# Patient Record
Sex: Male | Born: 1960 | Race: White | Hispanic: No | Marital: Single | Smoking: Former smoker
Health system: Southern US, Community
[De-identification: ages and names within clinical notes are randomized; demographics above are authoritative.]

## PROBLEM LIST (undated history)

## (undated) DIAGNOSIS — F17200 Nicotine dependence, unspecified, uncomplicated: Secondary | ICD-10-CM

## (undated) DIAGNOSIS — J449 Chronic obstructive pulmonary disease, unspecified: Secondary | ICD-10-CM

## (undated) DIAGNOSIS — G4730 Sleep apnea, unspecified: Secondary | ICD-10-CM

## (undated) DIAGNOSIS — I10 Essential (primary) hypertension: Secondary | ICD-10-CM

## (undated) DIAGNOSIS — I509 Heart failure, unspecified: Secondary | ICD-10-CM

## (undated) HISTORY — PX: WRIST SURGERY: SHX841

## (undated) HISTORY — PX: OTHER SURGICAL HISTORY: SHX169

---

## 2015-08-01 DIAGNOSIS — I509 Heart failure, unspecified: Secondary | ICD-10-CM

## 2015-08-01 HISTORY — DX: Heart failure, unspecified: I50.9

## 2015-08-13 ENCOUNTER — Inpatient Hospital Stay (HOSPITAL_COMMUNITY): Payer: Self-pay

## 2015-08-13 ENCOUNTER — Inpatient Hospital Stay (HOSPITAL_BASED_OUTPATIENT_CLINIC_OR_DEPARTMENT_OTHER)
Admission: EM | Admit: 2015-08-13 | Discharge: 2015-08-20 | DRG: 291 | Disposition: A | Discharge status: Home / Self Care | Payer: Self-pay | Attending: Internal Medicine | Admitting: Internal Medicine

## 2015-08-13 ENCOUNTER — Emergency Department (HOSPITAL_BASED_OUTPATIENT_CLINIC_OR_DEPARTMENT_OTHER): Payer: Self-pay

## 2015-08-13 ENCOUNTER — Encounter (HOSPITAL_BASED_OUTPATIENT_CLINIC_OR_DEPARTMENT_OTHER): Payer: Self-pay

## 2015-08-13 DIAGNOSIS — I272 Other secondary pulmonary hypertension: Secondary | ICD-10-CM | POA: Diagnosis present

## 2015-08-13 DIAGNOSIS — I509 Heart failure, unspecified: Secondary | ICD-10-CM | POA: Diagnosis present

## 2015-08-13 DIAGNOSIS — J9601 Acute respiratory failure with hypoxia: Secondary | ICD-10-CM | POA: Diagnosis present

## 2015-08-13 DIAGNOSIS — M7989 Other specified soft tissue disorders: Secondary | ICD-10-CM | POA: Diagnosis present

## 2015-08-13 DIAGNOSIS — J9811 Atelectasis: Secondary | ICD-10-CM | POA: Diagnosis present

## 2015-08-13 DIAGNOSIS — N5089 Other specified disorders of the male genital organs: Secondary | ICD-10-CM | POA: Diagnosis present

## 2015-08-13 DIAGNOSIS — I5030 Unspecified diastolic (congestive) heart failure: Secondary | ICD-10-CM

## 2015-08-13 DIAGNOSIS — R0600 Dyspnea, unspecified: Secondary | ICD-10-CM

## 2015-08-13 DIAGNOSIS — J449 Chronic obstructive pulmonary disease, unspecified: Secondary | ICD-10-CM | POA: Diagnosis present

## 2015-08-13 DIAGNOSIS — F17210 Nicotine dependence, cigarettes, uncomplicated: Secondary | ICD-10-CM | POA: Diagnosis present

## 2015-08-13 DIAGNOSIS — R7989 Other specified abnormal findings of blood chemistry: Secondary | ICD-10-CM

## 2015-08-13 DIAGNOSIS — F17200 Nicotine dependence, unspecified, uncomplicated: Secondary | ICD-10-CM | POA: Diagnosis present

## 2015-08-13 DIAGNOSIS — I5032 Chronic diastolic (congestive) heart failure: Secondary | ICD-10-CM

## 2015-08-13 DIAGNOSIS — Z79899 Other long term (current) drug therapy: Secondary | ICD-10-CM

## 2015-08-13 DIAGNOSIS — I50811 Acute right heart failure: Secondary | ICD-10-CM

## 2015-08-13 DIAGNOSIS — I472 Ventricular tachycardia: Secondary | ICD-10-CM | POA: Diagnosis present

## 2015-08-13 DIAGNOSIS — Z881 Allergy status to other antibiotic agents status: Secondary | ICD-10-CM

## 2015-08-13 DIAGNOSIS — Z8249 Family history of ischemic heart disease and other diseases of the circulatory system: Secondary | ICD-10-CM

## 2015-08-13 DIAGNOSIS — I878 Other specified disorders of veins: Secondary | ICD-10-CM | POA: Diagnosis present

## 2015-08-13 DIAGNOSIS — E662 Morbid (severe) obesity with alveolar hypoventilation: Secondary | ICD-10-CM | POA: Diagnosis present

## 2015-08-13 DIAGNOSIS — Z885 Allergy status to narcotic agent status: Secondary | ICD-10-CM

## 2015-08-13 DIAGNOSIS — G4730 Sleep apnea, unspecified: Secondary | ICD-10-CM | POA: Diagnosis present

## 2015-08-13 DIAGNOSIS — I110 Hypertensive heart disease with heart failure: Principal | ICD-10-CM | POA: Diagnosis present

## 2015-08-13 DIAGNOSIS — R0902 Hypoxemia: Secondary | ICD-10-CM

## 2015-08-13 DIAGNOSIS — I2781 Cor pulmonale (chronic): Secondary | ICD-10-CM | POA: Diagnosis present

## 2015-08-13 DIAGNOSIS — I2510 Atherosclerotic heart disease of native coronary artery without angina pectoris: Secondary | ICD-10-CM | POA: Diagnosis present

## 2015-08-13 DIAGNOSIS — J9612 Chronic respiratory failure with hypercapnia: Secondary | ICD-10-CM

## 2015-08-13 DIAGNOSIS — J9691 Respiratory failure, unspecified with hypoxia: Secondary | ICD-10-CM | POA: Diagnosis present

## 2015-08-13 DIAGNOSIS — I493 Ventricular premature depolarization: Secondary | ICD-10-CM | POA: Diagnosis present

## 2015-08-13 DIAGNOSIS — Z6841 Body Mass Index (BMI) 40.0 and over, adult: Secondary | ICD-10-CM

## 2015-08-13 DIAGNOSIS — J9611 Chronic respiratory failure with hypoxia: Secondary | ICD-10-CM | POA: Insufficient documentation

## 2015-08-13 DIAGNOSIS — R778 Other specified abnormalities of plasma proteins: Secondary | ICD-10-CM

## 2015-08-13 DIAGNOSIS — I4729 Other ventricular tachycardia: Secondary | ICD-10-CM

## 2015-08-13 HISTORY — DX: Sleep apnea, unspecified: G47.30

## 2015-08-13 HISTORY — DX: Nicotine dependence, unspecified, uncomplicated: F17.200

## 2015-08-13 HISTORY — DX: Morbid (severe) obesity due to excess calories: E66.01

## 2015-08-13 HISTORY — DX: Heart failure, unspecified: I50.9

## 2015-08-13 LAB — CBC WITH DIFFERENTIAL/PLATELET
BASOS ABS: 0 10*3/uL (ref 0.0–0.1)
BASOS ABS: 0 10*3/uL (ref 0.0–0.1)
Basophils Relative: 0 %
Basophils Relative: 0 %
EOS PCT: 1 %
Eosinophils Absolute: 0.1 10*3/uL (ref 0.0–0.7)
Eosinophils Absolute: 0.1 10*3/uL (ref 0.0–0.7)
Eosinophils Relative: 2 %
HCT: 55.2 % — ABNORMAL HIGH (ref 39.0–52.0)
HEMATOCRIT: 57.1 % — AB (ref 39.0–52.0)
HEMOGLOBIN: 17.2 g/dL — AB (ref 13.0–17.0)
HEMOGLOBIN: 17.7 g/dL — AB (ref 13.0–17.0)
LYMPHS ABS: 1.1 10*3/uL (ref 0.7–4.0)
LYMPHS PCT: 14 %
LYMPHS PCT: 22 %
Lymphs Abs: 1.8 10*3/uL (ref 0.7–4.0)
MCH: 30.1 pg (ref 26.0–34.0)
MCH: 29.8 pg (ref 26.0–34.0)
MCHC: 31.2 g/dL (ref 30.0–36.0)
MCHC: 31 g/dL (ref 30.0–36.0)
MCV: 96.7 fL (ref 78.0–100.0)
MCV: 96.1 fL (ref 78.0–100.0)
MONO ABS: 0.8 10*3/uL (ref 0.1–1.0)
MONOS PCT: 10 %
Monocytes Absolute: 0.7 10*3/uL (ref 0.1–1.0)
Monocytes Relative: 10 %
NEUTROS ABS: 5.3 10*3/uL (ref 1.7–7.7)
NEUTROS PCT: 75 %
NEUTROS PCT: 66 %
Neutro Abs: 5.8 10*3/uL (ref 1.7–7.7)
PLATELETS: 166 10*3/uL (ref 150–400)
Platelets: 148 10*3/uL — ABNORMAL LOW (ref 150–400)
RBC: 5.71 MIL/uL (ref 4.22–5.81)
RBC: 5.94 MIL/uL — ABNORMAL HIGH (ref 4.22–5.81)
RDW: 16.3 % — ABNORMAL HIGH (ref 11.5–15.5)
RDW: 15.7 % — AB (ref 11.5–15.5)
WBC: 7.7 10*3/uL (ref 4.0–10.5)
WBC: 8.1 10*3/uL (ref 4.0–10.5)

## 2015-08-13 LAB — URINALYSIS, ROUTINE W REFLEX MICROSCOPIC
Bilirubin Urine: NEGATIVE
Glucose, UA: NEGATIVE mg/dL
Hgb urine dipstick: NEGATIVE
Ketones, ur: NEGATIVE mg/dL
LEUKOCYTES UA: NEGATIVE
Nitrite: NEGATIVE
SPECIFIC GRAVITY, URINE: 1.021 (ref 1.005–1.030)
pH: 6.5 (ref 5.0–8.0)

## 2015-08-13 LAB — BRAIN NATRIURETIC PEPTIDE
B Natriuretic Peptide: 340.4 pg/mL — ABNORMAL HIGH (ref 0.0–100.0)
B Natriuretic Peptide: 224.6 pg/mL — ABNORMAL HIGH (ref 0.0–100.0)

## 2015-08-13 LAB — URINE MICROSCOPIC-ADD ON
RBC / HPF: NONE SEEN RBC/hpf (ref 0–5)
WBC UA: NONE SEEN WBC/hpf (ref 0–5)

## 2015-08-13 LAB — COMPREHENSIVE METABOLIC PANEL
ALBUMIN: 3.2 g/dL — AB (ref 3.5–5.0)
ALT: 16 U/L — ABNORMAL LOW (ref 17–63)
ALT: 17 U/L (ref 17–63)
ANION GAP: 7 (ref 5–15)
AST: 18 U/L (ref 15–41)
AST: 19 U/L (ref 15–41)
Albumin: 3.4 g/dL — ABNORMAL LOW (ref 3.5–5.0)
Alkaline Phosphatase: 59 U/L (ref 38–126)
Alkaline Phosphatase: 62 U/L (ref 38–126)
Anion gap: 9 (ref 5–15)
BILIRUBIN TOTAL: 0.8 mg/dL (ref 0.3–1.2)
BUN: 16 mg/dL (ref 6–20)
BUN: 11 mg/dL (ref 6–20)
CHLORIDE: 100 mmol/L — AB (ref 101–111)
CHLORIDE: 96 mmol/L — AB (ref 101–111)
CO2: 34 mmol/L — ABNORMAL HIGH (ref 22–32)
CO2: 34 mmol/L — AB (ref 22–32)
CREATININE: 0.96 mg/dL (ref 0.61–1.24)
Calcium: 8.6 mg/dL — ABNORMAL LOW (ref 8.9–10.3)
Calcium: 8.9 mg/dL (ref 8.9–10.3)
Creatinine, Ser: 0.93 mg/dL (ref 0.61–1.24)
GFR calc Af Amer: >60 mL/min (ref >60)
GFR calc non Af Amer: >60 mL/min (ref >60)
Glucose, Bld: 97 mg/dL (ref 65–99)
Glucose, Bld: 86 mg/dL (ref 65–99)
POTASSIUM: 4.1 mmol/L (ref 3.5–5.1)
Potassium: 3.7 mmol/L (ref 3.5–5.1)
SODIUM: 139 mmol/L (ref 135–145)
Sodium: 141 mmol/L (ref 135–145)
TOTAL PROTEIN: 6.7 g/dL (ref 6.5–8.1)
Total Bilirubin: 1.1 mg/dL (ref 0.3–1.2)
Total Protein: 6.7 g/dL (ref 6.5–8.1)

## 2015-08-13 LAB — PROTIME-INR
INR: 1.16 (ref 0.00–1.49)
Prothrombin Time: 15 seconds (ref 11.6–15.2)

## 2015-08-13 LAB — TSH: TSH: 4.308 u[IU]/mL (ref 0.350–4.500)

## 2015-08-13 LAB — MAGNESIUM: Magnesium: 1.1 mg/dL — ABNORMAL LOW (ref 1.7–2.4)

## 2015-08-13 LAB — CK TOTAL AND CKMB (NOT AT ARMC)
CK TOTAL: 73 U/L (ref 49–397)
CK, MB: 3.6 ng/mL (ref 0.5–5.0)
Relative Index: INVALID (ref 0.0–2.5)

## 2015-08-13 LAB — TROPONIN I
TROPONIN I: 0.07 ng/mL — AB (ref <0.031)
TROPONIN I: 0.07 ng/mL — AB (ref <0.031)

## 2015-08-13 LAB — PHOSPHORUS: Phosphorus: 3.7 mg/dL (ref 2.5–4.6)

## 2015-08-13 MED ORDER — SODIUM CHLORIDE 0.9% FLUSH
3.0000 mL | INTRAVENOUS | Status: DC | PRN
Start: 2015-08-13 — End: 2015-08-20

## 2015-08-13 MED ORDER — FUROSEMIDE 10 MG/ML IJ SOLN
40.0000 mg | INTRAMUSCULAR | Status: AC
  Administered 2015-08-13: 40 mg via INTRAVENOUS
  Filled 2015-08-13: qty 4

## 2015-08-13 MED ORDER — ASPIRIN EC 81 MG PO TBEC
81.0000 mg | DELAYED_RELEASE_TABLET | Freq: Every day | ORAL | Status: DC
  Administered 2015-08-13 – 2015-08-20 (×8): 81 mg via ORAL
  Filled 2015-08-13 (×8): qty 1

## 2015-08-13 MED ORDER — HEPARIN SODIUM (PORCINE) 5000 UNIT/ML IJ SOLN
5000.0000 [IU] | Freq: Three times a day (TID) | INTRAMUSCULAR | Status: DC
  Administered 2015-08-13 – 2015-08-16 (×7): 5000 [IU] via SUBCUTANEOUS
  Filled 2015-08-13 (×7): qty 1

## 2015-08-13 MED ORDER — FUROSEMIDE 10 MG/ML IJ SOLN
40.0000 mg | Freq: Two times a day (BID) | INTRAMUSCULAR | Status: DC
  Administered 2015-08-13 – 2015-08-15 (×4): 40 mg via INTRAVENOUS
  Filled 2015-08-13 (×4): qty 4

## 2015-08-13 MED ORDER — ASPIRIN 81 MG PO CHEW
324.0000 mg | CHEWABLE_TABLET | Freq: Once | ORAL | Status: AC
  Administered 2015-08-13: 324 mg via ORAL
  Filled 2015-08-13: qty 4

## 2015-08-13 MED ORDER — IPRATROPIUM-ALBUTEROL 0.5-2.5 (3) MG/3ML IN SOLN
3.0000 mL | Freq: Once | RESPIRATORY_TRACT | Status: AC
  Administered 2015-08-13: 3 mL via RESPIRATORY_TRACT
  Filled 2015-08-13: qty 3

## 2015-08-13 MED ORDER — SODIUM CHLORIDE 0.9% FLUSH
3.0000 mL | Freq: Two times a day (BID) | INTRAVENOUS | Status: DC
  Administered 2015-08-13 – 2015-08-19 (×9): 3 mL via INTRAVENOUS

## 2015-08-13 MED ORDER — SODIUM CHLORIDE 0.9% FLUSH
3.0000 mL | Freq: Two times a day (BID) | INTRAVENOUS | Status: DC
  Administered 2015-08-13 – 2015-08-20 (×9): 3 mL via INTRAVENOUS

## 2015-08-13 MED ORDER — IPRATROPIUM-ALBUTEROL 0.5-2.5 (3) MG/3ML IN SOLN: 3.0000 mL | RESPIRATORY_TRACT | Status: DC | PRN

## 2015-08-13 MED ORDER — IPRATROPIUM-ALBUTEROL 0.5-2.5 (3) MG/3ML IN SOLN
3.0000 mL | Freq: Four times a day (QID) | RESPIRATORY_TRACT | Status: DC
  Administered 2015-08-13 – 2015-08-15 (×6): 3 mL via RESPIRATORY_TRACT
  Filled 2015-08-13 (×7): qty 3

## 2015-08-13 MED ORDER — SODIUM CHLORIDE 0.9 % IV SOLN: 250.0000 mL | INTRAVENOUS | Status: DC | PRN

## 2015-08-13 NOTE — ED Provider Notes (Signed)
CSN: [id number]     Arrival date & time 08/13/15  1136 History   First MD Initiated Contact with Patient 08/13/15 1152     Chief Complaint  Patient presents with  . Leg Swelling    Kirk Lucas is a 55 y.o. male who presents to the emergency department complaining of bilateral leg swelling and increasing shortness of breath ongoing for the past 2-1/2 weeks. Patient reports he's had increased productive cough, dyspnea on exertion and bilateral leg swelling over the past 2 and half weeks. He also reports he's been having scrotal swelling without pain. He denies any pain to his penis or his testicles. He denies any urinary symptoms. Patient reports his weight here today is about 25 pounds heavier than he has ever seen it. Patient smoked for 37 years and quit several weeks ago. He is not followed by a primary care doctor and has never seen a cardiologist. He denies history of CHF, COPD, hyperlipidemia hypertension. He reports a family history of his mom and dad with heart attacks and heart failure. Patient denies fevers, hemoptysis, chest pain, abdominal pain, nausea, vomiting, diarrhea, rashes, penile pain, testicular pain, urinary symptoms.   The history is provided by the patient. No language interpreter was used.    History reviewed. No pertinent past medical history. Past Surgical History  Procedure Laterality Date  . Arm surgery    . Wrist surgery     History reviewed. No pertinent family history. Social History  Substance Use Topics  . Smoking status: Current Every Day Smoker  . Smokeless tobacco: None  . Alcohol Use: No    Review of Systems  Constitutional: Negative for fever and chills.  HENT: Negative for congestion and sore throat.   Eyes: Negative for visual disturbance.  Respiratory: Positive for cough, shortness of breath and wheezing.   Cardiovascular: Positive for leg swelling. Negative for chest pain and palpitations.  Gastrointestinal: Negative for nausea, vomiting,  abdominal pain and diarrhea.  Genitourinary: Positive for scrotal swelling. Negative for dysuria, urgency, frequency, hematuria, decreased urine volume, discharge, penile swelling, difficulty urinating, genital sores, penile pain and testicular pain.  Musculoskeletal: Negative for back pain and neck pain.  Skin: Negative for rash.  Neurological: Negative for headaches.      Allergies  Duricef  Home Medications   Prior to Admission medications   Medication Sig Start Date End Date Taking? Authorizing Provider  Amoxicillin (AMOXIL PO) Take by mouth.   Yes Historical Provider, MD  IBUPROFEN IB PO Take by mouth.   Yes Historical Provider, MD   BP 133/89 mmHg  Pulse 78  Temp(Src) 99.1 F (37.3 C) (Oral)  Resp 22  Ht 6' (1.829 m)  Wt 157.398 kg  BMI 47.05 kg/m2  SpO2 93% Physical Exam  Constitutional: He appears well-developed and well-nourished. No distress.  Nontoxic appearing. Obese male.  HENT:  Head: Normocephalic and atraumatic.  Mouth/Throat: Oropharynx is clear and moist.  Eyes: Conjunctivae are normal. Pupils are equal, round, and reactive to light. Right eye exhibits no discharge. Left eye exhibits no discharge.  Neck: Neck supple. No JVD present.  Cardiovascular: Normal rate, regular rhythm, normal heart sounds and intact distal pulses.  Exam reveals no gallop and no friction rub.   No murmur heard. Bilateral radial and dorsalis pedis pulses are palpable. Good capillary refill to his bilateral distal toes.  Pulmonary/Chest: Effort normal. No stridor. No respiratory distress. He has wheezes. He has no rales.  Diminished lung sounds his bilateral bases with scattered wheezing  diffusely. No increased work of breathing. Patient on oxygen via nasal cannula during my exam. Oxygen saturation 92% on 4 L via nasal cannula.  Abdominal: Soft. There is no tenderness. There is no guarding.  Abdomen is soft nontender palpation.  Genitourinary:  Edema noted diffusely to his scrotum.  No testicular tenderness to palpation. No tenderness of his scrotum. No scrotal erythema. No penile discharge.  Musculoskeletal: He exhibits edema.  Bilateral pitting lower extremity edema. No calf tenderness to palpation.    Lymphadenopathy:    He has no cervical adenopathy.  Neurological: He is alert. Coordination normal.  Skin: Skin is warm and dry. No rash noted. He is not diaphoretic. No erythema. No pallor.  Psychiatric: He has a normal mood and affect. His behavior is normal.  Nursing note and vitals reviewed.   ED Course  Procedures (including critical care time) Labs Review Labs Reviewed  COMPREHENSIVE METABOLIC PANEL - Abnormal; Notable for the following:    Chloride 100 (*)    CO2 34 (*)    Calcium 8.6 (*)    Albumin 3.4 (*)    ALT 16 (*)    All other components within normal limits  CBC WITH DIFFERENTIAL/PLATELET - Abnormal; Notable for the following:    Hemoglobin 17.2 (*)    HCT 55.2 (*)    RDW 16.3 (*)    All other components within normal limits  URINALYSIS, ROUTINE W REFLEX MICROSCOPIC (NOT AT [facility]) - Abnormal; Notable for the following:    Protein, ur >300 (*)    All other components within normal limits  TROPONIN I - Abnormal; Notable for the following:    Troponin I 0.07 (*)    All other components within normal limits  BRAIN NATRIURETIC PEPTIDE - Abnormal; Notable for the following:    B Natriuretic Peptide 340.4 (*)    All other components within normal limits  URINE MICROSCOPIC-ADD ON - Abnormal; Notable for the following:    Squamous Epithelial / LPF 0-5 (*)    Bacteria, UA MANY (*)    All other components within normal limits    Imaging Review Dg Chest 2 View  08/13/2015  CLINICAL DATA:  Cough and shortness of breath for 2 weeks EXAM: CHEST  2 VIEW COMPARISON:  None. FINDINGS: There is no edema or consolidation. Heart is upper normal in size with pulmonary vascularity within normal limits. No adenopathy. No pneumothorax. No bone lesions.  IMPRESSION: No edema or consolidation. Electronically Signed   By: Bretta Bang III M.D.   On: 08/13/2015 12:51   I have personally reviewed and evaluated these images and lab results as part of my medical decision-making.   EKG Interpretation   Date/Time:  Tuesday August 13 2015 11:55:46 EDT Ventricular Rate:  83 PR Interval:  156 QRS Duration: 83 QT Interval:  369 QTC Calculation: 434 R Axis:   102 Text Interpretation:  Sinus rhythm Right axis deviation Nonspecific T  abnormalities, anterior leads Baseline wander in lead(s) II III aVF  Confirmed by Fayrene Fearing  MD, MARK ([id number]) on 08/13/2015 12:00:14 PM Also  confirmed by Fayrene Fearing  MD, MARK ([id number]), editor [place]  CCT, BEVERLY  ([id number])  on 08/13/2015 12:44:15 PM     Filed Vitals:   08/13/15 1330 08/13/15 1345 08/13/15 1400 08/13/15 1415  BP:  130/92 132/85 133/89  Pulse: 79 77 79 78  Temp:      TempSrc:      Resp: Height:      Weight:  SpO2: 96% 95% 94% 93%    MDM   Meds given in ED:  Medications  ipratropium-albuterol (DUONEB) 0.5-2.5 (3) MG/3ML nebulizer solution 3 mL (3 mLs Nebulization Given 08/13/15 1224)  aspirin chewable tablet 324 mg (324 mg Oral Given 08/13/15 1328)  furosemide (LASIX) injection 40 mg (40 mg Intravenous Given 08/13/15 1412)    New Prescriptions   No medications on file    Final diagnoses:  Acute congestive heart failure, unspecified congestive heart failure type (HCC)  Hypoxia  Elevated troponin   This is a 55 y.o. male Who presents to the emergency department complaining of bilateral leg swelling and increasing shortness of breath ongoing for the past 2-1/2 weeks. Patient reports he's had increased productive cough, dyspnea on exertion and bilateral leg swelling over the past 2 and half weeks. He also reports he's been having scrotal swelling without pain. He denies any pain to his penis or his testicles. He denies any urinary symptoms. Patient reports his weight here today is  about 25 pounds heavier than he has ever seen it. Patient smoked for 37 years and quit several weeks ago. He is not followed by a primary care doctor and has never seen a cardiologist.  On exam patient is afebrile and nontoxic-appearing. He has bilateral pitting lower extremity edema. He has scattered wheezes and diminished lung sounds bilaterally. His oxygen saturation is 92% on 4 L via nasal cannula. He does not use oxygen at home. EKG shows no STEMI. Scrotal exam is remarkable for edema. No tenderness to palpation. I suspect this is related to new onset CHF. CMP shows preserved kidney function with a creatinine of 0.93. CBC shows no leukocytosis. Patient's troponin is mildly elevated at 0.07. BNP is elevated at 340. I suspect this patient's elevated troponin is related to his new onset CHF. Patient denies any chest pain. No STEMI on EKG. Chest x-ray is unremarkable. Patient provided with aspirin and Lasix. Plan for admission due to patient's hypoxia and new onset CHF. The patient is in agreement with admission.   I consulted with Dr. Margot Ables who accepted the patient for admission and requested telemetry temporary admission orders.  This patient was discussed with Dr. Fayrene Fearing who agrees with assessment and plan.    Everlene Farrier, PA-C 08/13/15 1427  Rolland Porter, MD 08/24/15 1339

## 2015-08-13 NOTE — ED Notes (Signed)
Attempted report to tiffany rn on floor, currently with call will return call in 10-15 min

## 2015-08-13 NOTE — ED Notes (Signed)
C/o bilat LE swelling x 1-1.5 years, scrotal swelling x 3-4 days-NAD-steady gait

## 2015-08-13 NOTE — Progress Notes (Signed)
[facility] and evaluated for shortness of breath found to be hypoxic. Patient does not see a physician on a regular basis and has no PCP who does not take any home medications. Patient found to be in CHF. Required 4 L nasal cannula. Weight increased of 25 pounds over the last 2-1/2 weeks. Patient started on IV Lasix 40 mg. Additionally patient with elevated troponin which will need to be monitored. Patient except to telemetry observation bed. Patient will need typical CHF workup and cycling of troponins.    Shelly Flattenavid Guinevere Stephenson, MD [facility] 08/13/2015, 2:20 PM

## 2015-08-13 NOTE — ED Notes (Signed)
Pt with no reps distress-O2 sat low 80s-when asked, pt states he has been SOB x 2 weeks-when notified hid O2 is low he states "i've heard that before"-taken to tx area via w/c

## 2015-08-13 NOTE — H&P (Signed)
History and Physical    Kirk Lucas ZOX:[id number] DOB: 16-Sep-1960 DOA: 08/13/2015  PCP: No PCP Per Patient (Confirm with patient/family/NH records and if not entered, this has to be entered at [facility] point of entry) Patient coming from: Primary care physician office as a direct admission  Chief Complaint: Shortness of breath  HPI: Kirk Lucas is a 55 y.o. male who comes to the hospital sent as a direct admission from his primary care physician office. For last 2 weeks approximately patient has been developing shortness of breath, it has been progressive to the point where he is dyspneic with minimal efforts, there is no improvement factors, has been associated with decreased level of energy, cough, wheezing, difficulty sleeping, lower extremity edema and for last 24 hours severe scrotal edema. His symptoms had mild improvement with the use of nebulizer treatments. Denies any chest pain or angina symptoms, until recently patient was able to do his routine activities without dyspnea but going beyond his routine activities did provoke him dyspnea .  Due to his worsening symptoms he presented to his primary care physician where he was noted to be hypoxic and he was referred to the hospital for further evaluation.  ED Course: None  Review of Systems: As per HPI otherwise 10 point review of systems negative.  Gen. Positive for decreased level and energy and decreased surgeon functional capacity Cardiovascular. Denies frank PND or  orthopnea but he does mention as noted in history present illness, extremity edema and scrotal edema. Pulmonary. Positive for shortness of breath, cough and wheezing as mentioned in history present illness. Gastrointestinal. Denies any nausea, vomiting, diarrhea. Musculoskeletal no significant joint pain Dermatology no rashes Endocrinology. No tremors heat or cold intolerance Neurology no seizures or paresthesias Urology no dysuria or increased urinary frequency Psych  no depression or anxiety  Past family history. Positive for heart disease and pulmonary disease in his father.  Social history. Patient continued to smoke about 1 pack per day for many years, alcohol or illegal drug abuse.  Past Surgical History  Procedure Laterality Date  . Arm surgery    . Wrist surgery       reports that he has been smoking.  He does not have any smokeless tobacco history on file. He reports that he does not drink alcohol or use illicit drugs.  Allergies  Allergen Reactions  . Codeine Nausea Only  . Duricef [Cefadroxil] Itching    History reviewed. No pertinent family history. History see above   Prior to Admission medications   Medication Sig Start Date End Date Taking? Authorizing Provider  amoxicillin (AMOXIL) 500 MG capsule Take 500 mg by mouth 3 (three) times daily. For 10 days 08/09/15  Yes Historical Provider, MD  hydrocortisone cream 1 % Apply 1 application topically as needed (for rashy areas).   Yes Historical Provider, MD  ibuprofen (ADVIL,MOTRIN) 200 MG tablet Take 800 mg by mouth every 6 (six) hours as needed for headache or moderate pain.   Yes Historical Provider, MD  oxymetazoline (AFRIN) 0.05 % nasal spray Place 1-3 sprays into both nostrils 2 (two) times daily as needed for congestion.   Yes Historical Provider, MD    Physical Exam: Filed Vitals:   08/13/15 1500 08/13/15 1515 08/13/15 1530 08/13/15 1714  BP: 130/90 132/95 124/85 146/100  Pulse: 71 81 74 74  Temp:    97.9 F (36.6 C)  TempSrc:    Oral  Resp: Height:    6' (1.829 m)  Weight:    154.404 kg (340 lb 6.4 oz)  SpO2: 93% 91% 91% 91%      Constitutional: NAD, calm, comfortable Filed Vitals:   08/13/15 1500 08/13/15 1515 08/13/15 1530 08/13/15 1714  BP: 130/90 132/95 124/85 146/100  Pulse: 71 81 74 74  Temp:    97.9 F (36.6 C)  TempSrc:    Oral  Resp: 13 16 19 18   Height:    6' (1.829 m)  Weight:    154.404 kg (340 lb 6.4 oz)  SpO2: 93% 91% 91% 91%    Eyes: PERRL, lids and conjunctivae Mild pallor ENMT: Mucous membranes are moist. Posterior pharynx clear of any exudate or lesions.Normal dentition.  Neck: normal, supple, no masses, no thyromegaly, no JVD Respiratory: Decreased breath sounds bilaterally with poor air movement, positive expiratory wheezing, no crackles. No accessory muscle use.  Cardiovascular: Regular rate and rhythm, distant, no murmurs / rubs / gallops. Positive lower extremity edema to 3+ pitting.. 2+ pedal pulses. No carotid bruits.  Abdomen: no tenderness, no masses palpated. No hepatosplenomegaly. Bowel sounds positive.  Musculoskeletal: no clubbing / cyanosis. No joint deformity upper and lower extremities. Good ROM, no contractures. Normal muscle tone.  Skin: no rashes, lesions, ulcers. No induration, mild erythema lower extremities symmetrically. Neurologic: CN 2-12 grossly intact. Sensation intact, DTR normal. Strength 5/5 in all 4.  Psychiatric: Normal judgment and insight. Alert and oriented x 3. Normal mood.  Genitalia. Severe scrotal edema bilaterally, nontender, no increased local temperature, mild erythema.   Labs on Admission: I have personally reviewed following labs and imaging studies  CBC:  Recent Labs Lab 08/13/15 1229  WBC 7.7  NEUTROABS 5.8  HGB 17.2*  HCT 55.2*  MCV 96.7  PLT 166   Basic Metabolic Panel:  Recent Labs Lab 08/13/15 1229  NA 141  K 4.1  CL 100*  CO2 34*  GLUCOSE 97  BUN 16  CREATININE 0.93  CALCIUM 8.6*   GFR: Estimated Creatinine Clearance: 140.7 mL/min (by C-G formula based on Cr of 0.93). Liver Function Tests:  Recent Labs Lab 08/13/15 1229  AST 18  ALT 16*  ALKPHOS 59  BILITOT 0.8  PROT 6.7  ALBUMIN 3.4*   No results for input(s): LIPASE, AMYLASE in the last 168 hours. No results for input(s): AMMONIA in the last 168 hours. Coagulation Profile: No results for input(s): INR, PROTIME in the last 168 hours. Cardiac Enzymes:  Recent Labs Lab  08/13/15 1229  TROPONINI 0.07*   BNP (last 3 results) No results for input(s): PROBNP in the last 8760 hours. HbA1C: No results for input(s): HGBA1C in the last 72 hours. CBG: No results for input(s): GLUCAP in the last 168 hours. Lipid Profile: No results for input(s): CHOL, HDL, LDLCALC, TRIG, CHOLHDL, LDLDIRECT in the last 72 hours. Thyroid Function Tests: No results for input(s): TSH, T4TOTAL, FREET4, T3FREE, THYROIDAB in the last 72 hours. Anemia Panel: No results for input(s): VITAMINB12, FOLATE, FERRITIN, TIBC, IRON, RETICCTPCT in the last 72 hours. Urine analysis:    Component Value Date/Time   COLORURINE YELLOW 08/13/2015 1300   APPEARANCEUR CLEAR 08/13/2015 1300   LABSPEC 1.021 08/13/2015 1300   PHURINE 6.5 08/13/2015 1300   GLUCOSEU NEGATIVE 08/13/2015 1300   HGBUR NEGATIVE 08/13/2015 1300   BILIRUBINUR NEGATIVE 08/13/2015 1300   KETONESUR NEGATIVE 08/13/2015 1300   PROTEINUR >300* 08/13/2015 1300   NITRITE NEGATIVE 08/13/2015 1300   LEUKOCYTESUR NEGATIVE 08/13/2015 1300   Sepsis Labs: !!!!!!!!!!!!!!!!!!!!!!!!!!!!!!!!!!!!!!!!!!!! @LABRCNTIP (procalcitonin:4,lacticidven:4) )No results found for this or any  previous visit (from the past 240 hour(s)).   Radiological Exams on Admission: Dg Chest 2 View  08/13/2015  CLINICAL DATA:  Cough and shortness of breath for 2 weeks EXAM: CHEST  2 VIEW COMPARISON:  None. FINDINGS: There is no edema or consolidation. Heart is upper normal in size with pulmonary vascularity within normal limits. No adenopathy. No pneumothorax. No bone lesions. IMPRESSION: No edema or consolidation. Electronically Signed   By: Bretta BangWilliam  Woodruff III M.D.   On: 08/13/2015 12:51    EKG: Personally reviewed, sinus rhythm, right axis deviation, positive T-wave inversions in the precordial leads.  Chest x-ray personally reviewed showing mild right-sided rotation, good inflation, good penetration, increased vascular markings bilaterally with prominent  hilar regions, no other infiltrates, no effusions, no signs of pneumothorax.  Assessment/Plan Active Problems:   CHF (congestive heart failure) (HCC)   Heart failure (HCC)   This is a 55 year old gentleman who has a chronic tobacco abuse, otherwise has not been diagnosed with any formal cardiopulmonary condition. For last 2 weeks and probably more he has been developing dyspnea that has progressively worsening to the point where he is symptomatic with minimal efforts, this has been associated with signs of volume overload including wheezing, lower extremities edema and scrotal edema. Denies any angina. On the physical examination his hypervolemic even though he has received already furosemide, he does have wheezing, scrotal edema, lower extremities edema. His creatinine is 0.93, potassium 4.1 and sodium 141, his white cell count 7.7 with hemoglobin 17.2. His EKG showing sinus rhythm with a right axis deviation and precordial T-wave inversions, his troponin is mildly elevated at 0.07, his chest film showing vascular congestion bilaterally.  Working diagnosis: Volume overload suspected to be related to the new diagnosis of heart failure.  1. Cardiovascular. Patient will be diuresis with furosemide 20 g intravenously twice daily, strict in and out's and daily weights, fluid restriction 1200 per 24 hours. Patient will have serial troponins, serial EKGs, will rule out for acute coronary syndrome. We'll do an echocardiogram to assess any structural heart disease.  2. Pulmonary. Patient has signs and symptoms of pulmonary edema probably cardiac in origin. Patient will be diuresis with furosemide. Continue oximetry monitor, supplemental oxygen per nasal cannula to target O2 sat duration more than 92%. Diuresis as above. Will add bronchodilator therapy with DuoNeb.  3. Nephrology. Patient's kidney function is stable, patient will have his electrolytes and creatinine follow closely. Patient will received  furosemide intravenously.  4. Neurology. Patient is awake and alert. Smoking cessation.  Patient is high risk of developing worsening signs of volume overload.  DVT prophylaxis: Lovenox 40 heparin Code Status: Full code Family Communication: na Disposition Plan: She'll discharged home when stable Admission status: Inpatient   Mauricio Annett Gulaaniel Arrien MD [facility] Pager [phone]- [phone].  If 7PM-7AM, please contact night-coverage [email or web address] Password [email or web address]  08/13/2015, 5:39 PM

## 2015-08-14 ENCOUNTER — Inpatient Hospital Stay (HOSPITAL_COMMUNITY): Payer: Self-pay

## 2015-08-14 DIAGNOSIS — R0600 Dyspnea, unspecified: Secondary | ICD-10-CM

## 2015-08-14 DIAGNOSIS — J9691 Respiratory failure, unspecified with hypoxia: Secondary | ICD-10-CM | POA: Diagnosis present

## 2015-08-14 LAB — CBC
HEMATOCRIT: 54.9 % — AB (ref 39.0–52.0)
HEMOGLOBIN: 16.5 g/dL (ref 13.0–17.0)
MCH: 29.2 pg (ref 26.0–34.0)
MCHC: 30.1 g/dL (ref 30.0–36.0)
MCV: 97 fL (ref 78.0–100.0)
Platelets: 149 10*3/uL — ABNORMAL LOW (ref 150–400)
RBC: 5.66 MIL/uL (ref 4.22–5.81)
RDW: 15.9 % — ABNORMAL HIGH (ref 11.5–15.5)
WBC: 7.8 10*3/uL (ref 4.0–10.5)

## 2015-08-14 LAB — CK TOTAL AND CKMB (NOT AT ARMC)
CK TOTAL: 101 U/L (ref 49–397)
CK TOTAL: 92 U/L (ref 49–397)
CK, MB: 1.7 ng/mL (ref 0.5–5.0)
CK, MB: 2 ng/mL (ref 0.5–5.0)
CK, MB: 2.3 ng/mL (ref 0.5–5.0)
CK, MB: 3.2 ng/mL (ref 0.5–5.0)
RELATIVE INDEX: INVALID (ref 0.0–2.5)
Relative Index: INVALID (ref 0.0–2.5)
Relative Index: 2 (ref 0.0–2.5)
Relative Index: INVALID (ref 0.0–2.5)
Total CK: 69 U/L (ref 49–397)
Total CK: 83 U/L (ref 49–397)

## 2015-08-14 LAB — ECHOCARDIOGRAM COMPLETE
CHL CUP TV REG PEAK VELOCITY: 304 cm/s
E/e' ratio: 7.3
EWDT: 180 ms
FS: 27 % — AB (ref 28–44)
Height: 72 in
IVS/LV PW RATIO, ED: 1.05
LA ID, A-P, ES: 44 mm
LADIAMINDEX: 1.66 cm/m2
LAVOLA4C: 31.7 mL
LEFT ATRIUM END SYS DIAM: 44 mm
LV PW d: 13.7 mm — AB (ref 0.6–1.1)
LV TDI E'MEDIAL: 10.8
LVEEAVG: 7.3
LVEEMED: 7.3
LVELAT: 10.3 cm/s
LVOT area: 3.46 cm2
LVOT diameter: 21 mm
MV Dec: 180
MV Peak grad: 2 mmHg
MV pk E vel: 75.2 m/s
MVPKAVEL: 71.3 m/s
TDI e' lateral: 10.3
TRMAXVEL: 304 cm/s
Weight: 5347.2 oz

## 2015-08-14 LAB — BLOOD GAS, ARTERIAL
Acid-Base Excess: 15.7 mmol/L — ABNORMAL HIGH (ref 0.0–2.0)
BICARBONATE: 41.1 meq/L — AB (ref 20.0–24.0)
Drawn by: 313061
FIO2: 0.21
O2 Saturation: 74.3 %
PATIENT TEMPERATURE: 98.6
PH ART: 7.424 (ref 7.350–7.450)
PO2 ART: 42.4 mmHg — AB (ref 80.0–100.0)
TCO2: 43 mmol/L (ref 0–100)
pCO2 arterial: 63.8 mmHg (ref 35.0–45.0)

## 2015-08-14 LAB — TROPONIN I
TROPONIN I: 0.07 ng/mL — AB (ref <0.031)
Troponin I: 0.07 ng/mL — ABNORMAL HIGH (ref <0.031)

## 2015-08-14 LAB — BASIC METABOLIC PANEL
ANION GAP: 10 (ref 5–15)
BUN: 10 mg/dL (ref 6–20)
CO2: 36 mmol/L — ABNORMAL HIGH (ref 22–32)
Calcium: 8.6 mg/dL — ABNORMAL LOW (ref 8.9–10.3)
Chloride: 93 mmol/L — ABNORMAL LOW (ref 101–111)
Creatinine, Ser: 0.97 mg/dL (ref 0.61–1.24)
GFR calc Af Amer: >60 mL/min (ref >60)
GLUCOSE: 133 mg/dL — AB (ref 65–99)
POTASSIUM: 3.8 mmol/L (ref 3.5–5.1)
SODIUM: 139 mmol/L (ref 135–145)

## 2015-08-14 MED ORDER — IOPAMIDOL (ISOVUE-370) INJECTION 76%
INTRAVENOUS | Status: AC
  Administered 2015-08-14: 100 mL via INTRAVENOUS
  Filled 2015-08-14: qty 100

## 2015-08-14 MED ORDER — PERFLUTREN LIPID MICROSPHERE
1.0000 mL | INTRAVENOUS | Status: AC | PRN
  Administered 2015-08-14: 3 mL via INTRAVENOUS
  Filled 2015-08-14: qty 10

## 2015-08-14 NOTE — Assessment & Plan Note (Signed)
Suspected acute on chronic diastolic, present on admission

## 2015-08-14 NOTE — Clinical Documentation Improvement (Addendum)
Hospitalist  Can the diagnosis of CHF be further specified?    Acuity - Acute, Chronic, Acute on Chronic   Type - Systolic, Diastolic, Systolic and Diastolic  Other  Clinically Undetermined   Document any associated diagnoses/conditions   Supporting Information: Newly diagnosed CHF per 6/13 progress notes. 6/13: BNP: 224.6.   Please exercise your independent, professional judgment when responding. A specific answer is not anticipated or expected.   Thank Sabino DonovanYou,  Wanda [facility]ws[facility] [employer] [place] [phone]   I have reviewed the 2 D ECHO results as well as cardiology notes. Pt has acute right side heart failure with cor pulmonale and likely obesity hypoventilation syndrome. Please let me know if this is okay or additional documentation required.  Thank you Manson PasseyAlma Arben Packman

## 2015-08-14 NOTE — Progress Notes (Signed)
Patient refused bed alarm. Will continue to monitor closely. 

## 2015-08-14 NOTE — Progress Notes (Signed)
I came by to see patient and discuss new diagnosis of HF.  He is having his echocardiogram at this time.  He mentions that his father had  " a big weak heart and died from HF".  I encouraged him that we will discuss things he can do at home to assist in his treatment plan.  I will return tomorrow to do further education.

## 2015-08-14 NOTE — Progress Notes (Signed)
  Echocardiogram 2D Echocardiogram has been performed.  Cathie BeamsGREGORY, Devanshi Califf 08/14/2015, 3:41 PM

## 2015-08-14 NOTE — Progress Notes (Signed)
PROGRESS NOTE    Kirk Lucas  ZOX:[id number] DOB: September 14, 1960 DOA: 08/13/2015 PCP: No PCP Per Patient (Confirm with patient/family/NH records and if not entered, this HAS to be entered at [facility] point of entry. "No PCP" if truly none.)   Brief Narrative: (Start on day 1 of progress note - keep it brief and live) Hypoxic respiratory failure and signs of volume overload, 55 yo male with copd and tobacco abuse, direct admission due to hypoxia and edema. Workup in progress/    Assessment & Plan:   Active Problems:   CHF (congestive heart failure) (HCC)   Heart failure (HCC)   1. Cardiovascular. Symptoms and edema has improved with diuresis, but patient still symptomatic. Noted right axis ekg, will follow on echocardiogram, blood pressure 115 to 114 systolic. Follow on echocardiogram.  2, Pulmonary. ABG with significant hypoxemia on room air, will continue to monitor oxymetry and give supplemental 02 per [place] to target 02 sat above 92%, will continue diuresis and will check chest CT with contrast to rule out PE. Continue bronchodilator therapy.  3,. Nephrology. Renal function with cr at  0.97 with K at 3.8 and Na at 139, tolerating well diuresis, urine out put 1500cc since admission. Continue fluid restriction.  4, Neurology. Patient awake and alert.    DVT prophylaxis: (Lovenox/Heparin/SCD's/anticoagulated/None (if comfort care) Code Status: (Full/Partial - specify details) Family Communication: (Specify name, relationship & date discussed. NO "discussed with patient") Disposition Plan: (specify when and where you expect patient to be discharged). Include barriers to DC in this tab.   Consultants:     Procedures: (Don't include imaging studies which can be auto populated. Include things that cannot be auto populated i.e. Echo, Carotid and venous dopplers, Foley, Bipap, HD, tubes/drains, wound vac, central lines etc)    Antimicrobials: (specify start and planned stop date. Auto  populated tables are space occupying and do not give end dates)     Subjective: Patient with improved dyspnea and edema, but still present and persistent, no worsening factors, improved with diuresis. No chest pain, nausea or vomiting.   Objective: Filed Vitals:   08/14/15 0216 08/14/15 0558 08/14/15 1113 08/14/15 1125  BP:  122/74 115/74   Pulse:  97 83 77  Temp:  98 F (36.7 C)  97.8 F (36.6 C)  TempSrc:  Oral  Oral  Resp:  18  18  Height:      Weight:  151.592 kg (334 lb 3.2 oz)    SpO2: 89% 90% 90% 90%    Intake/Output Summary (Last 24 hours) at 08/14/15 1208 Last data filed at 08/14/15 0828  Gross per 24 hour  Intake    600 ml  Output   1500 ml  Net   -900 ml   Filed Weights   08/13/15 1146 08/13/15 1714 08/14/15 0558  Weight: 157.398 kg (347 lb) 154.404 kg (340 lb 6.4 oz) 151.592 kg (334 lb 3.2 oz)    Examination:  General exam: not in pain or dyspnea E ENT: oral mucosa moist, no conjunctival pallor. Respiratory system: Respiratory effort normal.Posotive expiratory wheezing and prolonged expiratory phase, no rales or rhonchi.  Cardiovascular system: S1 & S2 heard, RRR. No JVD, murmurs, rubs, gallops or clicks.  Positive lower ext edema +++ pitting bilaterally, decreased scrotal edema. Gastrointestinal system: Abdomen is nondistended, soft and nontender. No organomegaly or masses felt. Normal bowel sounds heard. Central nervous system: Alert and oriented. No focal neurological deficits. Extremities: Symmetric 5 x 5 power. Skin: No rashes, lesions or ulcers  Psychiatry: Judgement and insight appear normal. Mood & affect appropriate.     Data Reviewed: I have personally reviewed following labs and imaging studies  CBC:  Recent Labs Lab 08/13/15 1229 08/13/15 1750 08/14/15 0822  WBC 7.7 8.1 7.8  NEUTROABS 5.8 5.3  --   HGB 17.2* 17.7* 16.5  HCT 55.2* 57.1* 54.9*  MCV 96.7 96.1 97.0  PLT 166 148* 149*   Basic Metabolic Panel:  Recent Labs Lab  08/13/15 1229 08/13/15 1750 08/14/15 0822  NA 141 139 139  K 4.1 3.7 3.8  CL 100* 96* 93*  CO2 34* 34* 36*  GLUCOSE 97 86 133*  BUN 16 11 10   CREATININE 0.93 0.96 0.97  CALCIUM 8.6* 8.9 8.6*  MG  --  1.1*  --   PHOS  --  3.7  --    GFR: Estimated Creatinine Clearance: 133.5 mL/min (by C-G formula based on Cr of 0.97). Liver Function Tests:  Recent Labs Lab 08/13/15 1229 08/13/15 1750  AST 18 19  ALT 16* 17  ALKPHOS 59 62  BILITOT 0.8 1.1  PROT 6.7 6.7  ALBUMIN 3.4* 3.2*   No results for input(s): LIPASE, AMYLASE in the last 168 hours. No results for input(s): AMMONIA in the last 168 hours. Coagulation Profile:  Recent Labs Lab 08/13/15 1750  INR 1.16   Cardiac Enzymes:  Recent Labs Lab 08/13/15 1229 08/13/15 1750 08/13/15 1918 08/13/15 2337 08/13/15 2338 08/14/15 0822  CKTOTAL  --   --  73  --  69 83  CKMB  --   --  3.6  --  3.2 2.3  TROPONINI 0.07* 0.07*  --  0.07*  --  0.07*   BNP (last 3 results) No results for input(s): PROBNP in the last 8760 hours. HbA1C: No results for input(s): HGBA1C in the last 72 hours. CBG: No results for input(s): GLUCAP in the last 168 hours. Lipid Profile: No results for input(s): CHOL, HDL, LDLCALC, TRIG, CHOLHDL, LDLDIRECT in the last 72 hours. Thyroid Function Tests:  Recent Labs  08/13/15 1751  TSH 4.308   Anemia Panel: No results for input(s): VITAMINB12, FOLATE, FERRITIN, TIBC, IRON, RETICCTPCT in the last 72 hours. Sepsis Labs: No results for input(s): PROCALCITON, LATICACIDVEN in the last 168 hours.  No results found for this or any previous visit (from the past 240 hour(s)).       Radiology Studies: X-ray Chest Pa And Lateral  08/13/2015  CLINICAL DATA:  Abnormal EKG today.  Lower extremity swelling. EXAM: CHEST  2 VIEW COMPARISON:  PA and lateral chest 08/13/2015. FINDINGS: Lung volumes are somewhat low. No focal airspace disease identified. Peribronchial thickening is noted. No pneumothorax or  pleural effusion. Heart size is normal. IMPRESSION: Bronchitic change without focal process. Electronically Signed   By: Drusilla Kannerhomas  Dalessio M.D.   On: 08/13/2015 19:21   Dg Chest 2 View  08/13/2015  CLINICAL DATA:  Cough and shortness of breath for 2 weeks EXAM: CHEST  2 VIEW COMPARISON:  None. FINDINGS: There is no edema or consolidation. Heart is upper normal in size with pulmonary vascularity within normal limits. No adenopathy. No pneumothorax. No bone lesions. IMPRESSION: No edema or consolidation. Electronically Signed   By: Bretta BangWilliam  Woodruff III M.D.   On: 08/13/2015 12:51        Scheduled Meds: . aspirin EC  81 mg Oral Daily  . furosemide  40 mg Intravenous Q12H  . heparin  5,000 Units Subcutaneous Q8H  . ipratropium-albuterol  3 mL Nebulization Q6H  .  sodium chloride flush  3 mL Intravenous Q12H  . sodium chloride flush  3 mL Intravenous Q12H   Continuous Infusions:    LOS: 1 day      Mauricio Annett Gula, MD [facility] Pager [phone]  If 7PM-7AM, please contact night-coverage [email or web address] Password [facility] 08/14/2015, 12:08 PM

## 2015-08-15 ENCOUNTER — Encounter (HOSPITAL_COMMUNITY): Payer: Self-pay | Admitting: Cardiology

## 2015-08-15 DIAGNOSIS — I272 Other secondary pulmonary hypertension: Secondary | ICD-10-CM

## 2015-08-15 DIAGNOSIS — G4730 Sleep apnea, unspecified: Secondary | ICD-10-CM | POA: Diagnosis present

## 2015-08-15 DIAGNOSIS — I4729 Other ventricular tachycardia: Secondary | ICD-10-CM

## 2015-08-15 DIAGNOSIS — J9601 Acute respiratory failure with hypoxia: Secondary | ICD-10-CM

## 2015-08-15 DIAGNOSIS — Z8249 Family history of ischemic heart disease and other diseases of the circulatory system: Secondary | ICD-10-CM

## 2015-08-15 DIAGNOSIS — I509 Heart failure, unspecified: Secondary | ICD-10-CM

## 2015-08-15 DIAGNOSIS — G4733 Obstructive sleep apnea (adult) (pediatric): Secondary | ICD-10-CM

## 2015-08-15 DIAGNOSIS — F17210 Nicotine dependence, cigarettes, uncomplicated: Secondary | ICD-10-CM | POA: Diagnosis present

## 2015-08-15 DIAGNOSIS — I2781 Cor pulmonale (chronic): Secondary | ICD-10-CM | POA: Diagnosis present

## 2015-08-15 DIAGNOSIS — E662 Morbid (severe) obesity with alveolar hypoventilation: Secondary | ICD-10-CM

## 2015-08-15 DIAGNOSIS — I472 Ventricular tachycardia: Secondary | ICD-10-CM

## 2015-08-15 LAB — BASIC METABOLIC PANEL
ANION GAP: 6 (ref 5–15)
BUN: 7 mg/dL (ref 6–20)
CHLORIDE: 90 mmol/L — AB (ref 101–111)
CO2: 44 mmol/L — AB (ref 22–32)
CREATININE: 1.12 mg/dL (ref 0.61–1.24)
Calcium: 8.4 mg/dL — ABNORMAL LOW (ref 8.9–10.3)
GFR calc non Af Amer: >60 mL/min (ref >60)
GLUCOSE: 104 mg/dL — AB (ref 65–99)
Potassium: 4.4 mmol/L (ref 3.5–5.1)
Sodium: 140 mmol/L (ref 135–145)

## 2015-08-15 LAB — CK TOTAL AND CKMB (NOT AT ARMC)
CK TOTAL: 107 U/L (ref 49–397)
CK TOTAL: 67 U/L (ref 49–397)
CK, MB: 1.4 ng/mL (ref 0.5–5.0)
CK, MB: 1.4 ng/mL (ref 0.5–5.0)
CK, MB: 1.6 ng/mL (ref 0.5–5.0)
CK, MB: 1.7 ng/mL (ref 0.5–5.0)
RELATIVE INDEX: INVALID (ref 0.0–2.5)
RELATIVE INDEX: INVALID (ref 0.0–2.5)
Relative Index: 1.5 (ref 0.0–2.5)
Relative Index: INVALID (ref 0.0–2.5)
Total CK: 87 U/L (ref 49–397)
Total CK: 65 U/L (ref 49–397)

## 2015-08-15 LAB — MAGNESIUM: Magnesium: 1.2 mg/dL — ABNORMAL LOW (ref 1.7–2.4)

## 2015-08-15 MED ORDER — MAGNESIUM SULFATE 2 GM/50ML IV SOLN
2.0000 g | Freq: Once | INTRAVENOUS | Status: AC
  Administered 2015-08-15: 2 g via INTRAVENOUS
  Filled 2015-08-15: qty 50

## 2015-08-15 MED ORDER — FUROSEMIDE 10 MG/ML IJ SOLN
40.0000 mg | Freq: Every day | INTRAMUSCULAR | Status: DC
  Administered 2015-08-16 – 2015-08-19 (×4): 40 mg via INTRAVENOUS
  Filled 2015-08-15 (×4): qty 4

## 2015-08-15 MED ORDER — MAGNESIUM OXIDE 400 (241.3 MG) MG PO TABS
400.0000 mg | ORAL_TABLET | Freq: Two times a day (BID) | ORAL | Status: DC
  Administered 2015-08-15: 400 mg via ORAL
  Filled 2015-08-15: qty 1

## 2015-08-15 MED ORDER — IPRATROPIUM-ALBUTEROL 0.5-2.5 (3) MG/3ML IN SOLN
3.0000 mL | Freq: Three times a day (TID) | RESPIRATORY_TRACT | Status: DC
  Administered 2015-08-15 – 2015-08-18 (×8): 3 mL via RESPIRATORY_TRACT
  Filled 2015-08-15 (×8): qty 3

## 2015-08-15 MED ORDER — METOPROLOL TARTRATE 12.5 MG HALF TABLET
12.5000 mg | ORAL_TABLET | Freq: Two times a day (BID) | ORAL | Status: DC
  Administered 2015-08-15 – 2015-08-19 (×8): 12.5 mg via ORAL
  Filled 2015-08-15 (×8): qty 1

## 2015-08-15 NOTE — Progress Notes (Signed)
PROGRESS NOTE    Kirk Lucas  ZOX:[id number] DOB: 1960/12/27 DOA: 08/13/2015 PCP: No PCP Per Patient (Confirm with patient/family/NH records and if not entered, this HAS to be entered at [facility] point of entry. "No PCP" if truly none.)   Brief Narrative: (Start on day 1 of progress note - keep it brief and live) Hypoxic respiratory failure and signs of volume overload, 55 yo male with copd and tobacco abuse, direct admission due to hypoxia and edema. Found to have right heart failure and episodes of non sustained ventricular tachycardia. Negative for PE.   Assessment & Plan:   Principal Problem:   Respiratory failure with hypoxia (HCC) Active Problems:   CHF- etiology not yet determined   Morbid obesity -BMI 46   Smoker   Sleep apnea by history   Pulmonary hypertension-PA 52 mmHg by echo   NSVT (nonsustained ventricular tachycardia) (HCC)   Family history of coronary artery disease  1. Cardiovascular. Patient is about 2 L negative fluid balance. Workup positive for predominant right heart failure, possible pulmonary hypertension. Negative for pulmonary embolism per angiography. Personally reviewed telemetry noted nonsustained ventricular tachycardia. Patient may need right heart cath to confirm pulmonary htn, will consult cardiology for further recommendations. Will correct electrolytes, to keep k at 4 and mag at 2. Due to right heart failure will hold on further diuresis due to pre-load dependence and risk for hemodynamic decompensation.   2, Pulmonary.  Severe hypoxemia on room air, improved with 02 supplementation, possible underlying pulmonary htn, will continue bronchodilator therapy with duoneb, oxymetry monitor and supplemental 02 per [place]. Patient will need sleep study as outpatient and may need home oxygen.   3,. Nephrology. Renal function with cr at 1,12 with K at 4.4 and Na at 140. Negative fluid balance about 2000 ml, will hold on further diuresis due to risk of hypotension due  to right heart failure and suspected pulmonary htn.   4, Neurology. Patient awake and alert.   5. Patient is at moderate risk for worsening hypoxemic respiratory failure.   DVT prophylaxis: (Lovenox/Heparin/SCD's/anticoagulated/None (if comfort care) Code Status: (Full/Partial - specify details) Family Communication: (Specify name, relationship & date discussed. NO "discussed with patient") Disposition Plan: (specify when and where you expect patient to be discharged). Include barriers to DC in this tab.   Consultants:   cardiology  Procedures: (Don't include imaging studies which can be auto populated. Include things that cannot be auto populated i.e. Echo, Carotid and venous dopplers, Foley, Bipap, HD, tubes/drains, wound vac, central lines etc)    Antimicrobials: (specify start and planned stop date. Auto populated tables are space occupying and do not give end dates)      Subjective: Patient with improved edema and dyspnea, no chest pain, no nausea or vomiting, no diarrhea, tolerating po well, out of bed to chair.    Objective: Filed Vitals:   08/15/15 0739 08/15/15 0801 08/15/15 1150 08/15/15 1402  BP:  120/80 105/70   Pulse:  88 79   Temp:  98.2 F (36.8 C) 98 F (36.7 C)   TempSrc:  Oral Oral   Resp:  20 18   Height:      Weight:      SpO2: 80% 92% 90% 90%    Intake/Output Summary (Last 24 hours) at 08/15/15 1624 Last data filed at 08/15/15 1405  Gross per 24 hour  Intake    720 ml  Output   2650 ml  Net  -1930 ml   [employer]  08/13/15 1714 08/14/15 0558 08/15/15 0447  Weight: 154.404 kg (340 lb 6.4 oz) 151.592 kg (334 lb 3.2 oz) 148.825 kg (328 lb 1.6 oz)    Examination:  General exam: Appears calm and comfortable  Respiratory system: Clear to auscultation. Respiratory effort normal. Cardiovascular system: S1 & S2 heard, RRR. No JVD, murmurs, rubs, gallops or clicks. No pedal edema. Gastrointestinal system: Abdomen is nondistended, soft and  nontender. No organomegaly or masses felt. Normal bowel sounds heard. Central nervous system: Alert and oriented. No focal neurological deficits. Extremities: Symmetric 5 x 5 power. Skin: No rashes, lesions or ulcers Psychiatry: Judgement and insight appear normal. Mood & affect appropriate.     Data Reviewed: I have personally reviewed following labs and imaging studies  CBC:  Recent Labs Lab 08/13/15 1229 08/13/15 1750 08/14/15 0822  WBC 7.7 8.1 7.8  NEUTROABS 5.8 5.3  --   HGB 17.2* 17.7* 16.5  HCT 55.2* 57.1* 54.9*  MCV 96.7 96.1 97.0  PLT 166 148* 149*   Basic Metabolic Panel:  Recent Labs Lab 08/13/15 1229 08/13/15 1750 08/14/15 0822 08/15/15 0646 08/15/15 1224  NA 141 139 139 140  --   K 4.1 3.7 3.8 4.4  --   CL 100* 96* 93* 90*  --   CO2 34* 34* 36* 44*  --   GLUCOSE 97 86 133* 104*  --   BUN --   CREATININE 0.93 0.96 0.97 1.12  --   CALCIUM 8.6* 8.9 8.6* 8.4*  --   MG  --  1.1*  --   --  1.2*  PHOS  --  3.7  --   --   --    GFR: Estimated Creatinine Clearance: 114.5 mL/min (by C-G formula based on Cr of 1.12). Liver Function Tests:  Recent Labs Lab 08/13/15 1229 08/13/15 1750  AST 18 19  ALT 16* 17  ALKPHOS 59 62  BILITOT 0.8 1.1  PROT 6.7 6.7  ALBUMIN 3.4* 3.2*   No results for input(s): LIPASE, AMYLASE in the last 168 hours. No results for input(s): AMMONIA in the last 168 hours. Coagulation Profile:  Recent Labs Lab 08/13/15 1750  INR 1.16   Cardiac Enzymes:  Recent Labs Lab 08/13/15 1229 08/13/15 1750  08/13/15 2337  08/14/15 0822 08/14/15 1203 08/14/15 1758 08/15/15 0011 08/15/15 0646 08/15/15 1224  CKTOTAL  --   --   < >  --   < > 83 101 92 107 87 65  CKMB  --   --   < >  --   < > 2.3 2.0 1.7 1.6 1.7 1.4  TROPONINI 0.07* 0.07*  --  0.07*  --  0.07*  --   --   --   --   --   < > = values in this interval not displayed. BNP (last 3 results) No results for input(s): PROBNP in the last 8760 hours. HbA1C: No  results for input(s): HGBA1C in the last 72 hours. CBG: No results for input(s): GLUCAP in the last 168 hours. Lipid Profile: No results for input(s): CHOL, HDL, LDLCALC, TRIG, CHOLHDL, LDLDIRECT in the last 72 hours. Thyroid Function Tests:  Recent Labs  08/13/15 1751  TSH 4.308   Anemia Panel: No results for input(s): VITAMINB12, FOLATE, FERRITIN, TIBC, IRON, RETICCTPCT in the last 72 hours. Sepsis Labs: No results for input(s): PROCALCITON, LATICACIDVEN in the last 168 hours.  No results found for this or any previous visit (from the past 240 hour(s)).  Radiology Studies: X-ray Chest Pa And Lateral  08/13/2015  CLINICAL DATA:  Abnormal EKG today.  Lower extremity swelling. EXAM: CHEST  2 VIEW COMPARISON:  PA and lateral chest 08/13/2015. FINDINGS: Lung volumes are somewhat low. No focal airspace disease identified. Peribronchial thickening is noted. No pneumothorax or pleural effusion. Heart size is normal. IMPRESSION: Bronchitic change without focal process. Electronically Signed   By: Drusilla Kannerhomas  Dalessio M.D.   On: 08/13/2015 19:21   Ct Angio Chest Pe W/cm &/or Wo Cm  08/14/2015  CLINICAL DATA:  Intermittent shortness of breath. Lower extremity edema EXAM: CT ANGIOGRAPHY CHEST WITH CONTRAST TECHNIQUE: Multidetector CT imaging of the chest was performed using the standard protocol during bolus administration of intravenous contrast. Multiplanar CT image reconstructions and MIPs were obtained to evaluate the vascular anatomy. CONTRAST:  100 mL Isovue 370 nonionic COMPARISON:  Chest radiograph August 13, 2015 FINDINGS: Mediastinum/Lymph Nodes: There is no demonstrable pulmonary embolus. There is no thoracic aortic aneurysm or dissection. There are scattered foci of calcification in the aorta. The visualized great vessels appear unremarkable except for slight calcification at the origins of the right common and left subclavian arteries. Pericardium is not appreciably thickened. Heart is  mildly enlarged. There are scattered foci of coronary artery calcification. There is prominence of the left hemi- azygous vein of uncertain etiology. There is a small calcification in the left lobe of the thyroid. Thyroid otherwise appears unremarkable. There are multiple small mediastinal lymph nodes. There is no adenopathy by size criteria. Lungs/Pleura: There is patchy atelectasis in both posterior lung bases. On axial slice 43 series 6, there is a 5 x 4 mm nodular opacity in the posterior segment of the right upper lobe. Upper abdomen: In the visualized upper abdomen, there is incomplete visualization of apparent cholelithiasis. Visualized upper abdominal structures otherwise appear unremarkable. Musculoskeletal: There is degenerative change in the thoracic spine. There are no blastic or lytic bone lesions. Review of the MIP images confirms the above findings. IMPRESSION: No demonstrable pulmonary embolus. There is patchy bibasilar atelectasis. There is a 5 mm nodular opacity in the posterior segment right upper lobe. No follow-up needed if patient is low-risk. Non-contrast chest CT can be considered in 12 months if patient is high-risk. This recommendation follows the consensus statement: Guidelines for Management of Incidental Pulmonary Nodules Detected on CT Images:From the Fleischner Society 2017; published online before print (10.1148/[id number]). No demonstrable adenopathy. Incomplete visualization of apparent cholelithiasis. Scattered foci of coronary artery calcification. Prominence of the left hemiazygous vein, a finding of uncertain etiology or clinical significance. Electronically Signed   By: Bretta BangWilliam  Woodruff III M.D.   On: 08/14/2015 14:35        Scheduled Meds: . aspirin EC  81 mg Oral Daily  . [START ON 08/16/2015] furosemide  40 mg Intravenous Daily  . heparin  5,000 Units Subcutaneous Q8H  . ipratropium-albuterol  3 mL Nebulization TID  . magnesium oxide  400 mg Oral BID  .  metoprolol tartrate  12.5 mg Oral BID  . sodium chloride flush  3 mL Intravenous Q12H  . sodium chloride flush  3 mL Intravenous Q12H   Continuous Infusions:    LOS: 2 days        Mauricio Annett Gulaaniel Arrien, MD [facility] Pager [phone]  If 7PM-7AM, please contact night-coverage [email or web address] Password [email or web address] 08/15/2015, 4:24 PM

## 2015-08-15 NOTE — Progress Notes (Signed)
Pt had 15 beat run of VT. Pt asymptomatic. VSS. Paged MD. Will continue to monitor.

## 2015-08-15 NOTE — Consult Note (Addendum)
Reason for Consult:   CHF, NSVT  Requesting Physician: [facility] Primary Cardiologist New  HPI:   55 y/o obese Caucasian male, no PCP, single, 1 ppd smoker,currently unemployed (works as a Counsellor) admitted as a transfer from [facility] 08/13/15 after he presented there with complaints of increased wgt and edema for the previous two weeks. The pt denies any prior history of HTN, DM, CHF, or CAD. He denies any history of chest pain.  He was on no medications prior to admission. He admits he had been using salt freely "my favorite spice" up until 4 weeks ago when he cut back on using it. On admission he had a BNP of 340. CXR did not show CHF. Chest CT did not show CHF or PE. Echo on 08/14/15 showed an EF of 60-65% (no mention of diastolic dysfunction) with a PA pressure of 52 mmHg. Since admission he has diuresed 22 lbs. He still has 2+ LE edema.  Since admission he has had several runs on NSWCT (looks like VT) up to 22 bts. He is unaware of these episodes and has no history of syncope. He does think he has sleep apnea, "I wake up after dreaming I can't breathe", and he knows he snores.   PMHx:  Past Medical History  Diagnosis Date  . CHF (congestive heart failure) (HCC) June 2017    normal LVF by echo  . Morbid obesity (HCC)     BMI 46  . Sleep apnea     by history  . Smoker     Past Surgical History  Procedure Laterality Date  . Arm surgery    . Wrist surgery      SOCHx:  reports that he has been smoking Cigarettes.  He has been smoking about 1.00 pack per day. He does not have any smokeless tobacco history on file. He reports that he does not drink alcohol or use illicit drugs.  FAMHx: Family History  Problem Relation Age of Onset  . Coronary artery disease Father 33    MI  . Coronary artery disease Mother 2    MI  . Congestive Heart Failure Father 46    died CHF commplications  . Stroke Mother 49    died after CVA  . Diabetes type II Sister      twin sister with DM    ALLERGIES: Allergies  Allergen Reactions  . Codeine Nausea Only  . Duricef [Cefadroxil] Itching    ROS: Review of Systems: General: negative for chills, fever, night sweats or weight changes.  Cardiovascular: negative for chest pain, dyspnea on exertion, orthopnea, palpitations, paroxysmal nocturnal dyspnea or shortness of breath HEENT: negative for any visual disturbances, blindness, glaucoma Dermatological: negative for rash Respiratory: negative for cough, hemoptysis, or wheezing Urologic: negative for hematuria or dysuria Abdominal: negative for nausea, vomiting, diarrhea, bright red blood per rectum, melena, or hematemesis Neurologic: negative for visual changes, syncope, or dizziness Musculoskeletal: negative for back pain, joint pain, or swelling Psych: cooperative and appropriate All other systems reviewed and are otherwise negative except as noted above.   HOME MEDICATIONS: Prior to Admission medications   Medication Sig Start Date End Date Taking? Authorizing Provider  amoxicillin (AMOXIL) 500 MG capsule Take 500 mg by mouth 3 (three) times daily. For 10 days 08/09/15  Yes Historical Provider, MD  hydrocortisone cream 1 % Apply 1 application topically as needed (for rashy areas).   Yes Historical Provider, MD  ibuprofen (  ADVIL,MOTRIN) 200 MG tablet Take 800 mg by mouth every 6 (six) hours as needed for headache or moderate pain.   Yes Historical Provider, MD  oxymetazoline (AFRIN) 0.05 % nasal spray Place 1-3 sprays into both nostrils 2 (two) times daily as needed for congestion.   Yes Historical Provider, MD    HOSPITAL MEDICATIONS: I have reviewed the patient's current medications.  VITALS: Blood pressure 120/80, pulse 88, temperature 98.2 F (36.8 C), temperature source Oral, resp. rate 20, height 6' (1.829 m), weight 328 lb 1.6 oz (148.825 kg), SpO2 92 %.  PHYSICAL EXAM: General appearance: alert, cooperative, no distress and morbidly  obese Neck: no carotid bruit and no JVD Lungs: few rhonchi, diffuse wheezes bilaterally  Heart: regular rate and rhythm Abdomen: obese Extremities: 2+ bilateral LE edema, left leg has ? Early cellulitis  Pulses: 2+ and symmetric Skin: pale cool dry Neurologic: Grossly normal  LABS: Results for orders placed or performed during the hospital encounter of 08/13/15 (from the past 24 hour(s))  CK total and CKMB (cardiac)not at [facility]     Status: None   Collection Time: 08/14/15 12:03 PM  Result Value Ref Range   Total CK 101 49 - 397 U/L   CK, MB 2.0 0.5 - 5.0 ng/mL   Relative Index 2.0 0.0 - 2.5  Blood gas, arterial     Status: Abnormal   Collection Time: 08/14/15  1:00 PM  Result Value Ref Range   FIO2 0.21    pH, Arterial 7.424 7.350 - 7.450   pCO2 arterial 63.8 (HH) 35.0 - 45.0 mmHg   pO2, Arterial 42.4 (L) 80.0 - 100.0 mmHg   Bicarbonate 41.1 (H) 20.0 - 24.0 mEq/L   TCO2 43.0 0 - 100 mmol/L   Acid-Base Excess 15.7 (H) 0.0 - 2.0 mmol/L   O2 Saturation 74.3 %   Patient temperature 98.6    Collection site LEFT RADIAL    Drawn by [id number]    Sample type ARTERIAL DRAW    Allens test (pass/fail) PASS PASS  CK total and CKMB (cardiac)not at [facility]     Status: None   Collection Time: 08/14/15  5:58 PM  Result Value Ref Range   Total CK 92 49 - 397 U/L   CK, MB 1.7 0.5 - 5.0 ng/mL   Relative Index RELATIVE INDEX IS INVALID 0.0 - 2.5  CK total and CKMB (cardiac)not at [facility]     Status: None   Collection Time: 08/15/15 12:11 AM  Result Value Ref Range   Total CK 107 49 - 397 U/L   CK, MB 1.6 0.5 - 5.0 ng/mL   Relative Index 1.5 0.0 - 2.5  Basic metabolic panel     Status: Abnormal   Collection Time: 08/15/15  6:46 AM  Result Value Ref Range   Sodium 140 135 - 145 mmol/L   Potassium 4.4 3.5 - 5.1 mmol/L   Chloride 90 (L) 101 - 111 mmol/L   CO2 44 (H) 22 - 32 mmol/L   Glucose, Bld 104 (H) 65 - 99 mg/dL   BUN 7 6 - 20 mg/dL   Creatinine, Ser 0.45 0.61 - 1.24 mg/dL   Calcium 8.4 (L)  8.9 - 10.3 mg/dL   GFR calc non Af Amer >60 >60 mL/min   GFR calc Af Amer >60 >60 mL/min   Anion gap 6 5 - 15  CK total and CKMB (cardiac)not at [facility]     Status: None   Collection Time: 08/15/15  6:46 AM  Result  Value Ref Range   Total CK 87 49 - 397 U/L   CK, MB 1.7 0.5 - 5.0 ng/mL   Relative Index RELATIVE INDEX IS INVALID 0.0 - 2.5    EKG: NSR  IMAGING: X-ray Chest Pa And Lateral  08/13/2015  CLINICAL DATA:  Abnormal EKG today.  Lower extremity swelling. EXAM: CHEST  2 VIEW COMPARISON:  PA and lateral chest 08/13/2015. FINDINGS: Lung volumes are somewhat low. No focal airspace disease identified. Peribronchial thickening is noted. No pneumothorax or pleural effusion. Heart size is normal. IMPRESSION: Bronchitic change without focal process. Electronically Signed   By: Drusilla Kannerhomas  Dalessio M.D.   On: 08/13/2015 19:21   Dg Chest 2 View  08/13/2015  CLINICAL DATA:  Cough and shortness of breath for 2 weeks EXAM: CHEST  2 VIEW COMPARISON:  None. FINDINGS: There is no edema or consolidation. Heart is upper normal in size with pulmonary vascularity within normal limits. No adenopathy. No pneumothorax. No bone lesions. IMPRESSION: No edema or consolidation. Electronically Signed   By: Bretta BangWilliam  Woodruff III M.D.   On: 08/13/2015 12:51   Ct Angio Chest Pe W/cm &/or Wo Cm  08/14/2015  CLINICAL DATA:  Intermittent shortness of breath. Lower extremity edema EXAM: CT ANGIOGRAPHY CHEST WITH CONTRAST TECHNIQUE: Multidetector CT imaging of the chest was performed using the standard protocol during bolus administration of intravenous contrast. Multiplanar CT image reconstructions and MIPs were obtained to evaluate the vascular anatomy. CONTRAST:  100 mL Isovue 370 nonionic COMPARISON:  Chest radiograph August 13, 2015 FINDINGS: Mediastinum/Lymph Nodes: There is no demonstrable pulmonary embolus. There is no thoracic aortic aneurysm or dissection. There are scattered foci of calcification in the aorta. The  visualized great vessels appear unremarkable except for slight calcification at the origins of the right common and left subclavian arteries. Pericardium is not appreciably thickened. Heart is mildly enlarged. There are scattered foci of coronary artery calcification. There is prominence of the left hemi- azygous vein of uncertain etiology. There is a small calcification in the left lobe of the thyroid. Thyroid otherwise appears unremarkable. There are multiple small mediastinal lymph nodes. There is no adenopathy by size criteria. Lungs/Pleura: There is patchy atelectasis in both posterior lung bases. On axial slice 43 series 6, there is a 5 x 4 mm nodular opacity in the posterior segment of the right upper lobe. Upper abdomen: In the visualized upper abdomen, there is incomplete visualization of apparent cholelithiasis. Visualized upper abdominal structures otherwise appear unremarkable. Musculoskeletal: There is degenerative change in the thoracic spine. There are no blastic or lytic bone lesions. Review of the MIP images confirms the above findings. IMPRESSION: No demonstrable pulmonary embolus. There is patchy bibasilar atelectasis. There is a 5 mm nodular opacity in the posterior segment right upper lobe. No follow-up needed if patient is low-risk. Non-contrast chest CT can be considered in 12 months if patient is high-risk. This recommendation follows the consensus statement: Guidelines for Management of Incidental Pulmonary Nodules Detected on CT Images:From the Fleischner Society 2017; published online before print (10.1148/radiol.[id number]). No demonstrable adenopathy. Incomplete visualization of apparent cholelithiasis. Scattered foci of coronary artery calcification. Prominence of the left hemiazygous vein, a finding of uncertain etiology or clinical significance. Electronically Signed   By: Bretta BangWilliam  Woodruff III M.D.   On: 08/14/2015 14:35    IMPRESSION: Principal Problem:   Respiratory failure  with hypoxia (HCC) Active Problems:   CHF- etiology not yet determined   Sleep apnea by history   Pulmonary hypertension-PA 52 mmHg by echo  NSVT (nonsustained ventricular tachycardia) (HCC)   Morbid obesity -BMI 46   Smoker   Family history of coronary artery disease   RECOMMENDATION: He presumably has diastolic Lt heart failure as well as Rt heart failure secondary to obesity and sleep apnea. NSVT runs are concerning. He has a strong FMHx of CAD and he probably should have an ischemic work up despite the absence of chest pain symptoms. He could have intermittent ischemic LVD as well. Will review with MD. He has lost 22lbs but still appears to have a ways to go on exam. He'll need an OP sleep study after discharge. His only current medication is ASA 81 mg, lasix has been discontinued. I added low dose beta blocker and will order Mg++ replacement- Mg++ level 1.1 on 6/13.   Time Spent Directly with Patient: 45 minutes  Corine Shelter, [place]  [phone] beeper 08/15/2015, 11:41 AM    Attending Note:   The patient was seen and examined.  Agree with assessment and plan as noted above.  Changes made to the above note as needed.  Patient seen and independently examined with Corine Shelter, PA .   We discussed all aspects of the encounter. I agree with the assessment and plan as stated above.  1. CHF:    ?  RV failure vs. Diastolic dysfunction. Diastolic parameters look normal on echo.   RV is dilated.  Associated with moderate pulmonary HTN - PA pressure estimated at  52    Mr. Huckaba is a morbidly obese male,    Eats a very salty diet. 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   Trouble moving furniture around . DOE with exertion, profound legs swelling , scrotal swelling  Does not see a doctor regularly . Went to [facility] -   Has diuresed  Quite a bit   Seems to be feeling better.  Wt was 347 in [facility]  - weight is 20  lbs lighter .  Continue current diruesis  - will restart Lasix  Has had some leg cramping - may have been diuresing him too vigorously .  Potassium level is good.   Mag is low.  2. Obesity hypoventilation;    Needs to lose weight. Long discussion about diet    3. Obstructive sleep apnea. Will need a sleep study . Will need to follow up with pulmonary   4.  Possible CAD  - denies any angina .   CT angio done showed some coronary artery calcifications.   Smokes  Will  5. ? COPD :   Is wheezing currently .      Further plans per Int. Med      I have spent a total of 60 minutes with patient reviewing hospital  notes , telemetry, EKGs, labs and examining patient as well as establishing an assessment and plan that was discussed with the patient. > 50% of time was spent in direct patient care.    Vesta Mixer, Montez Hageman., MD, [facility] 08/15/2015, 3:12 PM [address]  [address] Office [phone] Pager [phone]

## 2015-08-15 NOTE — Progress Notes (Addendum)
Heart Failure Navigator Consult Note  Presentation: Kirk ReedyRobert Win is a 55 y.o. male who comes to the hospital sent as a direct admission from his primary care physician office. For last 2 weeks approximately patient has been developing shortness of breath, it has been progressive to the point where he is dyspneic with minimal efforts, there is no improvement factors, has been associated with decreased level of energy, cough, wheezing, difficulty sleeping, lower extremity edema and for last 24 hours severe scrotal edema. His symptoms had mild improvement with the use of nebulizer treatments. Denies any chest pain or angina symptoms, until recently patient was able to do his routine activities without dyspnea but going beyond his routine activities did provoke him dyspnea .   History reviewed. No pertinent past medical history.  Social History   Social History  . Marital Status: Single    Spouse Name: N/A  . Number of Children: N/A  . Years of Education: N/A   Social History Main Topics  . Smoking status: Current Every Day Smoker  . Smokeless tobacco: None  . Alcohol Use: No  . Drug Use: No  . Sexual Activity: Not Asked   Other Topics Concern  . None   Social History Narrative  . None    ECHO:Study Conclusions--08/14/15  - Left ventricle: The cavity size was normal. There was mild  concentric hypertrophy. Systolic function was normal. The  estimated ejection fraction was in the range of 55% to 60%. Wall  motion was normal; there were no regional wall motion  abnormalities. - Ventricular septum: The contour showed diastolic flattening. - Mitral valve: Calcified annulus. - Left atrium: The atrium was mildly dilated. - Right ventricle: The cavity size was moderately dilated. Wall  thickness was normal. Systolic function was moderately to  severely reduced. - Right atrium: The atrium was severely dilated. - Pulmonary arteries: Systolic pressure was moderately increased.   PA peak pressure: 52 mm Hg (S).  Transthoracic echocardiography. M-mode, complete 2D, spectral Doppler, and color Doppler. Birthdate: Patient birthdate: August 13, 1960. Age: Patient is 55 yr old. Sex: Gender: male. BMI: 45.3 kg/m^2. Blood pressure: 128/68 Patient status: Inpatient. Study date: Study date: 08/14/2015. Study time: 02:38 PM. Location: Bedside.   BNP    Component Value Date/Time   BNP 224.6* 08/13/2015 1752    ProBNP No results found for: PROBNP   Education Assessment and Provision:  Detailed education and instructions provided on heart failure disease management including the following:  Signs and symptoms of Heart Failure When to call the physician Importance of daily weights Low sodium diet Fluid restriction Medication management Anticipated future follow-up appointments  Patient education given on each of the above topics.  Patient acknowledges understanding and acceptance of all instructions.  I spoke with patient regarding his new diagnosis of HF.  He tells me that he lives with his sister and is currently unemployed.  He does not have insurance and is concerned about getting medications at discharge.  He does not have scale and I will give one for home use.  I explained the importance of daily weights and how they relate to the signs and symptoms of HF.  He admits that he likes salt added to his foods and says that he "has gotten better" about not adding salt to everything.  I discouraged any added salt.  I reviewed a low sodium diet and high sodium foods to avoid.  He has no insurance and denies resources to get medications at discharge.  He would benefit from  referral to the [facility] for medication assistance as well as ongoing SW assistance for financial concerns.  Education Materials:  "Living Better With Heart Failure" Booklet, Daily Weight Tracker Tool   High Risk Criteria for Readmission and/or Poor Patient Outcomes:  (Recommend  Follow-up with [facility])--yes    EF <30%- No 55-60% and PA pressure --? For cath  2 or more admissions in 6 months- No  Difficult social situation- yes --no insurance noted  Demonstrates medication noncompliance- No denies--was not taking any medications prior to admission    Barriers of Care:  New HF , Knowledge and compliance  Discharge Planning:  Plans to return to [place] with sister.  He would benefit from [facility] for ongoing HF education, symptom recognition and compliance reinforcement.

## 2015-08-16 DIAGNOSIS — J9612 Chronic respiratory failure with hypercapnia: Secondary | ICD-10-CM

## 2015-08-16 DIAGNOSIS — J9611 Chronic respiratory failure with hypoxia: Secondary | ICD-10-CM | POA: Insufficient documentation

## 2015-08-16 DIAGNOSIS — J9621 Acute and chronic respiratory failure with hypoxia: Secondary | ICD-10-CM

## 2015-08-16 DIAGNOSIS — R0902 Hypoxemia: Secondary | ICD-10-CM

## 2015-08-16 LAB — CK TOTAL AND CKMB (NOT AT ARMC)
CK TOTAL: 37 U/L — AB (ref 49–397)
CK TOTAL: 46 U/L — AB (ref 49–397)
CK, MB: 1.2 ng/mL (ref 0.5–5.0)
CK, MB: 1.2 ng/mL (ref 0.5–5.0)
CK, MB: 1.2 ng/mL (ref 0.5–5.0)
CK, MB: 1.3 ng/mL (ref 0.5–5.0)
Relative Index: INVALID (ref 0.0–2.5)
Relative Index: INVALID (ref 0.0–2.5)
Relative Index: INVALID (ref 0.0–2.5)
Relative Index: INVALID (ref 0.0–2.5)
Total CK: 63 U/L (ref 49–397)
Total CK: 55 U/L (ref 49–397)

## 2015-08-16 LAB — BASIC METABOLIC PANEL
Anion gap: 9 (ref 5–15)
BUN: 9 mg/dL (ref 6–20)
CALCIUM: 8.7 mg/dL — AB (ref 8.9–10.3)
CO2: 35 mmol/L — AB (ref 22–32)
CREATININE: 0.96 mg/dL (ref 0.61–1.24)
Chloride: 94 mmol/L — ABNORMAL LOW (ref 101–111)
GFR calc non Af Amer: >60 mL/min (ref >60)
Glucose, Bld: 103 mg/dL — ABNORMAL HIGH (ref 65–99)
Potassium: 4.1 mmol/L (ref 3.5–5.1)
SODIUM: 138 mmol/L (ref 135–145)

## 2015-08-16 LAB — MAGNESIUM: Magnesium: 1.4 mg/dL — ABNORMAL LOW (ref 1.7–2.4)

## 2015-08-16 MED ORDER — ENOXAPARIN SODIUM 40 MG/0.4ML ~~LOC~~ SOLN
40.0000 mg | SUBCUTANEOUS | Status: DC
  Administered 2015-08-16 – 2015-08-20 (×5): 40 mg via SUBCUTANEOUS
  Filled 2015-08-16 (×5): qty 0.4

## 2015-08-16 MED ORDER — MAGNESIUM SULFATE 2 GM/50ML IV SOLN
2.0000 g | Freq: Once | INTRAVENOUS | Status: AC
  Administered 2015-08-16: 2 g via INTRAVENOUS
  Filled 2015-08-16: qty 50

## 2015-08-16 NOTE — Care Management Note (Signed)
Case Management Note  Patient Details  Name: Jacolyn ReedyRobert Medal MRN: [id number] Date of Birth: [birth year]  Subjective/Objective:    Pt admitted with resp failure with hypoxia                Action/Plan:  PTA independent from home with sister.  Pt is in agreement for CM to set up initial appt with PCP - Pt [facility].  [facility] ; [facility] appt available [facility]/20/15 2pm.  CM provided pt with appt information and provided information on AVS.    Expected Discharge Date:                  Expected Discharge Plan:  Home/Self Care  In-House Referral:     Discharge planning Services  CM Consult  Post Acute Care Choice:    Choice offered to:     DME Arranged:    DME Agency:     HH Arranged:    HH Agency:     Status of Service:  In process, will continue to follow  Medicare Important Message Given:    Date Medicare IM Given:    Medicare IM give by:    Date Additional Medicare IM Given:    Additional Medicare Important Message give by:     If discussed at Long Length of Stay Meetings, dates discussed:    Additional Comments:  Cherylann ParrClaxton, Bruchy Mikel S, RN 08/16/2015, 10:04 AM

## 2015-08-16 NOTE — Progress Notes (Signed)
PROGRESS NOTE  Subjective:   Kirk Lucas is admitted with  Progressive DOE and leg swelling  Found to have right heart failure. LV systolic function is normal and the diastolic function also appears to be normal.   likey has obesity hypoventilation . Has OSA also  CT angio rule out pulmnoary embolus  I/O is -4.4 liters so far this admission    Objective:    Vital Signs:   Temp:  [98 F (36.7 C)-98.4 F (36.9 C)] 98.4 F (36.9 C) (06/16 0547) Pulse Rate:  [72-79] 73 (06/16 0547) Resp:  [18] 18 (06/16 0547) BP: (105-118)/(70-86) 118/86 mmHg (06/16 0547) SpO2:  [90 %-92 %] 92 % (06/16 0745) FiO2 (%):  [36 %] 36 % (06/16 0745) Weight:  [325 lb 9.6 oz (147.691 kg)] 325 lb 9.6 oz (147.691 kg) (06/16 0547)  Last BM Date: 08/13/15   24-hour weight change: Weight change: -2 lb 8 oz (-1.134 kg)  Weight trends: Filed Weights   08/14/15 0558 08/15/15 0447 08/16/15 0547  Weight: 334 lb 3.2 oz (151.592 kg) 328 lb 1.6 oz (148.825 kg) 325 lb 9.6 oz (147.691 kg)    Intake/Output:  06/15 0701 - 06/16 0700 In: 940 [P.O.:940] Out: 3075 [Urine:3075] Total I/O In: 240 [P.O.:240] Out: 225 [Urine:225]   Physical Exam: BP 118/86 mmHg  Pulse 73  Temp(Src) 98.4 F (36.9 C) (Oral)  Resp 18  Ht 6' (1.829 m)  Wt 325 lb 9.6 oz (147.691 kg)  BMI 44.15 kg/m2  SpO2 92%  Wt Readings from Last 3 Encounters:  08/16/15 325 lb 9.6 oz (147.691 kg)    General: Vital signs reviewed and noted. , morbid obesity  Head: Normocephalic, atraumatic.  Eyes: conjunctivae/corneas clear.  EOM's intact.   Throat: normal  Neck:  thick   Lungs:    clear   Heart:  RR  Abdomen:  Soft, non-tender, morbidly obese  Extremities: 3+ -4+ pitting edema    Neurologic: A&O X3, CN II - XII are grossly intact.   Psych: Normal     Labs: BMET:  Recent Labs  08/13/15 1750 08/14/15 0822 08/15/15 0646 08/15/15 1224  NA 139 139 140  --   K 3.7 3.8 4.4  --   CL 96* 93* 90*  --   CO2 34* 36* 44*  --     GLUCOSE 86 133* 104*  --   BUN 11 10 7   --   CREATININE 0.96 0.97 1.12  --   CALCIUM 8.9 8.6* 8.4*  --   MG 1.1*  --   --  1.2*  PHOS 3.7  --   --   --     Liver function tests:  Recent Labs  08/13/15 1229 08/13/15 1750  AST 18 19  ALT 16* 17  ALKPHOS 59 62  BILITOT 0.8 1.1  PROT 6.7 6.7  ALBUMIN 3.4* 3.2*   No results for input(s): LIPASE, AMYLASE in the last 72 hours.  CBC:  Recent Labs  08/13/15 1229 08/13/15 1750 08/14/15 0822  WBC 7.7 8.1 7.8  NEUTROABS 5.8 5.3  --   HGB 17.2* 17.7* 16.5  HCT 55.2* 57.1* 54.9*  MCV 96.7 96.1 97.0  PLT 166 148* 149*    Cardiac Enzymes:  Recent Labs  08/13/15 1229 08/13/15 1750  08/13/15 2337  08/14/15 0822  08/15/15 0646 08/15/15 1224 08/15/15 1843 08/16/15 0026  CKTOTAL  --   --   < >  --   < > 83  < > 87  65 67 63  CKMB  --   --   < >  --   < > 2.3  < > 1.7 1.4 1.4 1.3  TROPONINI 0.07* 0.07*  --  0.07*  --  0.07*  --   --   --   --   --   < > = values in this interval not displayed.  Coagulation Studies:  Recent Labs  08/13/15 1750  LABPROT 15.0  INR 1.16    Other: Invalid input(s): POCBNP No results for input(s): DDIMER in the last 72 hours. No results for input(s): HGBA1C in the last 72 hours. No results for input(s): CHOL, HDL, LDLCALC, TRIG, CHOLHDL in the last 72 hours.  Recent Labs  08/13/15 1751  TSH 4.308   No results for input(s): VITAMINB12, FOLATE, FERRITIN, TIBC, IRON, RETICCTPCT in the last 72 hours.   Other results:  EKG  ( personally reviewed )  -NSR  Medications:    Infusions:    Scheduled Medications: . aspirin EC  81 mg Oral Daily  . enoxaparin (LOVENOX) injection  40 mg Subcutaneous Q24H  . furosemide  40 mg Intravenous Daily  . ipratropium-albuterol  3 mL Nebulization TID  . metoprolol tartrate  12.5 mg Oral BID  . sodium chloride flush  3 mL Intravenous Q12H  . sodium chloride flush  3 mL Intravenous Q12H    Assessment/ Plan:   Principal Problem:    Respiratory failure with hypoxia (HCC) Active Problems:   CHF- etiology not yet determined   Morbid obesity -BMI 46   Smoker   Sleep apnea by history   Pulmonary hypertension-PA 52 mmHg by echo   NSVT (nonsustained ventricular tachycardia) (HCC)   Family history of coronary artery disease   1. Right heart failure:   Continue lasix  He has significant volume overload still  Needs to elevated his legs - higher than his heart. May need several pillows to help with this   No new recs.    Disposition:  Length of Stay: 3  Vesta Mixer, Montez Hageman., MD, [facility] 08/16/2015, 10:29 AM Office [phone] Pager [phone]

## 2015-08-16 NOTE — Progress Notes (Signed)
PROGRESS NOTE    Kirk Lucas  RUE:[id number] DOB: 1960-11-08 DOA: 08/13/2015 PCP: No PCP Per Patient (Confirm with patient/family/NH records and if not entered, this HAS to be entered at [facility] point of entry. "No PCP" if truly none.)   Brief Narrative: (Start on day 1 of progress note - keep it brief and live) Hypoxic respiratory failure and signs of volume overload, 55 yo male with copd and tobacco abuse, direct admission due to hypoxia and edema. Found to have right heart failure and episodes of non sustained ventricular tachycardia. Negative for PE.   Assessment & Plan:   Principal Problem:   Respiratory failure with hypoxia (HCC) Active Problems:   CHF- etiology not yet determined   Morbid obesity -BMI 46   Smoker   Sleep apnea by history   Pulmonary hypertension-PA 52 mmHg by echo   NSVT (nonsustained ventricular tachycardia) (HCC)   Family history of coronary artery disease   1. Cardiovascular. Patient continue to have VT on telemetry, resumed furosemide per cardiology, continue to follow blood pressure.  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 Started on metoprolol. Blood pressure 118 and 99 systolic. Will follow cardiology recommendations, keep mg at 2 and k at 4. Urine out put 2850 cc.  2, Pulmonary. Will continue diuresis with caution due to risk of hypotension, will continue to monitor oxymetry and supplemental 02 per [place] to target 02 sat above 92%. Will need sleep study as outpatient.  3,. Nephrology. Will continue to follow renal function and electrolytes, K at 4,1 and cr at 0.96, will follow on renal panel in am,. Urine out put close to 3000 ml.  4, Neurology. Patient awake and alert.   5. Patient is at moderate risk for worsening hypoxemic respiratory failure  DVT prophylaxis: (Lovenox Code Status: (Full/Partial - specify details) Family Communication: (Specify name, relationship & date discussed. NO "discussed with  patient") Disposition Plan: (specify when and where you expect patient to be discharged). Include barriers to DC in this tab.   Consultants:   cardiology  Procedures: (Don't include imaging studies which can be auto populated. Include things that cannot be auto populated i.e. Echo, Carotid and venous dopplers, Foley, Bipap, HD, tubes/drains, wound vac, central lines etc)    Antimicrobials: (specify start and planned stop date. Auto populated tables are space occupying and do not give end dates)     Subjective: Patient feeling better, edema improved but not back to baseline yet. Dyspnea improved, no chest pain, no nausea or vomiting, and tolerating po well. Out of bed to chair.   Objective: Filed Vitals:   08/15/15 1402 08/15/15 2050 08/16/15 0547 08/16/15 0745  BP:  114/84 118/86   Pulse:  72 73   Temp:  98.1 F (36.7 C) 98.4 F (36.9 C)   TempSrc:  Oral Oral   Resp:  18 18   Height:      Weight:   147.691 kg (325 lb 9.6 oz)   SpO2: 90% 92% 92% 92%    Intake/Output Summary (Last 24 hours) at 08/16/15 0814 Last data filed at 08/16/15 0537  Gross per 24 hour  Intake    940 ml  Output   3075 ml  Net  -2135 ml   Filed Weights   08/14/15 0558 08/15/15 0447 08/16/15 0547  Weight: 151.592 kg (334 lb 3.2 oz) 148.825 kg (328 lb 1.6 oz) 147.691 kg (325 lb 9.6 oz)    Examination:  General exam: not in pain  or dyspnea E ENT: no conjunctival pallor or icterus, oral mucosa moist. Respiratory system: Respiratory effort normal. Decrease air movement, but no wheezing, rales or rhonchi.  Cardiovascular system: S1 & S2 heard, RRR. No JVD, murmurs, rubs, gallops or clicks. Positive edema 2 to 3 pitting lower extremities and scrotal. Gastrointestinal system: Abdomen is nondistended, soft and nontender. No organomegaly or masses felt. Normal bowel sounds heard. Central nervous system: Alert and oriented. No focal neurological deficits. Extremities: Symmetric 5 x 5 power. Skin: No  rashes, lesions or ulcers Psychiatry: Judgement and insight appear normal. Mood & affect appropriate.     Data Reviewed: I have personally reviewed following labs and imaging studies  CBC:  Recent Labs Lab 08/13/15 1229 08/13/15 1750 08/14/15 0822  WBC 7.7 8.1 7.8  NEUTROABS 5.8 5.3  --   HGB 17.2* 17.7* 16.5  HCT 55.2* 57.1* 54.9*  MCV 96.7 96.1 97.0  PLT 166 148* 149*   Basic Metabolic Panel:  Recent Labs Lab 08/13/15 1229 08/13/15 1750 08/14/15 0822 08/15/15 0646 08/15/15 1224  NA 141 139 139 140  --   K 4.1 3.7 3.8 4.4  --   CL 100* 96* 93* 90*  --   CO2 34* 34* 36* 44*  --   GLUCOSE 97 86 133* 104*  --   BUN --   CREATININE 0.93 0.96 0.97 1.12  --   CALCIUM 8.6* 8.9 8.6* 8.4*  --   MG  --  1.1*  --   --  1.2*  PHOS  --  3.7  --   --   --    GFR: Estimated Creatinine Clearance: 113.9 mL/min (by C-G formula based on Cr of 1.12). Liver Function Tests:  Recent Labs Lab 08/13/15 1229 08/13/15 1750  AST 18 19  ALT 16* 17  ALKPHOS 59 62  BILITOT 0.8 1.1  PROT 6.7 6.7  ALBUMIN 3.4* 3.2*   No results for input(s): LIPASE, AMYLASE in the last 168 hours. No results for input(s): AMMONIA in the last 168 hours. Coagulation Profile:  Recent Labs Lab 08/13/15 1750  INR 1.16   Cardiac Enzymes:  Recent Labs Lab 08/13/15 1229 08/13/15 1750  08/13/15 2337  08/14/15 0822  08/15/15 0011 08/15/15 0646 08/15/15 1224 08/15/15 1843 08/16/15 0026  CKTOTAL  --   --   < >  --   < > 83  < > 107 87 65 67 63  CKMB  --   --   < >  --   < > 2.3  < > 1.6 1.7 1.4 1.4 1.3  TROPONINI 0.07* 0.07*  --  0.07*  --  0.07*  --   --   --   --   --   --   < > = values in this interval not displayed. BNP (last 3 results) No results for input(s): PROBNP in the last 8760 hours. HbA1C: No results for input(s): HGBA1C in the last 72 hours. CBG: No results for input(s): GLUCAP in the last 168 hours. Lipid Profile: No results for input(s): CHOL, HDL, LDLCALC,  TRIG, CHOLHDL, LDLDIRECT in the last 72 hours. Thyroid Function Tests:  Recent Labs  08/13/15 1751  TSH 4.308   Anemia Panel: No results for input(s): VITAMINB12, FOLATE, FERRITIN, TIBC, IRON, RETICCTPCT in the last 72 hours. Sepsis Labs: No results for input(s): PROCALCITON, LATICACIDVEN in the last 168 hours.  No results found for this or any previous visit (from the past 240 hour(s)).  Radiology Studies: Ct Angio Chest Pe W/cm &/or Wo Cm  08/14/2015  CLINICAL DATA:  Intermittent shortness of breath. Lower extremity edema EXAM: CT ANGIOGRAPHY CHEST WITH CONTRAST TECHNIQUE: Multidetector CT imaging of the chest was performed using the standard protocol during bolus administration of intravenous contrast. Multiplanar CT image reconstructions and MIPs were obtained to evaluate the vascular anatomy. CONTRAST:  100 mL Isovue 370 nonionic COMPARISON:  Chest radiograph August 13, 2015 FINDINGS: Mediastinum/Lymph Nodes: There is no demonstrable pulmonary embolus. There is no thoracic aortic aneurysm or dissection. There are scattered foci of calcification in the aorta. The visualized great vessels appear unremarkable except for slight calcification at the origins of the right common and left subclavian arteries. Pericardium is not appreciably thickened. Heart is mildly enlarged. There are scattered foci of coronary artery calcification. There is prominence of the left hemi- azygous vein of uncertain etiology. There is a small calcification in the left lobe of the thyroid. Thyroid otherwise appears unremarkable. There are multiple small mediastinal lymph nodes. There is no adenopathy by size criteria. Lungs/Pleura: There is patchy atelectasis in both posterior lung bases. On axial slice 43 series 6, there is a 5 x 4 mm nodular opacity in the posterior segment of the right upper lobe. Upper abdomen: In the visualized upper abdomen, there is incomplete visualization of apparent cholelithiasis.  Visualized upper abdominal structures otherwise appear unremarkable. Musculoskeletal: There is degenerative change in the thoracic spine. There are no blastic or lytic bone lesions. Review of the MIP images confirms the above findings. IMPRESSION: No demonstrable pulmonary embolus. There is patchy bibasilar atelectasis. There is a 5 mm nodular opacity in the posterior segment right upper lobe. No follow-up needed if patient is low-risk. Non-contrast chest CT can be considered in 12 months if patient is high-risk. This recommendation follows the consensus statement: Guidelines for Management of Incidental Pulmonary Nodules Detected on CT Images:From the Fleischner Society 2017; published online before print (10.1148/radiol.[id number]). No demonstrable adenopathy. Incomplete visualization of apparent cholelithiasis. Scattered foci of coronary artery calcification. Prominence of the left hemiazygous vein, a finding of uncertain etiology or clinical significance. Electronically Signed   By: Bretta BangWilliam  Woodruff III M.D.   On: 08/14/2015 14:35        Scheduled Meds: . aspirin EC  81 mg Oral Daily  . furosemide  40 mg Intravenous Daily  . heparin  5,000 Units Subcutaneous Q8H  . ipratropium-albuterol  3 mL Nebulization TID  . metoprolol tartrate  12.5 mg Oral BID  . sodium chloride flush  3 mL Intravenous Q12H  . sodium chloride flush  3 mL Intravenous Q12H   Continuous Infusions:    LOS: 3 days        Denay Pleitez Annett Gulaaniel Sheilyn Boehlke, MD [facility] Pager [phone]  If 7PM-7AM, please contact night-coverage [email or web address] Password [email or web address] 08/16/2015, 8:14 AM

## 2015-08-16 NOTE — Progress Notes (Signed)
Pt with SB in the 50s, and has decreased to 40 twice during this shift. Beta blocker held. Will continue to monitor.

## 2015-08-16 NOTE — Progress Notes (Signed)
Pt had 8 beat run VT while talking to RN.  Pt asymptomatic and MD paged.

## 2015-08-17 DIAGNOSIS — I5033 Acute on chronic diastolic (congestive) heart failure: Secondary | ICD-10-CM

## 2015-08-17 DIAGNOSIS — Z720 Tobacco use: Secondary | ICD-10-CM

## 2015-08-17 DIAGNOSIS — J9611 Chronic respiratory failure with hypoxia: Secondary | ICD-10-CM

## 2015-08-17 DIAGNOSIS — G4730 Sleep apnea, unspecified: Secondary | ICD-10-CM

## 2015-08-17 LAB — CK TOTAL AND CKMB (NOT AT ARMC)
CK TOTAL: 58 U/L (ref 49–397)
CK TOTAL: 46 U/L — AB (ref 49–397)
CK, MB: 1.1 ng/mL (ref 0.5–5.0)
CK, MB: 1.4 ng/mL (ref 0.5–5.0)
CK, MB: 1.4 ng/mL (ref 0.5–5.0)
CK, MB: 1.8 ng/mL (ref 0.5–5.0)
RELATIVE INDEX: INVALID (ref 0.0–2.5)
Relative Index: INVALID (ref 0.0–2.5)
Relative Index: INVALID (ref 0.0–2.5)
Relative Index: INVALID (ref 0.0–2.5)
Total CK: 40 U/L — ABNORMAL LOW (ref 49–397)
Total CK: 43 U/L — ABNORMAL LOW (ref 49–397)

## 2015-08-17 LAB — BASIC METABOLIC PANEL
ANION GAP: 6 (ref 5–15)
BUN: 11 mg/dL (ref 6–20)
CHLORIDE: 95 mmol/L — AB (ref 101–111)
CO2: 38 mmol/L — ABNORMAL HIGH (ref 22–32)
Calcium: 8.5 mg/dL — ABNORMAL LOW (ref 8.9–10.3)
Creatinine, Ser: 0.93 mg/dL (ref 0.61–1.24)
GFR calc non Af Amer: >60 mL/min (ref >60)
Glucose, Bld: 100 mg/dL — ABNORMAL HIGH (ref 65–99)
POTASSIUM: 4 mmol/L (ref 3.5–5.1)
SODIUM: 139 mmol/L (ref 135–145)

## 2015-08-17 LAB — MAGNESIUM: Magnesium: 1.8 mg/dL (ref 1.7–2.4)

## 2015-08-17 MED ORDER — HYDROCORTISONE 1 % EX CREA
1.0000 "application " | TOPICAL_CREAM | CUTANEOUS | Status: DC | PRN
  Administered 2015-08-17: 1 via TOPICAL
  Filled 2015-08-17: qty 28

## 2015-08-17 NOTE — Progress Notes (Addendum)
PROGRESS NOTE    Kirk Lucas  ZOX:[id number]  DOB: [birth year]  DOA: 08/13/2015 PCP: No PCP Per Patient Outpatient Specialists:   Hospital course: Hypoxic respiratory failure and signs of volume overload, 55 yo male with copd and tobacco abuse, direct admission due to hypoxia and edema. Found to have right heart failure and episodes of non sustained ventricular tachycardia. Negative for PE.  Assessment & Plan:   1. Predominant right heart failure with cor pulmonale may be obesity hypoventilation syndrome-no evidence of pulmonary emboli - Pt is diuresing well but still edematous, continue current mgmt and appeciate cardiology input.  2. Nonsustained ventricular tachycardia currently asymptomatic 3. Morbid obesity  4. Obesity hypoventilation syndrome with possible sleep apnea  DVT prophylaxis: enoxaparin Code Status: Full  Consultants:  cardiology  Procedures  Echocardiogram - see report  Subjective: Pt reports having less shortness of breath.   Objective: Filed Vitals:   08/16/15 2241 08/17/15 0500 08/17/15 0900 08/17/15 1217  BP:  127/67 99/59 108/70  Pulse: 50 84 83 77  Temp:  98.6 F (37 C) 98.3 F (36.8 C) 98.3 F (36.8 C)  TempSrc:  Oral Oral Oral  Resp:  Height:      Weight:  322 lb 9.6 oz (146.33 kg)    SpO2:  90% 91% 92%    Intake/Output Summary (Last 24 hours) at 08/17/15 1414 Last data filed at 08/17/15 1229  Gross per 24 hour  Intake    360 ml  Output   2875 ml  Net  -2515 ml   Filed Weights   08/15/15 0447 08/16/15 0547 08/17/15 0500  Weight: 328 lb 1.6 oz (148.825 kg) 325 lb 9.6 oz (147.691 kg) 322 lb 9.6 oz (146.33 kg)    Exam:  General exam: not in pain or dyspnea E ENT: no conjunctival pallor or icterus, oral mucosa moist. Respiratory system: Respiratory effort normal. Decreased air movement.  Cardiovascular system: S1 & S2 heard, RRR. No JVD, murmurs, rubs, gallops or clicks. Positive edema 2 to 3 pitting lower  extremities and scrotal. Gastrointestinal system: Abdomen is nondistended, soft and nontender. No organomegaly or masses felt. Normal bowel sounds heard. Central nervous system: Alert and oriented. No focal neurological deficits. Extremities: Symmetric 5 x 5 power. Skin: No rashes, lesions or ulcers Psychiatry: Judgement and insight appear normal. Mood & affect appropriate.   Data Reviewed: Basic Metabolic Panel:  Recent Labs Lab 08/13/15 1750 08/14/15 0822 08/15/15 0646 08/15/15 1224 08/16/15 1038 08/17/15 0545  NA 139 139 140  --  138 139  K 3.7 3.8 4.4  --  4.1 4.0  CL 96* 93* 90*  --  94* 95*  CO2 34* 36* 44*  --  35* 38*  GLUCOSE 86 133* 104*  --  103* 100*  BUN --  9 11  CREATININE 0.96 0.97 1.12  --  0.96 0.93  CALCIUM 8.9 8.6* 8.4*  --  8.7* 8.5*  MG 1.1*  --   --  1.2* 1.4* 1.8  PHOS 3.7  --   --   --   --   --    Liver Function Tests:  Recent Labs Lab 08/13/15 1229 08/13/15 1750  AST 18 19  ALT 16* 17  ALKPHOS 59 62  BILITOT 0.8 1.1  PROT 6.7 6.7  ALBUMIN 3.4* 3.2*   No results for input(s): LIPASE, AMYLASE in the last 168 hours. No results for input(s): AMMONIA in the last 168 hours. CBC:  Recent Labs  Lab 08/13/15 1229 08/13/15 1750 08/14/15 0822  WBC 7.7 8.1 7.8  NEUTROABS 5.8 5.3  --   HGB 17.2* 17.7* 16.5  HCT 55.2* 57.1* 54.9*  MCV 96.7 96.1 97.0  PLT 166 148* 149*   Cardiac Enzymes:  Recent Labs Lab 08/13/15 1229 08/13/15 1750  08/13/15 2337  08/14/15 0822  08/16/15 1233 08/16/15 1920 08/17/15 0001 08/17/15 0545 08/17/15 1149  CKTOTAL  --   --   < >  --   < > 83  < > 55 46* 40* 58 46*  CKMB  --   --   < >  --   < > 2.3  < > 1.2 1.2 1.4 1.4 1.8  TROPONINI 0.07* 0.07*  --  0.07*  --  0.07*  --   --   --   --   --   --   < > = values in this interval not displayed. BNP (last 3 results) No results for input(s): PROBNP in the last 8760 hours. CBG: No results for input(s): GLUCAP in the last 168 hours.  No results  found for this or any previous visit (from the past 240 hour(s)).   Studies: No results found.  Scheduled Meds: . aspirin EC  81 mg Oral Daily  . enoxaparin (LOVENOX) injection  40 mg Subcutaneous Q24H  . furosemide  40 mg Intravenous Daily  . ipratropium-albuterol  3 mL Nebulization TID  . metoprolol tartrate  12.5 mg Oral BID  . sodium chloride flush  3 mL Intravenous Q12H  . sodium chloride flush  3 mL Intravenous Q12H   Continuous Infusions:   Principal Problem:   Respiratory failure with hypoxia (HCC) Active Problems:   CHF- etiology not yet determined   Morbid obesity -BMI 46   Smoker   Sleep apnea by history   Pulmonary hypertension-PA 52 mmHg by echo   NSVT (nonsustained ventricular tachycardia) (HCC)   Family history of coronary artery disease   Hypoxia  Time spent:   Standley Dakinslanford Johnson, MD, FAAFP [facility] Pager [phone]  If 7PM-7AM, please contact night-coverage [email or web address] Password [email or web address] 08/17/2015, 2:14 PM    LOS: 4 days

## 2015-08-17 NOTE — Progress Notes (Signed)
Subjective:  He has diuresed significantly since he's been in the hospital and continues to diurese.  Still edematous.  No evidence of pulmonary emboli on CT angioma.  Significant right heart failure  Objective:  Vital Signs in the last 24 hours: BP 127/67 mmHg  Pulse 84  Temp(Src) 98.6 F (37 C) (Oral)  Resp 18  Ht 6' (1.829 m)  Wt 146.33 kg (322 lb 9.6 oz)  BMI 43.74 kg/m2  SpO2 90%  Physical Exam: Obese male lying in bed in no acute distress Lungs:  Bilateral expiratory wheezing Cardiac:  Regular rhythm, normal S1 and S2, no S3 Abdomen: Very large, Soft, nontender, no masses Extremities:  2+ edema present, left greater than right, some erythema and mild blistering of the left leg  Intake/Output from previous day: 06/16 0701 - 06/17 0700 In: 720 [P.O.:720] Out: 3700 [Urine:3700] Weight Filed Weights   08/15/15 0447 08/16/15 0547 08/17/15 0500  Weight: 148.825 kg (328 lb 1.6 oz) 147.691 kg (325 lb 9.6 oz) 146.33 kg (322 lb 9.6 oz)    Lab Results: Basic Metabolic Panel:  Recent Labs  09/60/4506/16/17 1038 08/17/15 0545  NA 138 139  K 4.1 4.0  CL 94* 95*  CO2 35* 38*  GLUCOSE 103* 100*  BUN 9 11  CREATININE 0.96 0.93   BNP    Component Value Date/Time   BNP 224.6* 08/13/2015 1752   Cardiac Panel (last 3 results)  Recent Labs  08/16/15 1920 08/17/15 0001 08/17/15 0545  CKTOTAL 46* 40* 58  CKMB 1.2 1.4 1.4  RELINDX RELATIVE INDEX IS INVALID RELATIVE INDEX IS INVALID RELATIVE INDEX IS INVALID   Telemetry: Sinus rhythm.  He had 17 beats of wide complex rhythm earlier today.  Likely ventricular tachycardia  Assessment/Plan:  1.  Predominant right heart failure with cor pulmonale may be obesity hypoventilation syndrome-no evidence of pulmonary emboli 2.  Nonsustained ventricular tachycardia currently asymptomatic 3.  Morbid obesity 4.  Obesity hypoventilation syndrome with possible sleep apnea  Recommendations:  Treatment of right heart failure will be a  treatment of his underlying condition including obesity, management of sleep apnea as well as treatment of his current wheezing and pulmonary condition-defer to primary team for this.  Continue diuresis.     Darden PalmerW. Spencer Verle Wheeling, Jr.  MD [facility] Cardiology  08/17/2015, 8:42 AM

## 2015-08-17 NOTE — Progress Notes (Signed)
Dr Donnie Ahoilley made aware of earlier runs nonsustained VT

## 2015-08-17 NOTE — Progress Notes (Signed)
Pt with 17 beats V tach this AM. K+ and Magnesium WNL. Pt asymptomatic. Will pass on to day shift RN and page MD.

## 2015-08-18 LAB — CK TOTAL AND CKMB (NOT AT ARMC)
CK TOTAL: 55 U/L (ref 49–397)
CK, MB: 1.1 ng/mL (ref 0.5–5.0)
CK, MB: 1.3 ng/mL (ref 0.5–5.0)
CK, MB: 1.3 ng/mL (ref 0.5–5.0)
CK, MB: 1.5 ng/mL (ref 0.5–5.0)
RELATIVE INDEX: INVALID (ref 0.0–2.5)
RELATIVE INDEX: INVALID (ref 0.0–2.5)
RELATIVE INDEX: INVALID (ref 0.0–2.5)
Relative Index: INVALID (ref 0.0–2.5)
Total CK: 40 U/L — ABNORMAL LOW (ref 49–397)
Total CK: 55 U/L (ref 49–397)
Total CK: 57 U/L (ref 49–397)

## 2015-08-18 LAB — BASIC METABOLIC PANEL
ANION GAP: 8 (ref 5–15)
BUN: 12 mg/dL (ref 6–20)
CHLORIDE: 94 mmol/L — AB (ref 101–111)
CO2: 36 mmol/L — AB (ref 22–32)
Calcium: 8.6 mg/dL — ABNORMAL LOW (ref 8.9–10.3)
Creatinine, Ser: 0.95 mg/dL (ref 0.61–1.24)
GFR calc Af Amer: >60 mL/min (ref >60)
GLUCOSE: 101 mg/dL — AB (ref 65–99)
POTASSIUM: 4.1 mmol/L (ref 3.5–5.1)
Sodium: 138 mmol/L (ref 135–145)

## 2015-08-18 LAB — CBC
HEMATOCRIT: 52 % (ref 39.0–52.0)
HEMOGLOBIN: 15.9 g/dL (ref 13.0–17.0)
MCH: 29.1 pg (ref 26.0–34.0)
MCHC: 30.6 g/dL (ref 30.0–36.0)
MCV: 95.2 fL (ref 78.0–100.0)
Platelets: 153 10*3/uL (ref 150–400)
RBC: 5.46 MIL/uL (ref 4.22–5.81)
RDW: 16.1 % — ABNORMAL HIGH (ref 11.5–15.5)
WBC: 7.1 10*3/uL (ref 4.0–10.5)

## 2015-08-18 MED ORDER — IPRATROPIUM-ALBUTEROL 0.5-2.5 (3) MG/3ML IN SOLN
3.0000 mL | RESPIRATORY_TRACT | Status: DC
  Administered 2015-08-18 (×3): 3 mL via RESPIRATORY_TRACT
  Filled 2015-08-18 (×3): qty 3

## 2015-08-18 MED ORDER — IPRATROPIUM-ALBUTEROL 0.5-2.5 (3) MG/3ML IN SOLN
3.0000 mL | Freq: Four times a day (QID) | RESPIRATORY_TRACT | Status: DC
  Administered 2015-08-19 (×2): 3 mL via RESPIRATORY_TRACT
  Filled 2015-08-18 (×2): qty 3

## 2015-08-18 NOTE — Progress Notes (Signed)
Subjective:  Sitting up in chair outside of bed and no complaints of shortness of breath.  Continues to diurese with almost a 30 pound weight loss since here.  Still above what is his dry weight which is probably less than 310 pounds.  No chest pain.  Objective:  Vital Signs in the last 24 hours: BP 119/87 mmHg  Pulse 78  Temp(Src) 98.3 F (36.8 C) (Oral)  Resp 18  Ht 6' (1.829 m)  Wt 144.97 kg (319 lb 9.6 oz)  BMI 43.34 kg/m2  SpO2 91%  Physical Exam: Obese male sitting in chair at bedside with no acute distress  Lungs: Mild wheezing which is less than yesterday Cardiac:  Regular rhythm, normal S1 and S2, no S3 Extremities:  2+ edema present, left greater than right, some erythema and mild blistering of the left leg, improved over yesterday  Intake/Output from previous day: 06/17 0701 - 06/18 0700 In: 480 [P.O.:480] Out: 2200 [Urine:2200] Weight Filed Weights   08/16/15 0547 08/17/15 0500 08/18/15 0629  Weight: 147.691 kg (325 lb 9.6 oz) 146.33 kg (322 lb 9.6 oz) 144.97 kg (319 lb 9.6 oz)    Lab Results: Basic Metabolic Panel:  Recent Labs  40/98/1106/17/17 0545 08/18/15 0523  NA 139 138  K 4.0 4.1  CL 95* 94*  CO2 38* 36*  GLUCOSE 100* 101*  BUN 11 12  CREATININE 0.93 0.95   BNP    Component Value Date/Time   BNP 224.6* 08/13/2015 1752   Cardiac Panel (last 3 results)  Recent Labs  08/17/15 1823 08/18/15 0017 08/18/15 0523  CKTOTAL 43* 40* 55  CKMB 1.1 1.1 1.3  RELINDX RELATIVE INDEX IS INVALID RELATIVE INDEX IS INVALID RELATIVE INDEX IS INVALID   Telemetry: Sinus rhythm.  Some nonsustained runs of ventricular tachycardia.    Assessment/Plan:  1.  Predominant right heart failure with cor pulmonale may be obesity hypoventilation syndrome-no evidence of pulmonary emboli 2.  Nonsustained ventricular tachycardia currently asymptomatic 3.  Morbid obesity 4.  Obesity hypoventilation syndrome with possible sleep apnea  Recommendations:  Treatment of right  heart failure will be a treatment of his underlying condition including obesity, management of sleep apnea as well as treatment of his current wheezing and pulmonary condition-defer to primary team for this.    He has lost significant weight but has a ways to go.  Continue intravenous diuresis.  Continue to watch ventricular tachycardia. Continue beta blocker for ventricular tachycardia-may titrate up if tolerates from a wheezing viewpoint.  Consider pulmonary involvement.    Darden PalmerW. Spencer Raylan Hanton, Jr.  MD [facility] Cardiology  08/18/2015, 10:22 AM

## 2015-08-18 NOTE — Progress Notes (Signed)
PROGRESS NOTE    Kirk Lucas  JWJ:[id number]  DOB: [birth year]  DOA: 08/13/2015 PCP: No PCP Per Patient Outpatient Specialists:   Hospital course: Hypoxic respiratory failure and signs of volume overload, 55 yo male with copd and tobacco abuse, direct admission due to hypoxia and edema. Found to have right heart failure and episodes of non sustained ventricular tachycardia. Negative for PE.   Assessment & Plan:   1. Predominant right heart failure with cor pulmonale may be obesity hypoventilation syndrome-no evidence of pulmonary emboli on CT - Pt is diuresing well but still very edematous, continuing IV lasix for now, He is overall down about 10L. Continue current mgmt and appeciate cardiology input.  2. Nonsustained ventricular tachycardia currently asymptomatic 3.  Dyspnea - increasing neb frequency 6/18 nebs seem to be helping although he is still very tight on exam today.  He is on 4L [place] and sats are 91%.  3. Morbid obesity  4. Obesity hypoventilation syndrome with possible sleep apnea - discussed the need for further sleep study testing on outpatient basis with patient who verbalized agreement and understanding.   DVT prophylaxis: enoxaparin Code Status: Full  Consultants:  cardiology  Procedures Echocardiogram - see below Study Conclusions - Left ventricle: The cavity size was normal. There was mild  concentric hypertrophy. Systolic function was normal. The  estimated ejection fraction was in the range of 55% to 60%. Wall motion was normal; there were no regional wall motion  abnormalities. - Ventricular septum: The contour showed diastolic flattening. - Mitral valve: Calcified annulus. - Left atrium: The atrium was mildly dilated. - Right ventricle: The cavity size was moderately dilated. Wall thickness was normal. Systolic function was moderately to  severely reduced. - Right atrium: The atrium was severely dilated. - Pulmonary arteries: Systolic pressure  was moderately increased.  PA peak pressure: 52 mm Hg (S).  Subjective: Pt reports having less shortness of breath.   Objective: Filed Vitals:   08/17/15 1217 08/17/15 2100 08/18/15 0629 08/18/15 0831  BP: 108/70 130/77 119/87   Pulse: 77 70 73 78  Temp: 98.3 F (36.8 C) 98.7 F (37.1 C) 98.3 F (36.8 C)   TempSrc: Oral Oral Oral   Resp: Height:      Weight:   319 lb 9.6 oz (144.97 kg)   SpO2: 92% 92% 93% 91%    Intake/Output Summary (Last 24 hours) at 08/18/15 1110 Last data filed at 08/18/15 0854  Gross per 24 hour  Intake    600 ml  Output   2000 ml  Net  -1400 ml   Filed Weights   08/16/15 0547 08/17/15 0500 08/18/15 0629  Weight: 325 lb 9.6 oz (147.691 kg) 322 lb 9.6 oz (146.33 kg) 319 lb 9.6 oz (144.97 kg)    Exam:  General exam: not in pain or dyspnea E ENT: no conjunctival pallor or icterus, oral mucosa moist. Respiratory system: tight lungs bilateral.   Cardiovascular system: S1 & S2 heard, RRR. No JVD, murmurs, rubs, gallops or clicks. Positive edema 2 to 3 pitting lower extremities and scrotal. Gastrointestinal system: Abdomen is nondistended, soft and nontender. No organomegaly or masses felt. Normal bowel sounds heard. Central nervous system: Alert and oriented. No focal neurological deficits. Extremities: Symmetric 5 x 5 power. Skin: No rashes, lesions or ulcers Psychiatry: Judgement and insight appear normal. Mood & affect appropriate.   Data Reviewed: Basic Metabolic Panel:  Recent Labs Lab 08/13/15 1750 08/14/15 0822 08/15/15 0646 08/15/15 1224 08/16/15 1038  08/17/15 0545 08/18/15 0523  NA 139 139 140  --  138 139 138  K 3.7 3.8 4.4  --  4.1 4.0 4.1  CL 96* 93* 90*  --  94* 95* 94*  CO2 34* 36* 44*  --  35* 38* 36*  GLUCOSE 86 133* 104*  --  103* 100* 101*  BUN 11 10 7   --  9 11 12   CREATININE 0.96 0.97 1.12  --  0.96 0.93 0.95  CALCIUM 8.9 8.6* 8.4*  --  8.7* 8.5* 8.6*  MG 1.1*  --   --  1.2* 1.4* 1.8  --   PHOS 3.7   --   --   --   --   --   --    Liver Function Tests:  Recent Labs Lab 08/13/15 1229 08/13/15 1750  AST 18 19  ALT 16* 17  ALKPHOS 59 62  BILITOT 0.8 1.1  PROT 6.7 6.7  ALBUMIN 3.4* 3.2*   No results for input(s): LIPASE, AMYLASE in the last 168 hours. No results for input(s): AMMONIA in the last 168 hours. CBC:  Recent Labs Lab 08/13/15 1229 08/13/15 1750 08/14/15 0822 08/18/15 0523  WBC 7.7 8.1 7.8 7.1  NEUTROABS 5.8 5.3  --   --   HGB 17.2* 17.7* 16.5 15.9  HCT 55.2* 57.1* 54.9* 52.0  MCV 96.7 96.1 97.0 95.2  PLT 166 148* 149* 153   Cardiac Enzymes:  Recent Labs Lab 08/13/15 1229 08/13/15 1750  08/13/15 2337  08/14/15 0822  08/17/15 0545 08/17/15 1149 08/17/15 1823 08/18/15 0017 08/18/15 0523  CKTOTAL  --   --   < >  --   < > 83  < > 58 46* 43* 40* 55  CKMB  --   --   < >  --   < > 2.3  < > 1.4 1.8 1.1 1.1 1.3  TROPONINI 0.07* 0.07*  --  0.07*  --  0.07*  --   --   --   --   --   --   < > = values in this interval not displayed. BNP (last 3 results) No results for input(s): PROBNP in the last 8760 hours. CBG: No results for input(s): GLUCAP in the last 168 hours.  No results found for this or any previous visit (from the past 240 hour(s)).   Studies: No results found.  Scheduled Meds: . aspirin EC  81 mg Oral Daily  . enoxaparin (LOVENOX) injection  40 mg Subcutaneous Q24H  . furosemide  40 mg Intravenous Daily  . ipratropium-albuterol  3 mL Nebulization TID  . metoprolol tartrate  12.5 mg Oral BID  . sodium chloride flush  3 mL Intravenous Q12H  . sodium chloride flush  3 mL Intravenous Q12H   Continuous Infusions:   Principal Problem:   Respiratory failure with hypoxia (HCC) Active Problems:   CHF- etiology not yet determined   Morbid obesity -BMI 46   Smoker   Sleep apnea by history   Pulmonary hypertension-PA 52 mmHg by echo   NSVT (nonsustained ventricular tachycardia) (HCC)   Family history of coronary artery disease    Hypoxia  Time spent:   Standley Dakinslanford Johnson, MD, FAAFP [facility] Pager [phone]  If 7PM-7AM, please contact night-coverage [email or web address] Password [email or web address] 08/18/2015, 11:10 AM    LOS: 5 days

## 2015-08-18 NOTE — Progress Notes (Signed)
Found patient on room air, sat 85%. Placed patient on [place] 4 L, sat improved to 93%

## 2015-08-19 DIAGNOSIS — I50811 Acute right heart failure: Secondary | ICD-10-CM

## 2015-08-19 DIAGNOSIS — I10 Essential (primary) hypertension: Secondary | ICD-10-CM

## 2015-08-19 LAB — CK TOTAL AND CKMB (NOT AT ARMC)
CK TOTAL: 37 U/L — AB (ref 49–397)
CK, MB: 1.3 ng/mL (ref 0.5–5.0)
CK, MB: 1.6 ng/mL (ref 0.5–5.0)
RELATIVE INDEX: INVALID (ref 0.0–2.5)
Relative Index: INVALID (ref 0.0–2.5)
Total CK: 45 U/L — ABNORMAL LOW (ref 49–397)

## 2015-08-19 MED ORDER — IPRATROPIUM-ALBUTEROL 0.5-2.5 (3) MG/3ML IN SOLN
3.0000 mL | Freq: Two times a day (BID) | RESPIRATORY_TRACT | Status: DC
  Administered 2015-08-19 – 2015-08-20 (×2): 3 mL via RESPIRATORY_TRACT
  Filled 2015-08-19 (×2): qty 3

## 2015-08-19 MED ORDER — GI COCKTAIL ~~LOC~~
30.0000 mL | Freq: Once | ORAL | Status: AC
  Administered 2015-08-19: 30 mL via ORAL
  Filled 2015-08-19: qty 30

## 2015-08-19 MED ORDER — PANTOPRAZOLE SODIUM 40 MG PO TBEC
40.0000 mg | DELAYED_RELEASE_TABLET | Freq: Every day | ORAL | Status: DC
  Administered 2015-08-19 – 2015-08-20 (×2): 40 mg via ORAL
  Filled 2015-08-19 (×2): qty 1

## 2015-08-19 MED ORDER — METOPROLOL TARTRATE 25 MG PO TABS
25.0000 mg | ORAL_TABLET | Freq: Two times a day (BID) | ORAL | Status: DC
  Administered 2015-08-19 – 2015-08-20 (×3): 25 mg via ORAL
  Filled 2015-08-19 (×3): qty 1

## 2015-08-19 NOTE — Progress Notes (Signed)
Patient ID: Kirk Lucas, male   DOB: [birth year], 55 y.o.   MRN: [id number]  PROGRESS NOTE    Kirk Lucas  WUJ:[id number] DOB: 1960-12-21 DOA: 08/13/2015  PCP: No PCP Per Patient - will set up with [facility] on discharge   Brief Narrative:  55 year old male with past medical history of COPD, hypertension, smoker who presented [facility] with increasing weight and edema for past 2 weeks prior to this admission. On admission, BNP was 340, CXR showed no edema or consolidation. CT chest showed no pulmonary embolism but there is right lung opacity which based on patient's history of smoking will need follow-up in 12 months. Since admission patient had several runs on NSWCT. Seen by cardiology in consultation.   Assessment & Plan:  Acute right sided CHF with cor pulmonale / Possible obesity hypoventilation syndrome / acute respiratory failure with hypoxia - Appreciate cariology following  - Stable respiratory status - Weight in past 72 hours: 146.3 kg --> 11.67 kg --> 143.79 kg  - Continue Lasix 40 mg IV daily - Continue daily weight and strict intake and output - Potassium and renal function are within normal limits - Use DuoNeb every 4 hours scheduled and every 4 hours as needed for shortness of breath or wheezing  Nonsustained ventricular tachycardia - Asymptomatic  Essential hypertension - Continue metoprolol 12.5 mg twice daily  Morbid obesity due to excess calories - Body mass index is 42.98 kg/(m^2). - Counseled on diet    DVT prophylaxis: Lovenox suBQ  Code Status: full code  Family Communication: no family at the bedside this am  Disposition Plan: home once cleared by cardio    Consultants:   Cardiology   Procedures:   2 D ECHO 08/14/2015 - EF 55%  Antimicrobials:   None    Subjective: Says he feels better this am.   Objective: Filed Vitals:   08/18/15 2029 08/19/15 0507 08/19/15 0838 08/19/15  0954  BP: 119/70 117/75  125/76  Pulse: 87 70  73  Temp: 98.8 F (37.1 C) 98.3 F (36.8 C)    TempSrc: Oral Oral    Resp: 18 18    Height:      Weight:  143.79 kg (317 lb)    SpO2: 93% 94% 93%     Intake/Output Summary (Last 24 hours) at 08/19/15 1113 Last data filed at 08/19/15 1023  Gross per 24 hour  Intake    560 ml  Output   3950 ml  Net  -3390 ml   Filed Weights   08/17/15 0500 08/18/15 0629 08/19/15 0507  Weight: 146.33 kg (322 lb 9.6 oz) 144.97 kg (319 lb 9.6 oz) 143.79 kg (317 lb)    Examination:  General exam: Appears calm and comfortable  Respiratory system: Clear to auscultation. Respiratory effort normal. Cardiovascular system: S1 & S2 heard, RRR.  Gastrointestinal system: Abdomen is nondistended, soft and nontender. No organomegaly or masses felt. Normal bowel sounds heard. Central nervous system: Alert and oriented. No focal neurological deficits. Extremities: LE chronic venous stasis bilaterally, RLE redness more pronounced compared with LLE. (+1-2) LE pitting edema  Skin: No rashes, lesions or ulcers Psychiatry: Judgement and insight appear normal. Mood & affect appropriate.   Data Reviewed: I have personally reviewed following labs and imaging studies  CBC:  Recent Labs Lab 08/13/15 1229 08/13/15 1750 08/14/15 0822 08/18/15 0523  WBC 7.7 8.1 7.8 7.1  NEUTROABS 5.8 5.3  --   --   HGB  17.2* 17.7* 16.5 15.9  HCT 55.2* 57.1* 54.9* 52.0  MCV 96.7 96.1 97.0 95.2  PLT 166 148* 149* 153   Basic Metabolic Panel:  Recent Labs Lab 08/13/15 1750 08/14/15 0822 08/15/15 0646 08/15/15 1224 08/16/15 1038 08/17/15 0545 08/18/15 0523  NA 139 139 140  --  138 139 138  K 3.7 3.8 4.4  --  4.1 4.0 4.1  CL 96* 93* 90*  --  94* 95* 94*  CO2 34* 36* 44*  --  35* 38* 36*  GLUCOSE 86 133* 104*  --  103* 100* 101*  BUN 11 10 7   --  9 11 12   CREATININE 0.96 0.97 1.12  --  0.96 0.93 0.95  CALCIUM 8.9 8.6* 8.4*  --  8.7* 8.5* 8.6*  MG 1.1*  --   --  1.2*  1.4* 1.8  --   PHOS 3.7  --   --   --   --   --   --    GFR: Estimated Creatinine Clearance: 132.4 mL/min (by C-G formula based on Cr of 0.95). Liver Function Tests:  Recent Labs Lab 08/13/15 1229 08/13/15 1750  AST 18 19  ALT 16* 17  ALKPHOS 59 62  BILITOT 0.8 1.1  PROT 6.7 6.7  ALBUMIN 3.4* 3.2*   No results for input(s): LIPASE, AMYLASE in the last 168 hours. No results for input(s): AMMONIA in the last 168 hours. Coagulation Profile:  Recent Labs Lab 08/13/15 1750  INR 1.16   Cardiac Enzymes:  Recent Labs Lab 08/13/15 1229 08/13/15 1750  08/13/15 2337  08/14/15 0822  08/18/15 0523 08/18/15 1103 08/18/15 1814 08/19/15 0032 08/19/15 0831  CKTOTAL  --   --   < >  --   < > 83  < > 55 57 55 37* 45*  CKMB  --   --   < >  --   < > 2.3  < > 1.3 1.5 1.3 1.3 1.6  TROPONINI 0.07* 0.07*  --  0.07*  --  0.07*  --   --   --   --   --   --   < > = values in this interval not displayed. BNP (last 3 results) No results for input(s): PROBNP in the last 8760 hours. HbA1C: No results for input(s): HGBA1C in the last 72 hours. CBG: No results for input(s): GLUCAP in the last 168 hours. Lipid Profile: No results for input(s): CHOL, HDL, LDLCALC, TRIG, CHOLHDL, LDLDIRECT in the last 72 hours. Thyroid Function Tests: No results for input(s): TSH, T4TOTAL, FREET4, T3FREE, THYROIDAB in the last 72 hours. Anemia Panel: No results for input(s): VITAMINB12, FOLATE, FERRITIN, TIBC, IRON, RETICCTPCT in the last 72 hours. Urine analysis:    Component Value Date/Time   COLORURINE YELLOW 08/13/2015 1300   APPEARANCEUR CLEAR 08/13/2015 1300   LABSPEC 1.021 08/13/2015 1300   PHURINE 6.5 08/13/2015 1300   GLUCOSEU NEGATIVE 08/13/2015 1300   HGBUR NEGATIVE 08/13/2015 1300   BILIRUBINUR NEGATIVE 08/13/2015 1300   KETONESUR NEGATIVE 08/13/2015 1300   PROTEINUR >300* 08/13/2015 1300   NITRITE NEGATIVE 08/13/2015 1300   LEUKOCYTESUR NEGATIVE 08/13/2015 1300   Sepsis  Labs: @LABRCNTIP (procalcitonin:4,lacticidven:4)   )No results found for this or any previous visit (from the past 240 hour(s)).    Radiology Studies: X-ray Chest Pa And Lateral 08/13/2015  Bronchitic change without focal process.   Dg Chest 2 View 08/13/2015 No edema or consolidation.   Ct Angio Chest Pe W/cm &/or Wo Cm 08/14/2015  No  demonstrable pulmonary embolus. There is patchy bibasilar atelectasis. There is a 5 mm nodular opacity in the posterior segment right upper lobe. No follow-up needed if patient is low-risk. Non-contrast chest CT can be considered in 12 months if patient is high-risk.    Scheduled Meds: . aspirin EC  81 mg Oral Daily  . enoxaparin (LOVENOX) injection  40 mg Subcutaneous Q24H  . furosemide  40 mg Intravenous Daily  . ipratropium-albuterol  3 mL Nebulization QID  . metoprolol tartrate  12.5 mg Oral BID  . sodium chloride flush  3 mL Intravenous Q12H  . sodium chloride flush  3 mL Intravenous Q12H   Continuous Infusions:    LOS: 6 days    Time spent: 25 minutes  Greater than 50% of the time spent on counseling and coordinating the care.   Manson Passey, MD [facility] Pager [phone]  If 7PM-7AM, please contact night-coverage [email or web address] Password [email or web address] 08/19/2015, 11:13 AM

## 2015-08-19 NOTE — Progress Notes (Signed)
Patient Name: Kirk ReedyRobert Lucas Date of Encounter: 08/19/2015   SUBJECTIVE  Breathing improved however not at baseline. No chest pain.   CURRENT MEDS . aspirin EC  81 mg Oral Daily  . enoxaparin (LOVENOX) injection  40 mg Subcutaneous Q24H  . furosemide  40 mg Intravenous Daily  . ipratropium-albuterol  3 mL Nebulization QID  . metoprolol tartrate  12.5 mg Oral BID  . sodium chloride flush  3 mL Intravenous Q12H  . sodium chloride flush  3 mL Intravenous Q12H    OBJECTIVE  Filed Vitals:   08/18/15 2029 08/19/15 0507 08/19/15 0838 08/19/15 0954  BP: 119/70 117/75  125/76  Pulse: 87 70  73  Temp: 98.8 F (37.1 C) 98.3 F (36.8 C)    TempSrc: Oral Oral    Resp: 18 18    Height:      Weight:  317 lb (143.79 kg)    SpO2: 93% 94% 93%     Intake/Output Summary (Last 24 hours) at 08/19/15 1039 Last data filed at 08/19/15 1023  Gross per 24 hour  Intake    560 ml  Output   3950 ml  Net  -3390 ml   Filed Weights   08/17/15 0500 08/18/15 0629 08/19/15 0507  Weight: 322 lb 9.6 oz (146.33 kg) 319 lb 9.6 oz (144.97 kg) 317 lb (143.79 kg)    PHYSICAL EXAM  General: Pleasant obese male in sitting in chair  NAD. Nasal cannula in place.  Neuro: Alert and oriented X 3. Moves all extremities spontaneously. Psych: Normal affect. HEENT:  Normal  Neck: Supple without bruits or JVD. Lungs:  Resp regular and unlabored, minimal diminish breath sound Heart: RRR no s3, s4, or murmurs. Abdomen: Soft, non-tender, non-distended, BS + x 4.  Extremities: No clubbing, cyanosis. 2+ BL edema. Some erythema and mild blistering of the left leg,  DP/PT/Radials 2+ and equal bilaterally.  Accessory Clinical Findings  CBC  Recent Labs  08/18/15 0523  WBC 7.1  HGB 15.9  HCT 52.0  MCV 95.2  PLT 153   Basic Metabolic Panel  Recent Labs  08/17/15 0545 08/18/15 0523  NA 139 138  K 4.0 4.1  CL 95* 94*  CO2 38* 36*  GLUCOSE 100* 101*  BUN 11 12  CREATININE 0.93 0.95  CALCIUM 8.5*  8.6*  MG 1.8  --    Liver Function Tests No results for input(s): AST, ALT, ALKPHOS, BILITOT, PROT, ALBUMIN in the last 72 hours. No results for input(s): LIPASE, AMYLASE in the last 72 hours. Cardiac Enzymes  Recent Labs  08/18/15 1103 08/18/15 1814 08/19/15 0032  CKTOTAL 57 55 37*  CKMB 1.5 1.3 1.3    TELE  Sinus rhythm. Multiple short runs of NSVT. Vent. Bigeminy.   Radiology/Studies  X-ray Chest Pa And Lateral  08/13/2015  CLINICAL DATA:  Abnormal EKG today.  Lower extremity swelling. EXAM: CHEST  2 VIEW COMPARISON:  PA and lateral chest 08/13/2015. FINDINGS: Lung volumes are somewhat low. No focal airspace disease identified. Peribronchial thickening is noted. No pneumothorax or pleural effusion. Heart size is normal. IMPRESSION: Bronchitic change without focal process. Electronically Signed   By: Drusilla Kannerhomas  Dalessio M.D.   On: 08/13/2015 19:21   Dg Chest 2 View  08/13/2015  CLINICAL DATA:  Cough and shortness of breath for 2 weeks EXAM: CHEST  2 VIEW COMPARISON:  None. FINDINGS: There is no edema or consolidation. Heart is upper normal in size with pulmonary vascularity within normal limits. No adenopathy. No pneumothorax.  No bone lesions. IMPRESSION: No edema or consolidation. Electronically Signed   By: Bretta Bang III M.D.   On: 08/13/2015 12:51   Ct Angio Chest Pe W/cm &/or Wo Cm  08/14/2015  CLINICAL DATA:  Intermittent shortness of breath. Lower extremity edema EXAM: CT ANGIOGRAPHY CHEST WITH CONTRAST TECHNIQUE: Multidetector CT imaging of the chest was performed using the standard protocol during bolus administration of intravenous contrast. Multiplanar CT image reconstructions and MIPs were obtained to evaluate the vascular anatomy. CONTRAST:  100 mL Isovue 370 nonionic COMPARISON:  Chest radiograph August 13, 2015 FINDINGS: Mediastinum/Lymph Nodes: There is no demonstrable pulmonary embolus. There is no thoracic aortic aneurysm or dissection. There are scattered foci of  calcification in the aorta. The visualized great vessels appear unremarkable except for slight calcification at the origins of the right common and left subclavian arteries. Pericardium is not appreciably thickened. Heart is mildly enlarged. There are scattered foci of coronary artery calcification. There is prominence of the left hemi- azygous vein of uncertain etiology. There is a small calcification in the left lobe of the thyroid. Thyroid otherwise appears unremarkable. There are multiple small mediastinal lymph nodes. There is no adenopathy by size criteria. Lungs/Pleura: There is patchy atelectasis in both posterior lung bases. On axial slice 43 series 6, there is a 5 x 4 mm nodular opacity in the posterior segment of the right upper lobe. Upper abdomen: In the visualized upper abdomen, there is incomplete visualization of apparent cholelithiasis. Visualized upper abdominal structures otherwise appear unremarkable. Musculoskeletal: There is degenerative change in the thoracic spine. There are no blastic or lytic bone lesions. Review of the MIP images confirms the above findings. IMPRESSION: No demonstrable pulmonary embolus. There is patchy bibasilar atelectasis. There is a 5 mm nodular opacity in the posterior segment right upper lobe. No follow-up needed if patient is low-risk. Non-contrast chest CT can be considered in 12 months if patient is high-risk. This recommendation follows the consensus statement: Guidelines for Management of Incidental Pulmonary Nodules Detected on CT Images:From the Fleischner Society 2017; published online before print (10.1148/radiol.0981191478). No demonstrable adenopathy. Incomplete visualization of apparent cholelithiasis. Scattered foci of coronary artery calcification. Prominence of the left hemiazygous vein, a finding of uncertain etiology or clinical significance. Electronically Signed   By: Bretta Bang III M.D.   On: 08/14/2015 14:35    ASSESSMENT AND PLAN  1.  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 LV systolic function is normal and the diastolic function also appears to be normal. Net diuresis of negative 12.75 L. 30lb weight loss (347-->317). Baseline less than 310lb. Continue IV diuresis.  - Treat underlying condition including obesity, management of sleep apnea .   2. NSVT - multiple short runs of NSVt, longest 8 beats. Had Vent. bigeminy yesterday. K and Mg WNL. Asymptomatic. Consider up titration of metoprolol. He is not wheezing today.   3.   Morbid obesity -BMI 46 - He will benefits from weight loss. Encouraged.   4. Possible sleep apnea - consider outpatient evaluation.    Lorelei Pont PA-C Pager [phone]  Patient seen and examined and history reviewed. Agree with above findings and plan. Feeling better. Excellent diuresis. Down 30 lbs since admission. By history he has a high likelihood of OSA. I will increase metoprolol to  25 mg bid. Would continue IV diuresis one more day. Possible DC in 1-2 days.   Jacoby Ritsema [place], MDFACC 08/19/2015 11:30 AM

## 2015-08-19 NOTE — Evaluation (Signed)
Physical Therapy Evaluation Patient Details Name: Jacolyn ReedyRobert Rule MRN: [id number] DOB: 1960/03/11 Today's Date: 08/19/2015   History of Present Illness  pt is a 55 y/o male with h/o COPD, HTN, smoking, admitted with acute R sided CHF with cor pulmonale and significant fluid overload.  Clinical Impression  Pt is at or close to baseline functioning but with sats falling if not on portable O2 and should be safe at home with O2 at this point, but hopefully can wean. There are no further acute PT needs.  Will sign off at this time.     Follow Up Recommendations No PT follow up    Equipment Recommendations  None recommended by PT    Recommendations for Other Services       Precautions / Restrictions        Mobility  Bed Mobility                  Transfers Overall transfer level: Independent                  Ambulation/Gait Ambulation/Gait assistance: Independent Ambulation Distance (Feet): 400 Feet Assistive device: None Gait Pattern/deviations: WFL(Within Functional Limits)   Gait velocity interpretation: at or above normal speed for age/gender General Gait Details: balance challenges incl stepping over objects, speed changes abrupt turns and stairs without rail use and no deviation or LOB,  Stairs Stairs: Yes Stairs assistance: Independent Stair Management: No rails;Alternating pattern;Forwards Number of Stairs: 4 General stair comments: safe and fluid  Wheelchair Mobility    Modified Rankin (Stroke Patients Only)       Balance Overall balance assessment: Independent                                           Pertinent Vitals/Pain Pain Assessment: No/denies pain    Home Living Family/patient expects to be discharged to:: Private residence Living Arrangements: Other relatives;Other (Comment) (who works) Available Help at Discharge: Family Type of Home: House Home Access: Stairs to enter     Home Layout: One level Home  Equipment: None      Prior Function Level of Independence: Independent               Hand Dominance        Extremity/Trunk Assessment   Upper Extremity Assessment: Overall WFL for tasks assessed           Lower Extremity Assessment: Overall WFL for tasks assessed      Cervical / Trunk Assessment: Normal  Communication   Communication: No difficulties  Cognition Arousal/Alertness: Awake/alert Behavior During Therapy: WFL for tasks assessed/performed Overall Cognitive Status: Within Functional Limits for tasks assessed                      General Comments General comments (skin integrity, edema, etc.): During gait on RA pt's sats dropped to 81% and 99 bpm after first 200' then on return incl stairs sats dropped to 79% at 90 bpm.  pt could get his sats to 89% with efficient breathing, but needed 2L O2 to get back into the 90's.  Pt was generally assymptomatic.    Exercises        Assessment/Plan    PT Assessment Patent does not need any further PT services  PT Diagnosis     PT Problem List    PT Treatment Interventions  PT Goals (Current goals can be found in the Care Plan section) Acute Rehab PT Goals PT Goal Formulation: All assessment and education complete, DC therapy    Frequency     Barriers to discharge        Co-evaluation               End of Session   Activity Tolerance: Patient tolerated treatment well Patient left: in chair;with call bell/phone within reach Nurse Communication: Mobility status         Time: 1610-9604 PT Time Calculation (min) (ACUTE ONLY): 17 min   Charges:   PT Evaluation $PT Eval Moderate Complexity: 1 Procedure     PT G Codes:        [name], Eliseo Gum 08/19/2015, 3:48 PM 08/19/2015  Alba Bing, PT [phone]  (pager)

## 2015-08-20 DIAGNOSIS — I472 Ventricular tachycardia: Secondary | ICD-10-CM

## 2015-08-20 LAB — BASIC METABOLIC PANEL
ANION GAP: 8 (ref 5–15)
BUN: 16 mg/dL (ref 6–20)
CHLORIDE: 95 mmol/L — AB (ref 101–111)
CO2: 34 mmol/L — ABNORMAL HIGH (ref 22–32)
CREATININE: 1.07 mg/dL (ref 0.61–1.24)
Calcium: 9.2 mg/dL (ref 8.9–10.3)
GFR calc non Af Amer: >60 mL/min (ref >60)
Glucose, Bld: 101 mg/dL — ABNORMAL HIGH (ref 65–99)
POTASSIUM: 4.3 mmol/L (ref 3.5–5.1)
SODIUM: 137 mmol/L (ref 135–145)

## 2015-08-20 LAB — MAGNESIUM: MAGNESIUM: 1.6 mg/dL — AB (ref 1.7–2.4)

## 2015-08-20 MED ORDER — PANTOPRAZOLE SODIUM 40 MG PO TBEC: 40.0000 mg | DELAYED_RELEASE_TABLET | Freq: Every day | ORAL | Status: DC

## 2015-08-20 MED ORDER — FUROSEMIDE 40 MG PO TABS: 40.0000 mg | ORAL_TABLET | Freq: Every day | ORAL | Status: DC

## 2015-08-20 MED ORDER — FUROSEMIDE 40 MG PO TABS
40.0000 mg | ORAL_TABLET | Freq: Every day | ORAL | Status: DC
  Administered 2015-08-20: 40 mg via ORAL
  Filled 2015-08-20: qty 1

## 2015-08-20 MED ORDER — METOPROLOL TARTRATE 25 MG PO TABS: 25.0000 mg | ORAL_TABLET | Freq: Two times a day (BID) | ORAL | Status: DC

## 2015-08-20 MED ORDER — ASPIRIN 81 MG PO TBEC: 81.0000 mg | DELAYED_RELEASE_TABLET | Freq: Every day | ORAL | Status: DC

## 2015-08-20 NOTE — Progress Notes (Signed)
SATURATION QUALIFICATIONS: (This note is used to comply with regulatory documentation for home oxygen)  Patient Saturations on Room Air at Rest = 92%  Patient Saturations on Room Air while Ambulating = 91%  Patient Saturations on  Liters of oxygen while Ambulating = 92%  Please briefly explain why patient needs home oxygen: No Home Oxygen Required

## 2015-08-20 NOTE — Progress Notes (Signed)
Patient discharged for home via car transport from sister.  All discharge instructions reviewed, prescriptions given and discussed, and follow-up appointments confirmed.  Patient stated understanding of all instructions.  No voiced complaints.

## 2015-08-20 NOTE — Discharge Summary (Addendum)
Physician Discharge Summary  Shondell Poulson ZOX:[id number] DOB: 02-Aug-1960 DOA: 08/13/2015  PCP: No PCP Per Patient Info provided for [facility] ([place])   Admit date: 08/13/2015 Discharge date: 08/20/2015  Recommendations for Outpatient Follow-up:  Continue metoprolol and lasix on discharge as prescribed. Follow up with cardio per scheduled appointment.  Discharge Diagnoses:  Principal Problem:   Respiratory failure with hypoxia (HCC) Active Problems:   Acute right-sided CHF (congestive heart failure) (HCC)   CHF- etiology not yet determined   Morbid obesity -BMI 46   Smoker   Sleep apnea by history   Pulmonary hypertension-PA 52 mmHg by echo   NSVT (nonsustained ventricular tachycardia) (HCC)   Family history of coronary artery disease   Hypoxia    Discharge Condition: stable   Diet recommendation: as tolerated   History of present illness:  55 year old male with past medical history of COPD, hypertension, smoker who presented [facility] with increasing weight and edema for past 2 weeks prior to this admission. On admission, BNP was 340, CXR showed no edema or consolidation. CT chest showed no pulmonary embolism but there is right lung opacity which based on patient's history of smoking will need follow-up in 12 months. Since admission patient had several runs on NSWCT. Seen by cardiology in consultation.    Hospital Course:  Assessment & Plan:  Acute right sided CHF with cor pulmonale / Possible obesity hypoventilation syndrome / acute respiratory failure with hypoxia - Stable respiratory status - Weight in past 72 hours: 146.3 kg --> 11.67 kg --> 143.79 kg --> 142.9 kg - Per cardio, lasix 40 mg daily - Continue metoprolol 25 mg BID on discharge  - Potassium and renal function are within normal limits  Nonsustained ventricular tachycardia - Asymptomatic  Essential hypertension - Continue metoprolol 25 mg twice daily  Morbid obesity due to  excess calories - Body mass index is 42.98 kg/(m^2). - Counseled on diet    DVT prophylaxis: Lovenox suBQ  Code Status: full code  Family Communication: no family at the bedside this am    Consultants:   Cardiology  Procedures:   2 D ECHO 08/14/2015 - EF 55%  Antimicrobials:   None   Signed:  Manson Passey, MD  [facility] 08/20/2015, 10:47 AM  Pager #: [phone]  Time spent in minutes: less than 30 minutes   Discharge Exam: Filed Vitals:   08/20/15 0906 08/20/15 0956  BP:  109/65  Pulse: 71 74  Temp:  97.7 F (36.5 C)  Resp: 18 18   Filed Vitals:   08/19/15 1945 08/20/15 0500 08/20/15 0906 08/20/15 0956  BP: 130/63 98/70  109/65  Pulse: 70 71 71 74  Temp: 98.2 F (36.8 C) 97.8 F (36.6 C)  97.7 F (36.5 C)  TempSrc: Oral Oral  Oral  Resp: 18 18 18 18   Height:      Weight:  142.974 kg (315 lb 3.2 oz)    SpO2: 91% 94% 95% 91%    General: Pt is alert, follows commands appropriately, not in acute distress Cardiovascular: Regular rate and rhythm, S1/S2 + Respiratory: Clear to auscultation bilaterally, no wheezing, no crackles, no rhonchi Abdominal: Soft, non tender, non distended, bowel sounds +, no guarding Extremities: LE venous skin changes, LE (+1) LE edema, no cyanosis, pulses palpable bilaterally DP and PT Neuro: Grossly nonfocal  Discharge Instructions  Discharge Instructions    Call MD for:  difficulty breathing, headache or visual disturbances    Complete by:  As directed  Call MD for:  persistant dizziness or light-headedness    Complete by:  As directed      Call MD for:  persistant nausea and vomiting    Complete by:  As directed      Call MD for:  severe uncontrolled pain    Complete by:  As directed      Diet - low sodium heart healthy    Complete by:  As directed      Discharge instructions    Complete by:  As directed   Continue metoprolol and lasix on discharge as prescribed. Follow up with cardio per scheduled  appointment.     Increase activity slowly    Complete by:  As directed             Medication List    STOP taking these medications        amoxicillin 500 MG capsule  Commonly known as:  AMOXIL      TAKE these medications        aspirin 81 MG EC tablet  Take 1 tablet (81 mg total) by mouth daily.     furosemide 40 MG tablet  Commonly known as:  LASIX  Take 1 tablet (40 mg total) by mouth daily.     hydrocortisone cream 1 %  Apply 1 application topically as needed (for rashy areas).     ibuprofen 200 MG tablet  Commonly known as:  ADVIL,MOTRIN  Take 800 mg by mouth every 6 (six) hours as needed for headache or moderate pain.     metoprolol tartrate 25 MG tablet  Commonly known as:  LOPRESSOR  Take 1 tablet (25 mg total) by mouth 2 (two) times daily.     oxymetazoline 0.05 % nasal spray  Commonly known as:  AFRIN  Place 1-3 sprays into both nostrils 2 (two) times daily as needed for congestion.     pantoprazole 40 MG tablet  Commonly known as:  PROTONIX  Take 1 tablet (40 mg total) by mouth daily.           Follow-up Information    Follow up with [facility] On 09/20/2015.   Specialty:  Internal Medicine   Why:  2pm   Contact information:   [address] [place] [address] [phone]      Follow up with Nada Boozer, NP. Go on 09/09/2015.   Specialties:  Cardiology, Radiology   Why:  @8 :00am for post hospital    Contact information:   [address] [place] [address] [phone]        The results of significant diagnostics from this hospitalization (including imaging, microbiology, ancillary and laboratory) are listed below for reference.    Significant Diagnostic Studies: X-ray Chest Pa And Lateral  08/13/2015  CLINICAL DATA:  Abnormal EKG today.  Lower extremity swelling. EXAM: CHEST  2 VIEW COMPARISON:  PA and lateral chest 08/13/2015. FINDINGS: Lung volumes are somewhat low. No focal airspace  disease identified. Peribronchial thickening is noted. No pneumothorax or pleural effusion. Heart size is normal. IMPRESSION: Bronchitic change without focal process. Electronically Signed   By: Drusilla Kanner M.D.   On: 08/13/2015 19:21   Dg Chest 2 View  08/13/2015  CLINICAL DATA:  Cough and shortness of breath for 2 weeks EXAM: CHEST  2 VIEW COMPARISON:  None. FINDINGS: There is no edema or consolidation. Heart is upper normal in size with pulmonary vascularity within normal limits. No adenopathy.  No pneumothorax. No bone lesions. IMPRESSION: No edema or consolidation. Electronically Signed   By: Bretta BangWilliam  Woodruff III M.D.   On: 08/13/2015 12:51   Ct Angio Chest Pe W/cm &/or Wo Cm  08/14/2015  CLINICAL DATA:  Intermittent shortness of breath. Lower extremity edema EXAM: CT ANGIOGRAPHY CHEST WITH CONTRAST TECHNIQUE: Multidetector CT imaging of the chest was performed using the standard protocol during bolus administration of intravenous contrast. Multiplanar CT image reconstructions and MIPs were obtained to evaluate the vascular anatomy. CONTRAST:  100 mL Isovue 370 nonionic COMPARISON:  Chest radiograph August 13, 2015 FINDINGS: Mediastinum/Lymph Nodes: There is no demonstrable pulmonary embolus. There is no thoracic aortic aneurysm or dissection. There are scattered foci of calcification in the aorta. The visualized great vessels appear unremarkable except for slight calcification at the origins of the right common and left subclavian arteries. Pericardium is not appreciably thickened. Heart is mildly enlarged. There are scattered foci of coronary artery calcification. There is prominence of the left hemi- azygous vein of uncertain etiology. There is a small calcification in the left lobe of the thyroid. Thyroid otherwise appears unremarkable. There are multiple small mediastinal lymph nodes. There is no adenopathy by size criteria. Lungs/Pleura: There is patchy atelectasis in both posterior lung bases.  On axial slice 43 series 6, there is a 5 x 4 mm nodular opacity in the posterior segment of the right upper lobe. Upper abdomen: In the visualized upper abdomen, there is incomplete visualization of apparent cholelithiasis. Visualized upper abdominal structures otherwise appear unremarkable. Musculoskeletal: There is degenerative change in the thoracic spine. There are no blastic or lytic bone lesions. Review of the MIP images confirms the above findings. IMPRESSION: No demonstrable pulmonary embolus. There is patchy bibasilar atelectasis. There is a 5 mm nodular opacity in the posterior segment right upper lobe. No follow-up needed if patient is low-risk. Non-contrast chest CT can be considered in 12 months if patient is high-risk. This recommendation follows the consensus statement: Guidelines for Management of Incidental Pulmonary Nodules Detected on CT Images:From the Fleischner Society 2017; published online before print (10.1148/[id number]). No demonstrable adenopathy. Incomplete visualization of apparent cholelithiasis. Scattered foci of coronary artery calcification. Prominence of the left hemiazygous vein, a finding of uncertain etiology or clinical significance. Electronically Signed   By: Bretta BangWilliam  Woodruff III M.D.   On: 08/14/2015 14:35    Microbiology: No results found for this or any previous visit (from the past 240 hour(s)).   Labs: Basic Metabolic Panel:  Recent Labs Lab 08/13/15 1750  08/15/15 0646 08/15/15 1224 08/16/15 1038 08/17/15 0545 08/18/15 0523 08/20/15 0340  NA 139  < > 140  --  138 139 138 137  K 3.7  < > 4.4  --  4.1 4.0 4.1 4.3  CL 96*  < > 90*  --  94* 95* 94* 95*  CO2 34*  < > 44*  --  35* 38* 36* 34*  GLUCOSE 86  < > 104*  --  103* 100* 101* 101*  BUN 11  < > 7  --  9 11 12 16   CREATININE 0.96  < > 1.12  --  0.96 0.93 0.95 1.07  CALCIUM 8.9  < > 8.4*  --  8.7* 8.5* 8.6* 9.2  MG 1.1*  --   --  1.2* 1.4* 1.8  --   --   PHOS 3.7  --   --   --   --   --    --   --   < > =  values in this interval not displayed. Liver Function Tests:  Recent Labs Lab 08/13/15 1229 08/13/15 1750  AST 18 19  ALT 16* 17  ALKPHOS 59 62  BILITOT 0.8 1.1  PROT 6.7 6.7  ALBUMIN 3.4* 3.2*   No results for input(s): LIPASE, AMYLASE in the last 168 hours. No results for input(s): AMMONIA in the last 168 hours. CBC:  Recent Labs Lab 08/13/15 1229 08/13/15 1750 08/14/15 0822 08/18/15 0523  WBC 7.7 8.1 7.8 7.1  NEUTROABS 5.8 5.3  --   --   HGB 17.2* 17.7* 16.5 15.9  HCT 55.2* 57.1* 54.9* 52.0  MCV 96.7 96.1 97.0 95.2  PLT 166 148* 149* 153   Cardiac Enzymes:  Recent Labs Lab 08/13/15 1229 08/13/15 1750  08/13/15 2337  08/14/15 0822  08/18/15 0523 08/18/15 1103 08/18/15 1814 08/19/15 0032 08/19/15 0831  CKTOTAL  --   --   < >  --   < > 83  < > 55 57 55 37* 45*  CKMB  --   --   < >  --   < > 2.3  < > 1.3 1.5 1.3 1.3 1.6  TROPONINI 0.07* 0.07*  --  0.07*  --  0.07*  --   --   --   --   --   --   < > = values in this interval not displayed. BNP: BNP (last 3 results)  Recent Labs  08/13/15 1229 08/13/15 1752  BNP 340.4* 224.6*    ProBNP (last 3 results) No results for input(s): PROBNP in the last 8760 hours.  CBG: No results for input(s): GLUCAP in the last 168 hours.

## 2015-08-20 NOTE — Discharge Instructions (Signed)
Furosemide tablets °What is this medicine? °FUROSEMIDE (fyoor [place] se mide) is a diuretic. It helps you make more urine and to lose salt and excess water from your body. This medicine is used to treat high blood pressure, and edema or swelling from heart, kidney, or liver disease. °This medicine may be used for other purposes; ask your health care provider or pharmacist if you have questions. °What should I tell my health care provider before I take this medicine? °They need to know if you have any of these conditions: °-abnormal blood electrolytes °-diarrhea or vomiting °-gout °-heart disease °-kidney disease, small amounts of urine, or difficulty passing urine °-liver disease °-thyroid disease °-an unusual or allergic reaction to furosemide, sulfa drugs, other medicines, foods, dyes, or preservatives °-pregnant or trying to get pregnant °-breast-feeding °How should I use this medicine? °Take this medicine by mouth with a glass of water. Follow the directions on the prescription label. You may take this medicine with or without food. If it upsets your stomach, take it with food or milk. Do not take your medicine more often than directed. Remember that you will need to pass more urine after taking this medicine. Do not take your medicine at a time of day that will cause you problems. Do not take at bedtime. °Talk to your pediatrician regarding the use of this medicine in children. While this drug may be prescribed for selected conditions, precautions do apply. °Overdosage: If you think you have taken too much of this medicine contact a poison control center or emergency room at once. °NOTE: This medicine is only for you. Do not share this medicine with others. °What if I miss a dose? °If you miss a dose, take it as soon as you can. If it is almost time for your next dose, take only that dose. Do not take double or extra doses. °What may interact with this medicine? °-aspirin and aspirin-like medicines °-certain  antibiotics °-chloral hydrate °-cisplatin °-cyclosporine °-digoxin °-diuretics °-laxatives °-lithium °-medicines for blood pressure °-medicines that relax muscles for surgery °-methotrexate °-NSAIDs, medicines for pain and inflammation like ibuprofen, naproxen, or indomethacin °-phenytoin °-steroid medicines like prednisone or cortisone °-sucralfate °-thyroid hormones °This list may not describe all possible interactions. Give your health care provider a list of all the medicines, herbs, non-prescription drugs, or dietary supplements you use. Also tell them if you smoke, drink alcohol, or use illegal drugs. Some items may interact with your medicine. °What should I watch for while using this medicine? °Visit your doctor or health care professional for regular checks on your progress. Check your blood pressure regularly. Ask your doctor or health care professional what your blood pressure should be, and when you should contact him or her. If you are a diabetic, check your blood sugar as directed. °You may need to be on a special diet while taking this medicine. Check with your doctor. Also, ask how many glasses of fluid you need to drink a day. You must not get dehydrated. °You may get drowsy or dizzy. Do not drive, use machinery, or do anything that needs mental alertness until you know how this drug affects you. Do not stand or sit up quickly, especially if you are an older patient. This reduces the risk of dizzy or fainting spells. Alcohol can make you more drowsy and dizzy. Avoid alcoholic drinks. °This medicine can make you more sensitive to the sun. Keep out of the sun. If you cannot avoid being in the sun, wear protective clothing and   use sunscreen. Do not use sun lamps or tanning beds/booths. What side effects may I notice from receiving this medicine? Side effects that you should report to your doctor or health care professional as soon as possible: -blood in urine or stools -dry mouth -fever or  chills -hearing loss or ringing in the ears -irregular heartbeat -muscle pain or weakness, cramps -skin rash -stomach upset, pain, or nausea -tingling or numbness in the hands or feet -unusually weak or tired -vomiting or diarrhea -yellowing of the eyes or skin Side effects that usually do not require medical attention (report to your doctor or health care professional if they continue or are bothersome): -headache -loss of appetite -unusual bleeding or bruising This list may not describe all possible side effects. Call your doctor for medical advice about side effects. You may report side effects to FDA at 1-800-FDA-1088. Where should I keep my medicine? Keep out of the reach of children. Store at room temperature between 15 and 30 degrees C (59 and 86 degrees F). Protect from light. Throw away any unused medicine after the expiration date. NOTE: This sheet is a summary. It may not cover all possible information. If you have questions about this medicine, talk to your doctor, pharmacist, or health care provider.    2016, Elsevier/Gold Standard. (2014-05-09 13:49:50) Metoprolol extended-release tablets What is this medicine? METOPROLOL (me TOE proe lole) is a beta-blocker. Beta-blockers reduce the workload on the heart and help it to beat more regularly. This medicine is used to treat high blood pressure and to prevent chest pain. It is also used to after a heart attack and to prevent an additional heart attack from occurring. This medicine may be used for other purposes; ask your health care provider or pharmacist if you have questions. What should I tell my health care provider before I take this medicine? They need to know if you have any of these conditions: -diabetes -heart or vessel disease like slow heart rate, worsening heart failure, heart block, sick sinus syndrome or Raynaud's disease -kidney disease -liver disease -lung or breathing disease, like asthma or  emphysema -pheochromocytoma -thyroid disease -an unusual or allergic reaction to metoprolol, other beta-blockers, medicines, foods, dyes, or preservatives -pregnant or trying to get pregnant -breast-feeding How should I use this medicine? Take this medicine by mouth with a glass of water. Follow the directions on the prescription label. Do not crush or chew. Take this medicine with or immediately after meals. Take your doses at regular intervals. Do not take more medicine than directed. Do not stop taking this medicine suddenly. This could lead to serious heart-related effects. Talk to your pediatrician regarding the use of this medicine in children. While this drug may be prescribed for children as young as 6 years for selected conditions, precautions do apply. Overdosage: If you think you have taken too much of this medicine contact a poison control center or emergency room at once. NOTE: This medicine is only for you. Do not share this medicine with others. What if I miss a dose? If you miss a dose, take it as soon as you can. If it is almost time for your next dose, take only that dose. Do not take double or extra doses. What may interact with this medicine? This medicine may interact with the following medications: -certain medicines for blood pressure, heart disease, irregular heart beat -certain medicines for depression, like monoamine oxidase (MAO) inhibitors, fluoxetine, or paroxetine -clonidine -dobutamine -epinephrine -isoproterenol -reserpine This list may not describe  all possible interactions. Give your health care provider a list of all the medicines, herbs, non-prescription drugs, or dietary supplements you use. Also tell them if you smoke, drink alcohol, or use illegal drugs. Some items may interact with your medicine. What should I watch for while using this medicine? Visit your doctor or health care professional for regular check ups. Contact your doctor right away if your  symptoms worsen. Check your blood pressure and pulse rate regularly. Ask your health care professional what your blood pressure and pulse rate should be, and when you should contact them. You may get drowsy or dizzy. Do not drive, use machinery, or do anything that needs mental alertness until you know how this medicine affects you. Do not sit or stand up quickly, especially if you are an older patient. This reduces the risk of dizzy or fainting spells. Contact your doctor if these symptoms continue. Alcohol may interfere with the effect of this medicine. Avoid alcoholic drinks. What side effects may I notice from receiving this medicine? Side effects that you should report to your doctor or health care professional as soon as possible: -allergic reactions like skin rash, itching or hives -cold or numb hands or feet -depression -difficulty breathing -faint -fever with sore throat -irregular heartbeat, chest pain -rapid weight gain -swollen legs or ankles Side effects that usually do not require medical attention (report to your doctor or health care professional if they continue or are bothersome): -anxiety or nervousness -change in sex drive or performance -dry skin -headache -nightmares or trouble sleeping -short term memory loss -stomach upset or diarrhea -unusually tired This list may not describe all possible side effects. Call your doctor for medical advice about side effects. You may report side effects to FDA at 1-800-FDA-1088. Where should I keep my medicine? Keep out of the reach of children. Store at room temperature between 15 and 30 degrees C (59 and 86 degrees F). Throw away any unused medicine after the expiration date. NOTE: This sheet is a summary. It may not cover all possible information. If you have questions about this medicine, talk to your doctor, pharmacist, or health care provider.    2016, Elsevier/Gold Standard. (2012-10-21 14:41:37)

## 2015-08-20 NOTE — Progress Notes (Signed)
Patient Name: Kirk Lucas Date of Encounter: 08/20/2015   SUBJECTIVE  Breathing bette.  No chest pain. Ambulated yesterday.   CURRENT MEDS . aspirin EC  81 mg Oral Daily  . enoxaparin (LOVENOX) injection  40 mg Subcutaneous Q24H  . furosemide  40 mg Intravenous Daily  . ipratropium-albuterol  3 mL Nebulization BID  . metoprolol tartrate  25 mg Oral BID  . pantoprazole  40 mg Oral Daily  . sodium chloride flush  3 mL Intravenous Q12H  . sodium chloride flush  3 mL Intravenous Q12H    OBJECTIVE  Filed Vitals:   08/19/15 1214 08/19/15 1945 08/20/15 0500 08/20/15 0906  BP:  130/63 98/70   Pulse:  70 71 71  Temp:  98.2 F (36.8 C) 97.8 F (36.6 C)   TempSrc:  Oral Oral   Resp:  18 18 18   Height:      Weight:   315 lb 3.2 oz (142.974 kg)   SpO2: 95% 91% 94% 95%    Intake/Output Summary (Last 24 hours) at 08/20/15 0935 Last data filed at 08/20/15 0913  Gross per 24 hour  Intake   1080 ml  Output   3475 ml  Net  -2395 ml   Filed Weights   08/18/15 0629 08/19/15 0507 08/20/15 0500  Weight: 319 lb 9.6 oz (144.97 kg) 317 lb (143.79 kg) 315 lb 3.2 oz (142.974 kg)    PHYSICAL EXAM  General: Pleasant obese male in sitting in chair  NAD. Nasal cannula in place.  Neuro: Alert and oriented X 3. Moves all extremities spontaneously. Psych: Normal affect. HEENT:  Normal  Neck: Supple without bruits or JVD. Lungs:  Resp regular and unlabored, diffuse wheezing with diminished breath sound throughout Heart: RRR no s3, s4, or murmurs. Abdomen: Soft, non-tender, non-distended, BS + x 4.  Extremities: No clubbing, cyanosis. Trace to 1+ BL edema R> L.  Some erythema and mild blistering of the left leg,  DP/PT/Radials 2+ and equal bilaterally.  Accessory Clinical Findings  CBC  Recent Labs  08/18/15 0523  WBC 7.1  HGB 15.9  HCT 52.0  MCV 95.2  PLT 153   Basic Metabolic Panel  Recent Labs  08/18/15 0523 08/20/15 0340  NA 138 137  K 4.1 4.3  CL 94* 95*  CO2 36*  34*  GLUCOSE 101* 101*  BUN 12 16  CREATININE 0.95 1.07  CALCIUM 8.6* 9.2   Liver Function Tests No results for input(s): AST, ALT, ALKPHOS, BILITOT, PROT, ALBUMIN in the last 72 hours. No results for input(s): LIPASE, AMYLASE in the last 72 hours. Cardiac Enzymes  Recent Labs  08/18/15 1814 08/19/15 0032 08/19/15 0831  CKTOTAL 55 37* 45*  CKMB 1.3 1.3 1.6    TELE  Sinus rhythm. PVCS  Radiology/Studies  X-ray Chest Pa And Lateral  08/13/2015  CLINICAL DATA:  Abnormal EKG today.  Lower extremity swelling. EXAM: CHEST  2 VIEW COMPARISON:  PA and lateral chest 08/13/2015. FINDINGS: Lung volumes are somewhat low. No focal airspace disease identified. Peribronchial thickening is noted. No pneumothorax or pleural effusion. Heart size is normal. IMPRESSION: Bronchitic change without focal process. Electronically Signed   By: Drusilla Kanner M.D.   On: 08/13/2015 19:21   Dg Chest 2 View  08/13/2015  CLINICAL DATA:  Cough and shortness of breath for 2 weeks EXAM: CHEST  2 VIEW COMPARISON:  None. FINDINGS: There is no edema or consolidation. Heart is upper normal in size with pulmonary vascularity within normal limits. No  adenopathy. No pneumothorax. No bone lesions. IMPRESSION: No edema or consolidation. Electronically Signed   By: Bretta BangWilliam  Woodruff III M.D.   On: 08/13/2015 12:51   Ct Angio Chest Pe W/cm &/or Wo Cm  08/14/2015  CLINICAL DATA:  Intermittent shortness of breath. Lower extremity edema EXAM: CT ANGIOGRAPHY CHEST WITH CONTRAST TECHNIQUE: Multidetector CT imaging of the chest was performed using the standard protocol during bolus administration of intravenous contrast. Multiplanar CT image reconstructions and MIPs were obtained to evaluate the vascular anatomy. CONTRAST:  100 mL Isovue 370 nonionic COMPARISON:  Chest radiograph August 13, 2015 FINDINGS: Mediastinum/Lymph Nodes: There is no demonstrable pulmonary embolus. There is no thoracic aortic aneurysm or dissection. There are  scattered foci of calcification in the aorta. The visualized great vessels appear unremarkable except for slight calcification at the origins of the right common and left subclavian arteries. Pericardium is not appreciably thickened. Heart is mildly enlarged. There are scattered foci of coronary artery calcification. There is prominence of the left hemi- azygous vein of uncertain etiology. There is a small calcification in the left lobe of the thyroid. Thyroid otherwise appears unremarkable. There are multiple small mediastinal lymph nodes. There is no adenopathy by size criteria. Lungs/Pleura: There is patchy atelectasis in both posterior lung bases. On axial slice 43 series 6, there is a 5 x 4 mm nodular opacity in the posterior segment of the right upper lobe. Upper abdomen: In the visualized upper abdomen, there is incomplete visualization of apparent cholelithiasis. Visualized upper abdominal structures otherwise appear unremarkable. Musculoskeletal: There is degenerative change in the thoracic spine. There are no blastic or lytic bone lesions. Review of the MIP images confirms the above findings. IMPRESSION: No demonstrable pulmonary embolus. There is patchy bibasilar atelectasis. There is a 5 mm nodular opacity in the posterior segment right upper lobe. No follow-up needed if patient is low-risk. Non-contrast chest CT can be considered in 12 months if patient is high-risk. This recommendation follows the consensus statement: Guidelines for Management of Incidental Pulmonary Nodules Detected on CT Images:From the Fleischner Society 2017; published online before print (10.1148/[id number]). No demonstrable adenopathy. Incomplete visualization of apparent cholelithiasis. Scattered foci of coronary artery calcification. Prominence of the left hemiazygous vein, a finding of uncertain etiology or clinical significance. Electronically Signed   By: Bretta BangWilliam  Woodruff III M.D.   On: 08/14/2015 14:35     ASSESSMENT AND PLAN  1. 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 LV systolic function is normal and the diastolic function also appears to be normal. Net diuresis of negative 14.9 L. 32lb weight loss (347-->315). Baseline less than 310lb?Marland Kitchen. Close to euvolemic. Will switch to PO lasix. - Treat underlying condition including obesity, management of sleep apnea .   2. NSVT - No further NSVT on increased dose of BB. However continues to have PVCs. Wheezing noted today.   3.   Morbid obesity -BMI 46 - He will benefits from weight loss. Encouraged.   4. Possible sleep apnea - consider outpatient evaluation.   5. Wheezing noted - on increased dose of BB. Follow closely.   Dispo: Discharge likely tomorrow. Will need pulmonary and sleep study evaluation as outpatient. Given HF education.   Lorelei PontSigned, Bhagat,Bhavinkumar PA-C Pager [phone]  Patient seen and examined and history reviewed. Agree with above findings and plan. Continues to diurese well. Weight down another 2 lbs. PVCs noted on monitor but no VT. Will switch lasix to po today. Should be able  to be DC today. Follow up as outpatient. Will need outpatient sleep study. Discussed sodium restriction with him. Recommend daily weights.   Peter Sw[place], MDFACC 08/20/2015 9:44 AM

## 2015-08-28 ENCOUNTER — Encounter: Payer: Self-pay | Admitting: Cardiology

## 2015-08-28 ENCOUNTER — Ambulatory Visit (INDEPENDENT_AMBULATORY_CARE_PROVIDER_SITE_OTHER): Payer: Self-pay | Admitting: Cardiology

## 2015-08-28 VITALS — BP 140/82 | HR 69 | Ht 72.0 in | Wt 306.8 lb

## 2015-08-28 DIAGNOSIS — I50811 Acute right heart failure: Secondary | ICD-10-CM

## 2015-08-28 DIAGNOSIS — I5033 Acute on chronic diastolic (congestive) heart failure: Secondary | ICD-10-CM

## 2015-08-28 DIAGNOSIS — I4729 Other ventricular tachycardia: Secondary | ICD-10-CM

## 2015-08-28 DIAGNOSIS — I509 Heart failure, unspecified: Secondary | ICD-10-CM

## 2015-08-28 DIAGNOSIS — I472 Ventricular tachycardia: Secondary | ICD-10-CM

## 2015-08-28 LAB — BASIC METABOLIC PANEL
BUN: 20 mg/dL (ref 7–25)
CHLORIDE: 96 mmol/L — AB (ref 98–110)
CO2: 32 mmol/L — ABNORMAL HIGH (ref 20–31)
Calcium: 9.5 mg/dL (ref 8.6–10.3)
Creat: 1.07 mg/dL (ref 0.70–1.33)
GLUCOSE: 100 mg/dL — AB (ref 65–99)
POTASSIUM: 4.6 mmol/L (ref 3.5–5.3)
Sodium: 138 mmol/L (ref 135–146)

## 2015-08-28 NOTE — Patient Instructions (Addendum)
Medication Instructions:  Your physician recommends that you continue on your current medications as directed. Please refer to the Current Medication list given to you today.   Labwork: TODAY:  BMET  Testing/Procedures: None ordered  Follow-Up: Your physician recommends that you schedule a follow-up appointment in: 2-3 MONTHS WITH DR. Elease HashimotoNAHSER   Any Other Special Instructions Will Be Listed Below (If Applicable).     If you need a refill on your cardiac medications before your next appointment, please call your pharmacy.

## 2015-08-28 NOTE — Progress Notes (Signed)
08/28/2015 Kirk Lucas   11/09/1960  [id number]  Primary Physician No PCP Per Patient Primary Cardiologist: Dr. Elease HashimotoNahser   [facility] Visit/CC: [facility] Right Sided [facility] Failure  HPI:  55 y/o obese male who presents to clinic for post hospital f/u. 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 No evidence of pulmonary emboli on CT. LV systolic function was normal and his diastolic function also appeared to be normal. He was treated with IV lasix. Net diuresis of negative 14.9 L. 32lb weight loss (347-->315). Baseline weight was felt to be less than 310lb. He was switchted switch to PO lasix. Recommendations was to treat underlying condition including obesity and management of sleep apnea.   He presents to clinic for post hospital f/u. Breathing is improved. Still with trace bilateral LEE. No CP. He reports full medication compliance. He has been checking his weight daily at home and has continued to loose weight. Discharge weight was 315 lb. Office weight today is 308 lb. He has reduced his sodium intake. He has a f/u opt to see a new PCP in a week. Plan is for OP sleep study. He feels that he likely has OSA. He has been told that he snores when he sleeps and has apenic spells.    Current Outpatient Prescriptions  Medication Sig Dispense Refill  . aspirin EC 81 MG EC tablet Take 1 tablet (81 mg total) by mouth daily. 30 tablet 0  . furosemide (LASIX) 40 MG tablet Take 1 tablet (40 mg total) by mouth daily. 30 tablet 0  . metoprolol tartrate (LOPRESSOR) 25 MG tablet Take 1 tablet (25 mg total) by mouth 2 (two) times daily. 60 tablet 0   No current facility-administered medications for this visit.    Allergies  Allergen Reactions  . Codeine Nausea Only  . Duricef [Cefadroxil] Itching    Social History   Social History  . Marital Status: Single    Spouse Name: N/A  . Number of  Children: N/A  . Years of Education: N/A   Occupational History  . Not on file.   Social History Main Topics  . Smoking status: Current Every Day Smoker -- 1.00 packs/day    Types: Cigarettes  . Smokeless tobacco: Not on file  . Alcohol Use: No  . Drug Use: No  . Sexual Activity: Not on file   Other Topics Concern  . Not on file   Social History Narrative   Single, lives with his sister. Currently unemployed, he has worked in [employer] in the past.     Review of Systems: General: negative for chills, fever, night sweats or weight changes.  Cardiovascular: negative for chest pain, dyspnea on exertion, edema, orthopnea, palpitations, paroxysmal nocturnal dyspnea or shortness of breath Dermatological: negative for rash Respiratory: negative for cough or wheezing Urologic: negative for hematuria Abdominal: negative for nausea, vomiting, diarrhea, bright red blood per rectum, melena, or hematemesis Neurologic: negative for visual changes, syncope, or dizziness All other systems reviewed and are otherwise negative except as noted above.    Blood pressure 140/82, pulse 69, height 6' (1.829 m), weight 306 lb 12.8 oz (139.164 kg).  General appearance: alert, cooperative, no distress and moderately obese Neck: no carotid bruit and no JVD Lungs: clear to auscultation bilaterally Heart: regular rate and rhythm, S1, S2 normal, no murmur, click, rub or gallop Extremities: trace bilateral LEE Pulses: 2+ and symmetric Skin: warm  and dry Neurologic: Grossly normal  EKG NSR. No ischemia.   ASSESSMENT AND PLAN:   1. 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 LV systolic function was normal and his diastolic function also appeared to be normal. He was treated with IV lasix. Net diuresis of negative 14.9 L. 32lb weight loss (347-->315). Baseline less than 310lb. His weight  has remained stable since discharge. Office weight is 308 lb. His breathing has improved. Only trace bilateral LEE noted on exam. We will continue him on lasix. Will check a f/u BMP today to assess renal function and K. Continue daily weights + low sodium diet. Patient instructed to notify our office if > 3 lb weight gain in 24 hrs of 5 lb weight gain in 1 week. We also discussed need for OP stress test. He has f/u with PCP in 1 week. He plans on discussing this at his next appointment. I also advised weight loss. He has started walking for exercise. Patient was encouraged to continue this.   PLAN  BMP today to assess renal function and K given treatment with PO Lasix. F/u with Dr. Melburn PopperNasher in 2-3 months.   Kirk LisBrittainy Kevionna Heffler PA-C 08/28/2015 10:34 AM

## 2015-09-09 ENCOUNTER — Encounter: Payer: Self-pay | Admitting: Cardiology

## 2015-09-20 ENCOUNTER — Ambulatory Visit: Payer: Self-pay | Admitting: Family Medicine

## 2015-09-21 ENCOUNTER — Other Ambulatory Visit: Payer: Self-pay | Admitting: Cardiology

## 2015-09-21 ENCOUNTER — Telehealth: Payer: Self-pay | Admitting: Cardiology

## 2015-09-21 MED ORDER — FUROSEMIDE 40 MG PO TABS: 40.0000 mg | ORAL_TABLET | Freq: Every day | ORAL | Status: DC

## 2015-09-21 MED ORDER — METOPROLOL TARTRATE 25 MG PO TABS: 25.0000 mg | ORAL_TABLET | Freq: Two times a day (BID) | ORAL | Status: DC

## 2015-09-21 NOTE — Telephone Encounter (Signed)
Rx for lasix reordered.   Makesha Belitz Sharol Harness

## 2015-11-21 ENCOUNTER — Encounter: Payer: Self-pay | Admitting: Cardiovascular Disease

## 2015-11-21 ENCOUNTER — Ambulatory Visit (INDEPENDENT_AMBULATORY_CARE_PROVIDER_SITE_OTHER): Payer: Self-pay | Admitting: Cardiovascular Disease

## 2015-11-21 ENCOUNTER — Encounter (INDEPENDENT_AMBULATORY_CARE_PROVIDER_SITE_OTHER): Payer: Self-pay

## 2015-11-21 VITALS — BP 98/64 | HR 60 | Ht 72.5 in | Wt 305.8 lb

## 2015-11-21 DIAGNOSIS — I272 Other secondary pulmonary hypertension: Secondary | ICD-10-CM

## 2015-11-21 DIAGNOSIS — I509 Heart failure, unspecified: Secondary | ICD-10-CM

## 2015-11-21 DIAGNOSIS — G4730 Sleep apnea, unspecified: Secondary | ICD-10-CM

## 2015-11-21 DIAGNOSIS — I50811 Acute right heart failure: Secondary | ICD-10-CM

## 2015-11-21 LAB — BASIC METABOLIC PANEL
BUN: 17 mg/dL (ref 7–25)
CALCIUM: 9.5 mg/dL (ref 8.6–10.3)
CHLORIDE: 96 mmol/L — AB (ref 98–110)
CO2: 32 mmol/L — AB (ref 20–31)
CREATININE: 1.05 mg/dL (ref 0.70–1.33)
Glucose, Bld: 87 mg/dL (ref 65–99)
Potassium: 4.5 mmol/L (ref 3.5–5.3)
Sodium: 140 mmol/L (ref 135–146)

## 2015-11-21 MED ORDER — FUROSEMIDE 40 MG PO TABS: 40.0000 mg | ORAL_TABLET | Freq: Two times a day (BID) | ORAL | 11 refills | Status: DC

## 2015-11-21 MED ORDER — ALBUTEROL SULFATE HFA 108 (90 BASE) MCG/ACT IN AERS: 2.0000 | INHALATION_SPRAY | Freq: Four times a day (QID) | RESPIRATORY_TRACT | 2 refills | Status: DC | PRN

## 2015-11-21 NOTE — Progress Notes (Signed)
11/21/2015 Kirk Lucas   20-Nov-1960  [id number]  Primary Physician No PCP Per Patient Primary Cardiologist: Dr. Acie Fredrickson   Reason for Visit/CC: [facility] F/u for Right Sided Heart Failure  HPI:  55 y/o obese male who presents to clinic for post hospital f/u. 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 No evidence of pulmonary emboli on CT. LV systolic function was normal and his diastolic function also appeared to be normal. He was treated with IV lasix. Net diuresis of negative 14.9 L. 32lb weight loss (347-->315). Baseline weight was felt to be less than 310lb. He was switchted switch to PO lasix. Recommendations was to treat underlying condition including obesity and management of sleep apnea.   He presents to clinic for post hospital f/u. Breathing is improved. Still with trace bilateral LEE. No CP. He reports full medication compliance. He has been checking his weight daily at home and has continued to loose weight. Discharge weight was 315 lb. Office weight today is 308 lb. He has reduced his sodium intake. He has a f/u opt to see a new PCP in a week. Plan is for OP sleep study. He feels that he likely has OSA. He has been told that he snores when he sleeps and has apenic spells.    11/21/2015:  Mr. Kirk Lucas is a 55 year old gentleman who I met in the hospital in June, 2017. He has a history of pulmonary hypertension, obesity hypoventilation.  Thinks that his breathing is a little bit rough. Wants to take the lasix BID.  He's taken his Lasix twice a day on some occasions when he felt that his breathing was short. Has some leg cramping the day after taking the BID lasix .  No productive cough No fever.   Current Outpatient Prescriptions  Medication Sig Dispense Refill  . aspirin EC 81 MG EC tablet Take 1 tablet (81 mg total) by mouth daily. 30 tablet 0  . furosemide (LASIX) 40 MG tablet  Take 1 tablet (40 mg total) by mouth daily. 30 tablet 5  . metoprolol tartrate (LOPRESSOR) 25 MG tablet Take 1 tablet (25 mg total) by mouth 2 (two) times daily. 60 tablet 5   No current facility-administered medications for this visit.     Allergies  Allergen Reactions  . Codeine Nausea Only  . Duricef [Cefadroxil] Itching    Social History   Social History  . Marital status: Single    Spouse name: N/A  . Number of children: N/A  . Years of education: N/A   Occupational History  . Not on file.   Social History Main Topics  . Smoking status: Current Every Day Smoker    Packs/day: 1.00    Types: Cigarettes  . Smokeless tobacco: Not on file  . Alcohol use No  . Drug use: No  . Sexual activity: Not on file   Other Topics Concern  . Not on file   Social History Narrative   Single, lives with his sister. Currently unemployed, he has worked in [employer] in the past.     Review of Systems: General: negative for chills, fever, night sweats or weight changes.  Cardiovascular: negative for chest pain, dyspnea on exertion, edema, orthopnea, palpitations, paroxysmal nocturnal dyspnea or shortness of breath Dermatological: negative for rash Respiratory: negative for cough or wheezing Urologic: negative for hematuria Abdominal: negative for nausea, vomiting, diarrhea, bright red blood per rectum, melena, or hematemesis Neurologic:  negative for visual changes, syncope, or dizziness All other systems reviewed and are otherwise negative except as noted above.    Blood pressure 98/64, pulse 60, height 6' 0.5" (1.842 m), weight (!) 305 lb 12.8 oz (138.7 kg).  General appearance: alert, cooperative, no distress and moderately obese Neck: no carotid bruit and no JVD Lungs: bilateral wheezing  Heart: regular rate and rhythm, S1, S2 normal, no murmur, click, rub or gallop Extremities: trace bilateral LEE, Chronic stasis changes - especially in the left calf.  Pulses: 2+  and symmetric Skin: warm and dry Neurologic: Grossly normal  EKG NSR. No ischemia.   ASSESSMENT AND PLAN:   1. 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  I do not think this is a primary cardiac issue.    LV systolic function was normal and his diastolic function also appeared to be normal.  He has RV failure related to Obesity hypoventilation and OSA.   He was treated with IV lasix. Net diuresis of negative 14.9 L. 32lb weight loss (347-->305)   Has cut out his salt.    O2 sats are 88% on RA today  + wheezing on exam Will see if we can get him in to see pulmonary today  He will need home O2 if he does not improve quickly .  Would prefer to have Pulmonary medicine manage his home O2 .  Add albuterol Increase lasix to 40 BID  Check BMP today  Will order a sleep study .  Follow up with me in 3 months

## 2015-11-21 NOTE — Patient Instructions (Addendum)
Medication Instructions:  INCREASE Lasix (Furosemide) to 40 mg twice daily START Albuterol inhaler 2 puffs every 6 hours as needed   Labwork: TODAY - basic metabolic panel   Testing/Procedures: Your physician has recommended that you have a sleep study. This test records several body functions during sleep, including: brain activity, eye movement, oxygen and carbon dioxide blood levels, heart rate and rhythm, breathing rate and rhythm, the flow of air through your mouth and nose, snoring, body muscle movements, and chest and belly movement.   Follow-Up: Your physician recommends that you schedule a follow-up appointment in: 3 months with Dr. Elease HashimotoNahser.   You have been referred to Pulmonary Medicine - appointment tomorrow Sept. 22 at 9:30 am with Dr. Bland SpanWert Arrive at [address] at 9:15 am    If you need a refill on your cardiac medications before your next appointment, please call your pharmacy.   Thank you for choosing [facility]! Eligha BridegroomMichelle Swinyer, RN [phone]

## 2015-11-22 ENCOUNTER — Encounter: Payer: Self-pay | Admitting: Internal Medicine

## 2015-11-22 ENCOUNTER — Ambulatory Visit (INDEPENDENT_AMBULATORY_CARE_PROVIDER_SITE_OTHER): Payer: Self-pay | Admitting: Internal Medicine

## 2015-11-22 VITALS — BP 124/70 | HR 58 | Ht 72.5 in | Wt 307.6 lb

## 2015-11-22 DIAGNOSIS — J9611 Chronic respiratory failure with hypoxia: Secondary | ICD-10-CM

## 2015-11-22 DIAGNOSIS — I272 Other secondary pulmonary hypertension: Secondary | ICD-10-CM

## 2015-11-22 DIAGNOSIS — J9612 Chronic respiratory failure with hypercapnia: Secondary | ICD-10-CM

## 2015-11-22 DIAGNOSIS — J449 Chronic obstructive pulmonary disease, unspecified: Secondary | ICD-10-CM

## 2015-11-22 DIAGNOSIS — I2781 Cor pulmonale (chronic): Secondary | ICD-10-CM

## 2015-11-22 MED ORDER — UMECLIDINIUM-VILANTEROL 62.5-25 MCG/INH IN AEPB
1.0000 | INHALATION_SPRAY | Freq: Every day | RESPIRATORY_TRACT | 3 refills | Status: DC
Start: 2015-11-22 — End: 2015-12-26

## 2015-11-22 NOTE — Progress Notes (Signed)
Subjective:    Patient ID: Kirk Lucas, male    DOB: 1960/10/25,     MRN: [id number]  HPI  55 yowm quit smoking  08/2015 good athlete in school at 190  with onset in 41's of doe at wt 235 much worse 2017 assoc with leg swelling much worse 2 weeks pta with wheezing/coughing    Admit date: 08/13/2015 Discharge date: 08/20/2015  Recommendations for Outpatient Follow-up:  Continue metoprolol and lasix on discharge as prescribed. Follow up with cardio per scheduled appointment.  Discharge Diagnoses:  Principal Problem:   Respiratory failure with hypoxia (HCC) Active Problems:   Acute right-sided CHF (congestive heart failure) (HCC)   CHF- etiology not yet determined   Morbid obesity -BMI 46   Smoker   Sleep apnea by history   Pulmonary hypertension-PA 52 mmHg by echo   NSVT (nonsustained ventricular tachycardia) (HCC)   Family history of coronary artery disease   Hypoxia    Discharge Condition: stable   Diet recommendation: as tolerated   History of present illness:  55 year old male with past medical history of COPD, hypertension, smoker who [facility] with increasing weight and edema for past 2 weeks prior to this admission. On admission, BNP was 340, CXR showed no edema or consolidation. CT chest showed no pulmonary embolism but there is right lung opacity which based on patient's history of smoking will need follow-up in 12 months. Since admission patient had several runs on NSWCT. Seen by cardiology in consultation.    Hospital Course:  Assessment & Plan:  Acute right sided CHF with cor pulmonale / Possible obesity hypoventilation syndrome / acute respiratory failure with hypoxia - Stable respiratory status - Weight in past 72 hours: 146.3 kg --> 11.67 kg --> 143.79 kg --> 142.9 kg - Per cardio, lasix 40 mg daily - Continue metoprolol 25 mg BID on discharge  - Potassium and renal function are within normal  limits  Nonsustained ventricular tachycardia - Asymptomatic  Essential hypertension - Continue metoprolol 25 mg twice daily  Morbid obesity due to excess calories - Body mass index is 42.98 kg/(m^2). - Counseled on diet    DVT prophylaxis: Lovenox suBQ  Code Status: full code  Family Communication: no family at the bedside this am    Consultants:   Cardiology  Procedures:   2 D ECHO 08/14/2015 - EF 55%      11/22/2015 1st [facility] office visit/ Zaydn Gutridge  Re GOLD IV COPD/ MO Chief Complaint  Patient presents with  . Advice Only    Referred by Dr. Elease Hashimoto for hypoxia, pulm htn.  pt c/o increased SOB Xseveral years.   presenlty sob x 50 ft walking slow pace and not using any inhalers or 02   No obvious day to day or daytime variability or assoc excess/ purulent sputum or mucus plugs or hemoptysis or cp or chest tightness, subjective wheeze or overt sinus or hb symptoms. No unusual exp hx or h/o childhood pna/ asthma or knowledge of premature birth.  Sleeping ok without nocturnal  or early am exacerbation  of respiratory  c/o's or need for noct saba. Also denies any obvious fluctuation of symptoms with weather or environmental changes or other aggravating or alleviating factors except as outlined above   Current Medications, Allergies, Complete Past Medical History, Past Surgical History, Family History, and Social History were reviewed in [employer] record.  ROS  The following are not active complaints unless bolded  sore throat, dysphagia, dental problems, itching, sneezing,  nasal congestion or excess/ purulent secretions, ear ache,   fever, chills, sweats, unintended wt loss, classically pleuritic or exertional cp,  orthopnea pnd or leg swelling, presyncope, palpitations, abdominal pain, anorexia, nausea, vomiting, diarrhea  or change in bowel or bladder habits, change in stools or urine, dysuria,hematuria,  rash, arthralgias, visual  complaints, headache, numbness, weakness or ataxia or problems with walking or coordination,  change in mood/affect or memory.            Review of Systems  Constitutional: Negative for fever and unexpected weight change.  HENT: Positive for postnasal drip. Negative for congestion, dental problem, ear pain, nosebleeds, rhinorrhea, sinus pressure, sneezing, sore throat and trouble swallowing.   Eyes: Negative for redness and itching.  Respiratory: Positive for cough and shortness of breath. Negative for chest tightness and wheezing.   Cardiovascular: Negative for palpitations and leg swelling.  Gastrointestinal: Negative for nausea and vomiting.  Genitourinary: Negative for dysuria.  Musculoskeletal: Negative for joint swelling.  Skin: Negative for rash.  Neurological: Negative for headaches.  Hematological: Does not bruise/bleed easily.  Psychiatric/Behavioral: Negative for dysphoric mood. The patient is not nervous/anxious.        Objective:   Physical Exam  Obese amb wm nad  Wt Readings from Last 3 Encounters:  11/22/15 (!) 307 lb 9.6 oz (139.5 kg)  11/21/15 (!) 305 lb 12.8 oz (138.7 kg)  08/28/15 (!) 306 lb 12.8 oz (139.2 kg)    Vital signs reviewed   HEENT: nl dentition, turbinates, and oropharynx. Nl external ear canals without cough reflex   NECK :  without JVD/Nodes/TM/ nl carotid upstrokes bilaterally   LUNGS: no acc muscle use,  Nl contour chest with very distant bs/ faint late exp wheeze worse with fvc/ better with plm   CV:  RRR  no s3 or murmur or increase in P2, no edema   ABD:  Massively obese but soft and nontender with nl inspiratory excursion in the supine position. No bruits or organomegaly, bowel sounds nl  MS:  Nl gait/ ext warm without deformities, calf tenderness, cyanosis or clubbing No obvious joint restrictions   SKIN: warm and dry without lesions    NEURO:  alert, approp, nl sensorium with  no motor deficits     I personally reviewed  images and agree with radiology impression as follows:  CTa Chest  08/14/15  No demonstrable pulmonary embolus. There is patchy bibasilar atelectasis.  There is a 5 mm nodular opacity in the posterior segment right upper lobe. No follow-up needed if patient is low-risk.      Assessment & Plan:

## 2015-11-22 NOTE — Patient Instructions (Addendum)
Wear 02 2lpm as much as you can at 2lpm    Plan A = Automatic =   Anoro 1 click each am,  take two deep drags to make sure you get it all   Plan B = Backup Only use your albuterol as a rescue medication to be used if you can't catch your breath by resting or doing a relaxed purse lip breathing pattern.  - The less you use it, the better it will work when you need it. - Ok to use the inhaler up to 2 puffs  every 4 hours if you must but call for appointment if use goes up over your usual need - Don't leave home without it !!  (think of it like the spare tire for your car)     Please schedule a follow up office visit in 4 weeks, sooner if needed

## 2015-11-23 DIAGNOSIS — J449 Chronic obstructive pulmonary disease, unspecified: Secondary | ICD-10-CM | POA: Insufficient documentation

## 2015-11-23 NOTE — Assessment & Plan Note (Signed)
Quit smoking 08/2015 Spirometry 11/22/2015  FEV1 0.91 (22%)  Ratio 45    Rec trial of anoro x one month then regroup  Here  11/22/2015  After extensive coaching dpi effectiveness =    90%   Total time devoted to counseling  = 35/4259m review case with pt/ discussion of options/alternatives/ personally creating written instructions  in presence of pt  then going over those specific  Instructions directly with the pt including how to use all of the meds but in particular covering each new medication in detail and the difference between the maintenance/automatic meds and the prns using an action plan format for the latter.

## 2015-11-23 NOTE — Assessment & Plan Note (Signed)
Body mass index is 41.14 kg/m.  Lab Results  Component Value Date   TSH 4.308 08/13/2015     Contributing to gerd tendency/ doe/reviewed the need and the process to achieve and maintain neg calorie balance > defer f/u primary care including intermittently monitoring thyroid status

## 2015-11-23 NOTE — Assessment & Plan Note (Addendum)
HCO3  11/21/15  =  32 11/22/2015 Patient Saturations on Room Air at Rest = 89%                  Room Air while Ambulating = 83%/  2 Liters of oxygen while Ambulating = 96%  Ideally need to keep sats > 90% at all times due to cor pulmonale but he appears very reluctant to use it.  For now rec 2lpm 24/7 or as much as he is willing

## 2015-11-23 NOTE — Assessment & Plan Note (Signed)
08/14/15 Echo Left ventricle: The cavity size was normal. There was mild   concentric hypertrophy. Systolic function was normal. The   estimated ejection fraction was in the range of 55% to 60%. Wall   motion was normal; there were no regional wall motion   abnormalities. - Ventricular septum: The contour showed diastolic flattening. - Mitral valve: Calcified annulus. - Left atrium: The atrium was mildly dilated. - Right ventricle: The cavity size was moderately dilated. Wall   thickness was normal. Systolic function was moderately to   severely reduced. - Right atrium: The atrium was severely dilated. - Pulmonary arteries: Systolic pressure was moderately increased.   PA peak pressure: 52 mm Hg (S).  rx is to treat the underlying dz

## 2015-12-02 ENCOUNTER — Ambulatory Visit: Payer: Self-pay | Admitting: Family Medicine

## 2015-12-26 ENCOUNTER — Encounter: Payer: Self-pay | Admitting: Internal Medicine

## 2015-12-26 ENCOUNTER — Ambulatory Visit (INDEPENDENT_AMBULATORY_CARE_PROVIDER_SITE_OTHER): Payer: Self-pay | Admitting: Internal Medicine

## 2015-12-26 VITALS — BP 126/84 | HR 69 | Ht 72.0 in | Wt 322.0 lb

## 2015-12-26 DIAGNOSIS — J9611 Chronic respiratory failure with hypoxia: Secondary | ICD-10-CM

## 2015-12-26 DIAGNOSIS — J449 Chronic obstructive pulmonary disease, unspecified: Secondary | ICD-10-CM

## 2015-12-26 DIAGNOSIS — I2781 Cor pulmonale (chronic): Secondary | ICD-10-CM

## 2015-12-26 DIAGNOSIS — J9612 Chronic respiratory failure with hypercapnia: Secondary | ICD-10-CM

## 2015-12-26 DIAGNOSIS — R911 Solitary pulmonary nodule: Secondary | ICD-10-CM

## 2015-12-26 DIAGNOSIS — Z23 Encounter for immunization: Secondary | ICD-10-CM

## 2015-12-26 MED ORDER — INDACATEROL-GLYCOPYRROLATE 27.5-15.6 MCG IN CAPS: 1.0000 | ORAL_CAPSULE | Freq: Two times a day (BID) | RESPIRATORY_TRACT | 0 refills | Status: DC

## 2015-12-26 NOTE — Assessment & Plan Note (Signed)
HCO3  11/21/15  =  32 11/22/2015 Patient Saturations on Room Air at Rest = 89%                  Room Air while Ambulating = 83%/  2 Liters of oxygen while Ambulating = 96%  Never got the 02 ? Why but feeling better and back to work so first check ono RA and then titrate up but no need daytime for now

## 2015-12-26 NOTE — Assessment & Plan Note (Signed)
Quit smoking 08/2015 Spirometry 11/22/2015  FEV1 0.91 (22%)  Ratio 45  - Trial of utibron one bid 12/26/2015 >   Pt is Group B in terms of symptom/risk and laba/lama therefore appropriate rx at this point.    no anoro samples so try utibron one bid   Each maintenance medication was reviewed in detail including most importantly the difference between maintenance and as needed and under what circumstances the prns are to be used.  Please see instructions for details which were reviewed in writing and the patient given a copy.

## 2015-12-26 NOTE — Progress Notes (Signed)
Subjective:    Patient ID: Kirk Lucas, male    DOB: 1960-05-03,     MRN: [id number]    Brief patient profile:  55 yowm quit smoking  08/2015 good athlete in school at wt 190  with onset in 30's of doe at wt 235 much worse 2017 assoc with leg swelling much worse 2 weeks pta with wheezing/coughing    Admit date: 08/13/2015 Discharge date: 08/20/2015  Recommendations for Outpatient Follow-up:  Continue metoprolol and lasix on discharge as prescribed. Follow up with cardio per scheduled appointment.  Discharge Diagnoses:  Principal Problem:   Respiratory failure with hypoxia (HCC) Active Problems:   Acute right-sided CHF (congestive heart failure) (HCC)   CHF- etiology not yet determined   Morbid obesity -BMI 46   Smoker   Sleep apnea by history   Pulmonary hypertension-PA 52 mmHg by echo   NSVT (nonsustained ventricular tachycardia) (HCC)   Family history of coronary artery disease   Hypoxia    Discharge Condition: stable   Diet recommendation: as tolerated   History of present illness:  55 year old male with past medical history of COPD, hypertension, smoker who [facility] with increasing weight and edema for past 2 weeks prior to this admission. On admission, BNP was 340, CXR showed no edema or consolidation. CT chest showed no pulmonary embolism but there is right lung opacity which based on patient's history of smoking will need follow-up in 12 months. Since admission patient had several runs on NSWCT. Seen by cardiology in consultation.    Hospital Course:  Assessment & Plan:  Acute right sided CHF with cor pulmonale / Possible obesity hypoventilation syndrome / acute respiratory failure with hypoxia - Stable respiratory status - Weight in past 72 hours: 146.3 kg --> 11.67 kg --> 143.79 kg --> 142.9 kg - Per cardio, lasix 40 mg daily - Continue metoprolol 25 mg BID on discharge  - Potassium and renal  function are within normal limits  Nonsustained ventricular tachycardia - Asymptomatic  Essential hypertension - Continue metoprolol 25 mg twice daily  Morbid obesity due to excess calories - Body mass index is 42.98 kg/(m^2). - Counseled on diet    DVT prophylaxis: Lovenox suBQ  Code Status: full code  Family Communication: no family at the bedside this am    Consultants:   Cardiology  Procedures:   2 D ECHO 08/14/2015 - EF 55%      11/22/2015 1st [facility] office visit/ Kirk Lucas  Re GOLD IV COPD/ MO Chief Complaint  Patient presents with  . Advice Only    Referred by Dr. Elease Hashimoto for hypoxia, pulm htn.  pt c/o increased SOB Xseveral years.   presenlty sob x 50 ft walking slow pace and not using any inhalers or 02  rec Wear 02 2lpm as much as you can at 2lpm  Plan A = Automatic =   Anoro 1 click each am,  take two deep drags to make sure you get it all  Plan B = Backup Only use your albuterol    12/26/2015  f/u ov/Kirk Lucas re:  GOLD IV/ MO/ [facility]   Chief Complaint  Patient presents with  . Follow-up    Breathing has improved on Anoro and he has not had to use albuterol.    never got the 02 delivered. 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 No unusual exp hx or h/o childhood pna/ asthma or knowledge of premature birth.  Sleeping ok without nocturnal  or early am exacerbation  of respiratory  c/o's or need for noct saba. Also denies any obvious fluctuation of symptoms with weather or environmental changes or other aggravating or alleviating factors except as outlined above   Current Medications, Allergies, Complete Past Medical History, Past Surgical History, Family History, and Social History were  reviewed in [facility] electronic [facility] record.  ROS  The following are not active complaints unless bolded sore throat, dysphagia, dental problems, itching, sneezing,  nasal congestion or excess/ purulent secretions, ear ache,   fever, chills, sweats, unintended wt loss, classically pleuritic or exertional cp,  orthopnea pnd or leg swelling, presyncope, palpitations, abdominal pain, anorexia, nausea, vomiting, diarrhea  or change in bowel or bladder habits, change in stools or urine, dysuria,hematuria,  rash, arthralgias, visual complaints, headache, numbness, weakness or ataxia or problems with walking or coordination,  change in mood/affect or memory.              Objective:   Physical Exam  Obese amb wm nad   12/26/2015        322  11/22/15 (!) 307 lb 9.6 oz (139.5 kg)  11/21/15 (!) 305 lb 12.8 oz (138.7 kg)  08/28/15 (!) 306 lb 12.8 oz (139.2 kg)    Vital signs reviewed  - Note on arrival 02 sats  91 % on RA     HEENT: nl dentition, turbinates, and oropharynx. Nl external ear canals without cough reflex   NECK :  without JVD/Nodes/TM/ nl carotid upstrokes bilaterally   LUNGS: no acc muscle use,  Nl contour chest with very distant bs/ faint late exp wheeze worse with fvc/ better with plm   CV:  RRR  no s3 or murmur or increase in P2,  1+ ptting edema both lower ext   ABD:  Massively obese but soft and nontender with nl inspiratory excursion in the supine position. No bruits or organomegaly, bowel sounds nl  MS:  Nl gait/ ext warm without deformities, calf tenderness, cyanosis or clubbing No obvious joint restrictions   SKIN: warm and dry without lesions  / chronic venous changes both legs   NEURO:  alert, approp, nl sensorium with  no motor deficits     I personally reviewed images and agree with radiology impression as follows:  CTa Chest  08/14/15  No demonstrable pulmonary embolus. There is patchy bibasilar atelectasis.  There is a 5 mm nodular opacity in  the posterior segment right upper lobe. No follow-up needed if patient is low-risk.      Assessment & Plan:

## 2015-12-26 NOTE — Patient Instructions (Addendum)
Please see patient coordinator before you leave today  to schedule overnight oximetry on Room air   Try Utibron  one capsule twice a day   Please schedule a follow up office visit in 4 weeks, sooner if needed   .

## 2015-12-26 NOTE — Assessment & Plan Note (Signed)
F/u cards pending

## 2015-12-30 DIAGNOSIS — R911 Solitary pulmonary nodule: Secondary | ICD-10-CM | POA: Insufficient documentation

## 2015-12-30 NOTE — Assessment & Plan Note (Signed)
See CT chest 08/14/15 5 mm RUL > reminder file for 08/13/16 as quit smoking in 08/2015

## 2016-01-02 ENCOUNTER — Encounter: Payer: Self-pay | Admitting: Internal Medicine

## 2016-01-06 ENCOUNTER — Telehealth: Payer: Self-pay | Admitting: Internal Medicine

## 2016-01-06 NOTE — Telephone Encounter (Signed)
Per MW- ONO on RA (01/02/16 APS) was abnormal  Per MW- pt needs o2 2lpm with sleep and needs repeat ONO on 2lpm  ATC, NA and no option to leave msg[facility]

## 2016-01-07 NOTE — Telephone Encounter (Signed)
ATC, NA and no option to leave msg 

## 2016-01-17 NOTE — Telephone Encounter (Signed)
ATC, NA and no option to leave msg 

## 2016-01-20 ENCOUNTER — Ambulatory Visit (HOSPITAL_BASED_OUTPATIENT_CLINIC_OR_DEPARTMENT_OTHER): Payer: Self-pay | Attending: Cardiovascular Disease

## 2016-01-22 ENCOUNTER — Encounter: Payer: Self-pay | Admitting: *Deleted

## 2016-01-22 NOTE — Telephone Encounter (Signed)
ATC, line rings, and then fast busy tone  Will mail him a letter

## 2016-01-24 ENCOUNTER — Encounter: Payer: Self-pay | Admitting: Internal Medicine

## 2016-01-28 ENCOUNTER — Ambulatory Visit: Payer: Self-pay | Admitting: Internal Medicine

## 2016-02-14 ENCOUNTER — Ambulatory Visit (INDEPENDENT_AMBULATORY_CARE_PROVIDER_SITE_OTHER): Payer: Self-pay | Admitting: Cardiovascular Disease

## 2016-02-14 ENCOUNTER — Encounter: Payer: Self-pay | Admitting: Cardiovascular Disease

## 2016-02-14 VITALS — BP 126/80 | HR 78 | Ht 72.0 in | Wt 332.8 lb

## 2016-02-14 DIAGNOSIS — I2781 Cor pulmonale (chronic): Secondary | ICD-10-CM

## 2016-02-14 NOTE — Progress Notes (Signed)
02/14/2016 Kirk Lucas   12-17-1960  [id number]  Primary Physician No PCP Per Patient Primary Cardiologist: Dr. Acie Fredrickson   Reason for Visit/CC: Kirk Lucas F/u for Right Sided Heart Failure  HPI:  55 y/o obese male who presents to clinic for post hospital f/u. 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 No evidence of pulmonary emboli on CT. LV systolic function was normal and his diastolic function also appeared to be normal. He was treated with IV lasix. Net diuresis of negative 14.9 L. 32lb weight loss (347-->315). Baseline weight was felt to be less than 310lb. He was switchted switch to PO lasix. Recommendations was to treat underlying condition including obesity and management of sleep apnea.   He presents to clinic for post hospital f/u. Breathing is improved. Still with trace bilateral LEE. No CP. He reports full medication compliance. He has been checking his weight daily at home and has continued to loose weight. Discharge weight was 315 lb. Office weight today is 308 lb. He has reduced his sodium intake. He has a f/u opt to see a new PCP in a week. Plan is for OP sleep study. He feels that he likely has OSA. He has been told that he snores when he sleeps and has apenic spells.    11/21/2015:  Kirk Lucas is a 57 year old gentleman who I met in the hospital in June, 2017. He has a history of pulmonary hypertension, obesity hypoventilation.  Thinks that his breathing is a little bit rough. Wants to take the lasix BID.  He's taken his Lasix twice a day on some occasions when he felt that his breathing was short. Has some leg cramping the day after taking the BID lasix .  No productive cough No fever.  Dec. 15, 2017:  Seen today for follow up of cor pulmonale. Has seen pulmonary  Is on a new inhaler Has gained weight. Has not been taken his lasix regularly since he is doing some side  work   Current Outpatient Prescriptions  Medication Sig Dispense Refill  . aspirin EC 81 MG EC tablet Take 1 tablet (81 mg total) by mouth daily. 30 tablet 0  . furosemide (LASIX) 40 MG tablet Take 1 tablet (40 mg total) by mouth 2 (two) times daily. 60 tablet 11  . Indacaterol-Glycopyrrolate (UTIBRON NEOHALER) 27.5-15.6 MCG CAPS Place 1 capsule into inhaler and inhale 2 (two) times daily. 60 capsule 0  . metoprolol tartrate (LOPRESSOR) 25 MG tablet Take 1 tablet (25 mg total) by mouth 2 (two) times daily. 60 tablet 5   No current facility-administered medications for this visit.     Allergies  Allergen Reactions  . Codeine Nausea Only  . Duricef [Cefadroxil] Itching    Social History   Social History  . Marital status: Single    Spouse name: N/A  . Number of children: N/A  . Years of education: N/A   Occupational History  . Not on file.   Social History Main Topics  . Smoking status: Former Smoker    Packs/day: 1.25    Years: 38.00    Types: Cigarettes    Quit date: 09/21/2015  . Smokeless tobacco: Never Used  . Alcohol use No  . Drug use: No  . Sexual activity: Not on file   Other Topics Concern  . Not on file   Social History Narrative   Single, lives with his sister. 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     Review of Systems: General: negative for chills, fever, night sweats or weight changes.  Cardiovascular: negative for chest pain, dyspnea on exertion, edema, orthopnea, palpitations, paroxysmal nocturnal dyspnea or shortness of breath Dermatological: negative for rash Respiratory: negative for cough or wheezing Urologic: negative for hematuria Abdominal: negative for nausea, vomiting, diarrhea, bright red blood per rectum, melena, or hematemesis Neurologic: negative for visual changes, syncope, or dizziness All other systems reviewed and are otherwise negative except as noted above.    Blood pressure 126/80, pulse  78, height 6' (1.829 m), weight (!) 332 lb 12.8 oz (151 kg).  General appearance: alert, cooperative, no distress and moderately obese Neck: no carotid bruit and no JVD Lungs: bilateral wheezing  Heart: regular rate and rhythm, S1, S2 normal, no murmur, click, rub or gallop Extremities: trace bilateral LEE, Chronic stasis changes - especially in the left calf.  Pulses: 2+ and symmetric Skin: warm and dry Neurologic: Grossly normal  EKG NSR. No ischemia.   ASSESSMENT AND PLAN:   1. 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  I do not think this is a primary cardiac issue.    LV systolic function was normal and his diastolic function also appeared to be normal.  He has RV failure related to Obesity hypoventilation and OSA.  Has cut out his salt.    Continue Lasix 40 BID  Metoprolol   I will see in 1 year.     Mertie Moores, MD  02/14/2016 10:04 AM    [address],  [place] [place], [place]  [phone] Pager [phone] Phone: [phone]; Fax: [phone]

## 2016-02-14 NOTE — Patient Instructions (Signed)
Medication Instructions:  Your physician recommends that you continue on your current medications as directed. Please refer to the Current Medication list given to you today.   Labwork: None Ordered   Testing/Procedures: None Ordered   Follow-Up: Your physician wants you to follow-up in: 1 year with Dr. Nahser.  You will receive a reminder letter in the mail two months in advance. If you don't receive a letter, please call our office to schedule the follow-up appointment.   If you need a refill on your cardiac medications before your next appointment, please call your pharmacy.   Thank you for choosing [facility]! Ajane Novella, RN [phone]    

## 2016-02-21 ENCOUNTER — Ambulatory Visit (INDEPENDENT_AMBULATORY_CARE_PROVIDER_SITE_OTHER): Payer: Self-pay | Admitting: Internal Medicine

## 2016-02-21 ENCOUNTER — Encounter: Payer: Self-pay | Admitting: Internal Medicine

## 2016-02-21 ENCOUNTER — Ambulatory Visit: Payer: Self-pay | Admitting: Internal Medicine

## 2016-02-21 VITALS — BP 140/90 | HR 56 | Ht 72.5 in | Wt 330.0 lb

## 2016-02-21 DIAGNOSIS — J9612 Chronic respiratory failure with hypercapnia: Secondary | ICD-10-CM

## 2016-02-21 DIAGNOSIS — J449 Chronic obstructive pulmonary disease, unspecified: Secondary | ICD-10-CM

## 2016-02-21 DIAGNOSIS — J9611 Chronic respiratory failure with hypoxia: Secondary | ICD-10-CM

## 2016-02-21 MED ORDER — INDACATEROL-GLYCOPYRROLATE 27.5-15.6 MCG IN CAPS: 1.0000 | ORAL_CAPSULE | Freq: Two times a day (BID) | RESPIRATORY_TRACT | 11 refills | Status: DC

## 2016-02-21 MED ORDER — INDACATEROL-GLYCOPYRROLATE 27.5-15.6 MCG IN CAPS: 1.0000 | ORAL_CAPSULE | Freq: Two times a day (BID) | RESPIRATORY_TRACT | 3 refills | Status: DC

## 2016-02-21 NOTE — Patient Instructions (Addendum)
You would qualify for disability and should apply now  Please schedule a follow up visit in 2 months but call sooner if needed

## 2016-02-21 NOTE — Progress Notes (Signed)
Subjective:    Patient ID: Kirk ReedyRobert Swartzlander, male    DOB: 10-14-1960,     MRN: [id number]    Brief patient profile:  55 yowm quit smoking  08/2015 good athlete in school at wt 190  with onset in 30's of doe at wt 235 much worse 2017 assoc with leg swelling much worse 2 weeks pta with wheezing/coughing    Admit date: 08/13/2015 Discharge date: 08/20/2015  Recommendations for Outpatient Follow-up:  Continue metoprolol and lasix on discharge as prescribed. Follow up with cardio per scheduled appointment.  Discharge Diagnoses:  Principal Problem:   Respiratory failure with hypoxia (HCC) Active Problems:   Acute right-sided CHF (congestive heart failure) (HCC)   CHF- etiology not yet determined   Morbid obesity -BMI 46   Smoker   Sleep apnea by history   Pulmonary hypertension-PA 52 mmHg by echo   NSVT (nonsustained ventricular tachycardia) (HCC)   Family history of coronary artery disease   Hypoxia    Discharge Condition: stable   Diet recommendation: as tolerated   History of present illness:  55 year old male with past medical history of COPD, hypertension, smoker who [facility] with increasing weight and edema for past 2 weeks prior to this admission. On admission, BNP was 340, CXR showed no edema or consolidation. CT chest showed no pulmonary embolism but there is right lung opacity which based on patient's history of smoking will need follow-up in 12 months. Since admission patient had several runs on NSWCT. Seen by cardiology in consultation.    Hospital Course:  Assessment & Plan:  Acute right sided CHF with cor pulmonale / Possible obesity hypoventilation syndrome / acute respiratory failure with hypoxia - Stable respiratory status - Weight in past 72 hours: 146.3 kg --> 11.67 kg --> 143.79 kg --> 142.9 kg - Per cardio, lasix 40 mg daily - Continue metoprolol 25 mg BID on discharge  - Potassium and renal  function are within normal limits  Nonsustained ventricular tachycardia - Asymptomatic  Essential hypertension - Continue metoprolol 25 mg twice daily  Morbid obesity due to excess calories - Body mass index is 42.98 kg/(m^2). - Counseled on diet    DVT prophylaxis: Lovenox suBQ  Code Status: full code  Family Communication: no family at the bedside this am    Consultants:   Cardiology  Procedures:   2 D ECHO 08/14/2015 - EF 55%      11/22/2015 1st [facility] office visit/ Sarajean Dessert  Re GOLD IV COPD/ MO Chief Complaint  Patient presents with  . Advice Only    Referred by Dr. Elease HashimotoNahser for hypoxia, pulm htn.  pt c/o increased SOB Xseveral years.   presenlty sob x 50 ft walking slow pace and not using any inhalers or 02  rec Wear 02 2lpm as much as you can at 2lpm  Plan A = Automatic =   Anoro 1 click each am,  take two deep drags to make sure you get it all  Plan B = Backup Only use your albuterol    12/26/2015  f/u ov/Karess Harner re:  GOLD IV/ MO/ OHS   Chief Complaint  Patient presents with  . Follow-up    Breathing has improved on Anoro and he has not had to use albuterol.    never got the 02 delivered. 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 Follow-up    Discuss ONO results. Pt states breathing has improved on Utibron. No new co's today.   off utibron x one week and can def tell the difference with more doe Cannot afford 02 as has no insurance > referred for disability   No obvious day to day or daytime variability or assoc excess/ purulent sputum or mucus plugs or hemoptysis or cp or chest tightness, subjective wheeze or overt sinus or hb  symptoms. No unusual exp hx or h/o childhood pna/ asthma or knowledge of premature birth.  Sleeping ok without nocturnal  or early am exacerbation  of respiratory  c/o's or need for noct saba. Also denies any obvious fluctuation of symptoms with weather or environmental changes or other aggravating or alleviating factors except as outlined above   Current Medications, Allergies, Complete Past Medical History, Past Surgical History, Family History, and Social History were reviewed in [facility] electronic medical record.  ROS  The following are not active complaints unless bolded sore throat, dysphagia, dental problems, itching, sneezing,  nasal congestion or excess/ purulent secretions, ear ache,   fever, chills, sweats, unintended wt loss, classically pleuritic or exertional cp,  orthopnea pnd or leg swelling, presyncope, palpitations, abdominal pain, anorexia, nausea, vomiting, diarrhea  or change in bowel or bladder habits, change in stools or urine, dysuria,hematuria,  rash, arthralgias, visual complaints, headache, numbness, weakness or ataxia or problems with walking or coordination,  change in mood/affect or memory.              Objective:   Physical Exam  Obese amb wm nad   02/21/2016       330  12/26/2015        322  11/22/15 (!) 307 lb 9.6 oz (139.5 kg)  11/21/15 (!) 305 lb 12.8 oz (138.7 kg)  08/28/15 (!) 306 lb 12.8 oz (139.2 kg)    Vital signs reviewed  - Note on arrival 02 sats  90 % on RA     HEENT: nl dentition, turbinates, and oropharynx. Nl external ear canals without cough reflex   NECK :  without JVD/Nodes/TM/ nl carotid upstrokes bilaterally   LUNGS: no acc muscle use,  Nl contour chest with very distant bs/ faint bilateral late exp wheeze worse with fvc/ better with plm   CV:  RRR  no s3 or murmur or increase in P2,  1+ ptting edema both lower ext   ABD:  Massively obese but soft and nontender with nl inspiratory excursion in the supine position. No  bruits or organomegaly, bowel sounds nl  MS:  Nl gait/ ext warm without deformities, calf tenderness, cyanosis or clubbing No obvious joint restrictions   SKIN: warm and dry without lesions  / chronic venous changes both legs   NEURO:  alert, approp, nl sensorium with  no motor deficits     I personally reviewed images and agree with radiology impression as follows:  CTa Chest  08/14/15  No demonstrable pulmonary embolus. There is patchy bibasilar atelectasis. There is a 5 mm nodular opacity in the posterior segment right upper lobe. No follow-up needed if patient is low-risk.      Assessment & Plan:

## 2016-02-25 NOTE — Assessment & Plan Note (Signed)
Body mass index is 44.14 =  worse Lab Results  Component Value Date   TSH 4.308 08/13/2015     Contributing to gerd tendency/ doe/reviewed the need and the process to achieve and maintain neg calorie balance > defer f/u primary care including intermittently monitoring thyroid status

## 2016-02-25 NOTE — Assessment & Plan Note (Signed)
Quit smoking 08/2015 Spirometry 11/22/2015  FEV1 0.91 (22%)  Ratio 45  - Trial of utibron one bid 12/26/2015 > improved   Main challenge is keeping him in samples long enough until he gets help with cost of meds/ insurance   Advised he is eligible for disability   I had an extended discussion with the patient reviewing all relevant studies completed to date and  lasting 10 minutes of a 15 minute visit    Each maintenance medication was reviewed in detail including most importantly the difference between maintenance and prns and under what circumstances the prns are to be triggered using an action plan format that is not reflected in the computer generated alphabetically organized AVS.    Please see AVS for unique instructions that I personally wrote and verbalized to the the pt in detail and then reviewed with pt  by my nurse highlighting any  changes in therapy recommended at today's visit to their plan of care.

## 2016-02-25 NOTE — Assessment & Plan Note (Signed)
HCO3  11/21/15  =  32 11/22/2015 Patient Saturations on Room Air at Rest = 89%                  Room Air while Ambulating = 83%/  2 Liters of oxygen while Ambulating = 96% - ONO 01/02/16 RA  02 sat < 89% x 6h 44 min > rec 2lpm and repeat ono on 2lpm 01/03/2016 >> did not do as cannot afford 02   Advised needs to stay off back while sleeping / wt loss or this problem will begin to affect his overall health eg cor pulmonale/ polycycthemia  Will discuss with [facility] what we can provide at no cost to him

## 2016-04-23 ENCOUNTER — Ambulatory Visit: Payer: Self-pay | Admitting: Internal Medicine

## 2016-05-07 ENCOUNTER — Ambulatory Visit (INDEPENDENT_AMBULATORY_CARE_PROVIDER_SITE_OTHER): Payer: Self-pay | Admitting: Internal Medicine

## 2016-05-07 ENCOUNTER — Encounter: Payer: Self-pay | Admitting: Internal Medicine

## 2016-05-07 VITALS — BP 104/60 | HR 63 | Temp 98.0°F | Ht 72.5 in | Wt 329.0 lb

## 2016-05-07 DIAGNOSIS — J9612 Chronic respiratory failure with hypercapnia: Secondary | ICD-10-CM

## 2016-05-07 DIAGNOSIS — J9611 Chronic respiratory failure with hypoxia: Secondary | ICD-10-CM

## 2016-05-07 DIAGNOSIS — J449 Chronic obstructive pulmonary disease, unspecified: Secondary | ICD-10-CM

## 2016-05-07 MED ORDER — AZITHROMYCIN 250 MG PO TABS: ORAL_TABLET | ORAL | 0 refills | Status: DC

## 2016-05-07 MED ORDER — INDACATEROL-GLYCOPYRROLATE 27.5-15.6 MCG IN CAPS: 1.0000 | ORAL_CAPSULE | Freq: Two times a day (BID) | RESPIRATORY_TRACT | 0 refills | Status: DC

## 2016-05-07 NOTE — Progress Notes (Signed)
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 Follow up with cardio per scheduled appointment.  Discharge Diagnoses:  Principal Problem:   Respiratory failure with hypoxia (HCC) Active Problems:   Acute right-sided CHF (congestive heart failure) (HCC)   CHF- etiology not yet determined   Morbid obesity -BMI 46   Smoker   Sleep apnea by history   Pulmonary hypertension-PA 52 mmHg by echo   NSVT (nonsustained ventricular tachycardia) (HCC)   Family history of coronary artery disease   Hypoxia    Discharge Condition: stable   Diet recommendation: as tolerated   History of present illness:  56 year old male with past medical history of COPD, hypertension, smoker who [facility] with increasing weight and edema for past 2 weeks prior to this admission. On admission, BNP was 340, CXR showed no edema or consolidation. CT chest showed no pulmonary embolism but there is right lung opacity which based on patient's history of smoking will need follow-up in 12 months. Since admission patient had several runs on NSWCT. Seen by cardiology in consultation.    Hospital Course:  Assessment & Plan:  Acute right sided CHF with cor pulmonale / Possible obesity hypoventilation syndrome / acute respiratory failure with hypoxia - Stable respiratory status - Weight in past 72 hours: 146.3 kg --> 11.67 kg --> 143.79 kg --> 142.9 kg - Per cardio, lasix 40 mg daily - Continue metoprolol 25 mg BID on discharge  - Potassium and renal  function are within normal limits  Nonsustained ventricular tachycardia - Asymptomatic  Essential hypertension - Continue metoprolol 25 mg twice daily  Morbid obesity due to excess calories - Body mass index is 42.98 kg/(m^2). - Counseled on diet    DVT prophylaxis: Lovenox suBQ  Code Status: full code  Family Communication: no family at the bedside this am    Consultants:   Cardiology  Procedures:   2 D ECHO 08/14/2015 - EF 55%      11/22/2015 1st [facility] office visit/ [name]  Re GOLD IV COPD/ MO Chief Complaint  Patient presents with  . Advice Only    Referred by Dr. Elease HashimotoNahser for hypoxia, pulm htn.  pt c/o increased SOB Xseveral years.   presenlty sob x 50 ft walking slow pace and not using any inhalers or 02  rec Wear 02 2lpm as much as you can at 2lpm  Plan A = Automatic =   Anoro 1 click each am,  take two deep drags to make sure you get it all  Plan B = Backup Only use your albuterol    12/26/2015  f/u ov/[name] re:  GOLD IV/ MO/ OHS   Chief Complaint  Patient presents with  . Follow-up    Breathing has improved on Anoro and he has not had to use albuterol.    never got the 02 delivered. 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 Follow-up    Discuss ONO results. Pt states breathing has improved on Utibron. No new co's today.   off utibron x one week and can def tell the difference with more doe Cannot afford 02 as has no insurance > referred for disability  rec Apply for disability   05/07/2016  f/u ov/[name] re:  GOLD IV/ MO/ no funds for meds/ 02  Chief Complaint  Patient presents with  . Follow-up    Pt c/o increased SOB and  prod cough with green sputum for the past several wks.  Sats on RA at rest today are 74% and this increased to 90% 4lpm o2.    has not gone to social security office yet   Does better while on utibron then runs out and gradually worse x sev weeks                 Objective:   Physical Exam  Obese amb wm nad  05/07/2016           329  02/21/2016       330  12/26/2015        322  11/22/15 (!) 307 lb 9.6 oz (139.5 kg)  11/21/15 (!) 305 lb 12.8 oz (138.7 kg)  08/28/15 (!) 306 lb 12.8 oz (139.2 kg)    Vital signs reviewed  - Note on arrival 02 sats  90 % on RA     HEENT: nl dentition, turbinates, and oropharynx. Nl external ear canals without cough reflex   NECK :  without JVD/Nodes/TM/ nl carotid upstrokes bilaterally   LUNGS: no acc muscle use,  Nl contour chest with very distant bs/ faint bilateral late exp wheeze worse with fvc/ better with plm   CV:  RRR  no s3 or murmur or increase in P2,  1+ ptting edema both lower ext with venous stasis changes also   ABD:  Massively obese but soft and nontender with nl inspiratory excursion in the supine position. No bruits or organomegaly, bowel sounds nl  MS:  Nl gait/ ext warm without deformities, calf tenderness, cyanosis or clubbing No obvious joint restrictions   SKIN: warm and dry without lesions  / chronic venous changes both legs   NEURO:  alert, approp, nl sensorium with  no motor deficits     I personally reviewed images and agree with radiology impression as follows:  CTa Chest  08/14/15  No demonstrable pulmonary embolus. There is patchy bibasilar atelectasis. There is a 5 mm nodular opacity in the posterior segment right upper lobe. No follow-up needed if patient is low-risk.      Assessment & Plan:

## 2016-05-07 NOTE — Patient Instructions (Signed)
zpak   Continue utibron as your are  I will check to see if we can get you oxygen thru [facility]  You need to file for disability now with the social security office   Please schedule a follow up office visit in 6 weeks, call sooner if needed

## 2016-05-08 NOTE — Assessment & Plan Note (Addendum)
Mild flare off lama/ laba   rec   zpak/ utibron samples given

## 2016-05-08 NOTE — Assessment & Plan Note (Signed)
HCO3  11/21/15  =  32 11/22/2015 Patient Saturations on Room Air at Rest = 89%                  Room Air while Ambulating = 83%/  2 Liters of oxygen while Ambulating = 96% - ONO 01/02/16 RA  02 sat < 89% x 6h 44 min > rec 2lpm and repeat ono on 2lpm 01/03/2016 >> did not do as cannot afford 02   Referred back to [facility] to see if can get 02 until funded 05/07/2016 as Saturations on Room Air at Rest = 74%----increased to 90% on 4lpm continuous o2

## 2016-05-12 ENCOUNTER — Emergency Department (HOSPITAL_BASED_OUTPATIENT_CLINIC_OR_DEPARTMENT_OTHER): Payer: Self-pay

## 2016-05-12 ENCOUNTER — Inpatient Hospital Stay (HOSPITAL_BASED_OUTPATIENT_CLINIC_OR_DEPARTMENT_OTHER)
Admission: EM | Admit: 2016-05-12 | Discharge: 2016-05-17 | DRG: 175 | Disposition: A | Discharge status: Home / Self Care | Payer: Self-pay | Attending: Internal Medicine | Admitting: Internal Medicine

## 2016-05-12 ENCOUNTER — Encounter (HOSPITAL_BASED_OUTPATIENT_CLINIC_OR_DEPARTMENT_OTHER): Payer: Self-pay | Admitting: Emergency Medicine

## 2016-05-12 DIAGNOSIS — I2609 Other pulmonary embolism with acute cor pulmonale: Secondary | ICD-10-CM

## 2016-05-12 DIAGNOSIS — I82411 Acute embolism and thrombosis of right femoral vein: Secondary | ICD-10-CM | POA: Diagnosis present

## 2016-05-12 DIAGNOSIS — J9621 Acute and chronic respiratory failure with hypoxia: Secondary | ICD-10-CM | POA: Diagnosis present

## 2016-05-12 DIAGNOSIS — I82431 Acute embolism and thrombosis of right popliteal vein: Secondary | ICD-10-CM | POA: Diagnosis present

## 2016-05-12 DIAGNOSIS — Z885 Allergy status to narcotic agent status: Secondary | ICD-10-CM

## 2016-05-12 DIAGNOSIS — J9611 Chronic respiratory failure with hypoxia: Secondary | ICD-10-CM | POA: Diagnosis present

## 2016-05-12 DIAGNOSIS — J449 Chronic obstructive pulmonary disease, unspecified: Secondary | ICD-10-CM | POA: Diagnosis present

## 2016-05-12 DIAGNOSIS — Z8249 Family history of ischemic heart disease and other diseases of the circulatory system: Secondary | ICD-10-CM

## 2016-05-12 DIAGNOSIS — Z833 Family history of diabetes mellitus: Secondary | ICD-10-CM

## 2016-05-12 DIAGNOSIS — J9622 Acute and chronic respiratory failure with hypercapnia: Secondary | ICD-10-CM | POA: Diagnosis present

## 2016-05-12 DIAGNOSIS — I5033 Acute on chronic diastolic (congestive) heart failure: Secondary | ICD-10-CM | POA: Diagnosis present

## 2016-05-12 DIAGNOSIS — R0902 Hypoxemia: Secondary | ICD-10-CM

## 2016-05-12 DIAGNOSIS — Z7982 Long term (current) use of aspirin: Secondary | ICD-10-CM

## 2016-05-12 DIAGNOSIS — Z881 Allergy status to other antibiotic agents status: Secondary | ICD-10-CM

## 2016-05-12 DIAGNOSIS — I82491 Acute embolism and thrombosis of other specified deep vein of right lower extremity: Secondary | ICD-10-CM | POA: Diagnosis present

## 2016-05-12 DIAGNOSIS — I5032 Chronic diastolic (congestive) heart failure: Secondary | ICD-10-CM | POA: Diagnosis present

## 2016-05-12 DIAGNOSIS — J9612 Chronic respiratory failure with hypercapnia: Secondary | ICD-10-CM

## 2016-05-12 DIAGNOSIS — Z823 Family history of stroke: Secondary | ICD-10-CM

## 2016-05-12 DIAGNOSIS — Z9981 Dependence on supplemental oxygen: Secondary | ICD-10-CM

## 2016-05-12 DIAGNOSIS — I2699 Other pulmonary embolism without acute cor pulmonale: Principal | ICD-10-CM | POA: Diagnosis present

## 2016-05-12 DIAGNOSIS — G4730 Sleep apnea, unspecified: Secondary | ICD-10-CM | POA: Diagnosis present

## 2016-05-12 DIAGNOSIS — Z6841 Body Mass Index (BMI) 40.0 and over, adult: Secondary | ICD-10-CM

## 2016-05-12 DIAGNOSIS — I110 Hypertensive heart disease with heart failure: Secondary | ICD-10-CM | POA: Diagnosis present

## 2016-05-12 DIAGNOSIS — Z79899 Other long term (current) drug therapy: Secondary | ICD-10-CM

## 2016-05-12 DIAGNOSIS — I2781 Cor pulmonale (chronic): Secondary | ICD-10-CM | POA: Diagnosis present

## 2016-05-12 DIAGNOSIS — Z87891 Personal history of nicotine dependence: Secondary | ICD-10-CM

## 2016-05-12 DIAGNOSIS — J9811 Atelectasis: Secondary | ICD-10-CM | POA: Diagnosis present

## 2016-05-12 DIAGNOSIS — G4733 Obstructive sleep apnea (adult) (pediatric): Secondary | ICD-10-CM

## 2016-05-12 DIAGNOSIS — I10 Essential (primary) hypertension: Secondary | ICD-10-CM

## 2016-05-12 HISTORY — DX: Essential (primary) hypertension: I10

## 2016-05-12 HISTORY — DX: Chronic obstructive pulmonary disease, unspecified: J44.9

## 2016-05-12 LAB — I-STAT ARTERIAL BLOOD GAS, ED
BICARBONATE: 25.6 mmol/L (ref 20.0–28.0)
O2 SAT: 87 %
PO2 ART: 55 mmHg — AB (ref 83.0–108.0)
TCO2: 27 mmol/L (ref 0–100)
pCO2 arterial: 43.7 mmHg (ref 32.0–48.0)
pH, Arterial: 7.376 (ref 7.350–7.450)

## 2016-05-12 LAB — CBC
HEMATOCRIT: 52.1 % — AB (ref 39.0–52.0)
Hemoglobin: 16.8 g/dL (ref 13.0–17.0)
MCH: 31.8 pg (ref 26.0–34.0)
MCHC: 32.2 g/dL (ref 30.0–36.0)
MCV: 98.5 fL (ref 78.0–100.0)
Platelets: 154 10*3/uL (ref 150–400)
RBC: 5.29 MIL/uL (ref 4.22–5.81)
RDW: 14.5 % (ref 11.5–15.5)
WBC: 13.9 10*3/uL — AB (ref 4.0–10.5)

## 2016-05-12 LAB — BASIC METABOLIC PANEL
Anion gap: 10 (ref 5–15)
BUN: 16 mg/dL (ref 6–20)
CHLORIDE: 99 mmol/L — AB (ref 101–111)
CO2: 28 mmol/L (ref 22–32)
Calcium: 8.9 mg/dL (ref 8.9–10.3)
Creatinine, Ser: 1.09 mg/dL (ref 0.61–1.24)
GFR calc Af Amer: >60 mL/min (ref >60)
GFR calc non Af Amer: >60 mL/min (ref >60)
GLUCOSE: 118 mg/dL — AB (ref 65–99)
POTASSIUM: 3.9 mmol/L (ref 3.5–5.1)
Sodium: 137 mmol/L (ref 135–145)

## 2016-05-12 LAB — BRAIN NATRIURETIC PEPTIDE: B Natriuretic Peptide: 111.9 pg/mL — ABNORMAL HIGH (ref 0.0–100.0)

## 2016-05-12 LAB — TROPONIN I: TROPONIN I: 0.03 ng/mL — AB (ref <0.03)

## 2016-05-12 MED ORDER — METHYLPREDNISOLONE SODIUM SUCC 125 MG IJ SOLR
125.0000 mg | Freq: Once | INTRAMUSCULAR | Status: AC
  Administered 2016-05-12: 125 mg via INTRAVENOUS
  Filled 2016-05-12: qty 2

## 2016-05-12 MED ORDER — ALBUTEROL SULFATE (2.5 MG/3ML) 0.083% IN NEBU
INHALATION_SOLUTION | RESPIRATORY_TRACT | Status: AC
  Administered 2016-05-12: 2.5 mg via RESPIRATORY_TRACT
  Filled 2016-05-12: qty 3

## 2016-05-12 MED ORDER — IPRATROPIUM-ALBUTEROL 0.5-2.5 (3) MG/3ML IN SOLN
3.0000 mL | Freq: Once | RESPIRATORY_TRACT | Status: AC
  Administered 2016-05-12: 3 mL via RESPIRATORY_TRACT

## 2016-05-12 MED ORDER — ACETAMINOPHEN 325 MG PO TABS: 650.0000 mg | ORAL_TABLET | Freq: Four times a day (QID) | ORAL | Status: DC | PRN

## 2016-05-12 MED ORDER — ASPIRIN EC 81 MG PO TBEC
81.0000 mg | DELAYED_RELEASE_TABLET | Freq: Every day | ORAL | Status: DC
  Administered 2016-05-13 – 2016-05-15 (×3): 81 mg via ORAL
  Filled 2016-05-12 (×3): qty 1

## 2016-05-12 MED ORDER — TRAMADOL HCL 50 MG PO TABS
50.0000 mg | ORAL_TABLET | Freq: Four times a day (QID) | ORAL | Status: DC | PRN
  Administered 2016-05-14 – 2016-05-17 (×3): 50 mg via ORAL
  Filled 2016-05-12 (×3): qty 1

## 2016-05-12 MED ORDER — ALBUTEROL SULFATE (2.5 MG/3ML) 0.083% IN NEBU
2.5000 mg | INHALATION_SOLUTION | Freq: Once | RESPIRATORY_TRACT | Status: AC
  Administered 2016-05-12: 2.5 mg via RESPIRATORY_TRACT

## 2016-05-12 MED ORDER — ALBUTEROL (5 MG/ML) CONTINUOUS INHALATION SOLN
10.0000 mg/h | INHALATION_SOLUTION | RESPIRATORY_TRACT | Status: DC
  Administered 2016-05-12: 10 mg/h via RESPIRATORY_TRACT
  Filled 2016-05-12: qty 20

## 2016-05-12 MED ORDER — HEPARIN BOLUS VIA INFUSION
5000.0000 [IU] | Freq: Once | INTRAVENOUS | Status: AC
  Administered 2016-05-12: 5000 [IU] via INTRAVENOUS

## 2016-05-12 MED ORDER — ONDANSETRON HCL 4 MG PO TABS
4.0000 mg | ORAL_TABLET | Freq: Four times a day (QID) | ORAL | Status: DC | PRN
Start: 2016-05-12 — End: 2016-05-17

## 2016-05-12 MED ORDER — ONDANSETRON HCL 4 MG/2ML IJ SOLN: 4.0000 mg | Freq: Four times a day (QID) | INTRAMUSCULAR | Status: DC | PRN

## 2016-05-12 MED ORDER — HEPARIN (PORCINE) IN NACL 100-0.45 UNIT/ML-% IJ SOLN
2500.0000 [IU]/h | INTRAMUSCULAR | Status: DC
  Administered 2016-05-12: 1700 [IU]/h via INTRAVENOUS
  Administered 2016-05-13: 2500 [IU]/h via INTRAVENOUS
  Administered 2016-05-13: 2100 [IU]/h via INTRAVENOUS
  Administered 2016-05-14: 2500 [IU]/h via INTRAVENOUS
  Filled 2016-05-12 (×4): qty 250

## 2016-05-12 MED ORDER — METOPROLOL TARTRATE 25 MG PO TABS
25.0000 mg | ORAL_TABLET | Freq: Two times a day (BID) | ORAL | Status: DC
  Administered 2016-05-12 – 2016-05-17 (×9): 25 mg via ORAL
  Filled 2016-05-12 (×10): qty 1

## 2016-05-12 MED ORDER — INDACATEROL-GLYCOPYRROLATE 27.5-15.6 MCG IN CAPS
1.0000 | ORAL_CAPSULE | Freq: Two times a day (BID) | RESPIRATORY_TRACT | Status: DC
  Administered 2016-05-13 – 2016-05-17 (×3): 1 via RESPIRATORY_TRACT

## 2016-05-12 MED ORDER — ALBUTEROL SULFATE (2.5 MG/3ML) 0.083% IN NEBU: 2.5000 mg | INHALATION_SOLUTION | RESPIRATORY_TRACT | Status: DC | PRN

## 2016-05-12 MED ORDER — SODIUM CHLORIDE 0.9% FLUSH
3.0000 mL | Freq: Two times a day (BID) | INTRAVENOUS | Status: DC
  Administered 2016-05-12 – 2016-05-16 (×8): 3 mL via INTRAVENOUS

## 2016-05-12 MED ORDER — IOPAMIDOL (ISOVUE-370) INJECTION 76%
100.0000 mL | Freq: Once | INTRAVENOUS | Status: AC | PRN
  Administered 2016-05-12: 100 mL via INTRAVENOUS

## 2016-05-12 MED ORDER — ACETAMINOPHEN 650 MG RE SUPP: 650.0000 mg | Freq: Four times a day (QID) | RECTAL | Status: DC | PRN

## 2016-05-12 MED ORDER — IPRATROPIUM-ALBUTEROL 0.5-2.5 (3) MG/3ML IN SOLN
RESPIRATORY_TRACT | Status: AC
  Administered 2016-05-12: 3 mL via RESPIRATORY_TRACT
  Filled 2016-05-12: qty 3

## 2016-05-12 MED ORDER — FUROSEMIDE 40 MG PO TABS
40.0000 mg | ORAL_TABLET | Freq: Two times a day (BID) | ORAL | Status: DC
  Administered 2016-05-13: 40 mg via ORAL
  Filled 2016-05-12: qty 1

## 2016-05-12 MED ORDER — LEVALBUTEROL HCL 0.63 MG/3ML IN NEBU: 0.6300 mg | INHALATION_SOLUTION | RESPIRATORY_TRACT | Status: DC | PRN

## 2016-05-12 NOTE — ED Notes (Signed)
7 beat run of VT on monitor. Denies pain. Pt sitting up in bed on breathing tx. Will let MD know.

## 2016-05-12 NOTE — ED Notes (Signed)
Back from U/s. Pt tolerated well.

## 2016-05-12 NOTE — Consult Note (Signed)
Name: Kirk Lucas MRN: [id number] DOB: January 04, 1961    ADMISSION DATE:  05/12/2016 CONSULTATION DATE:  05/12/16  REFERRING MD :  Ranae Palms - EDP  CHIEF COMPLAINT:  RLE edema   HISTORY OF PRESENT ILLNESS:  Kirk Lucas is a 56 y.o. male with a PMH as outlined below including O2 dependent COPD and former smoking hx (followed by Dr. Sherene Sires).  He presented to [facility] on 05/12/16 with right leg swelling that had worsened over the past 1 week and had started to become painful. He had mild redness in the popliteal fossa area. Has also had mild associated shortness of breath which had worsened over the past week. Denied any chest pain, cough, fevers/chills/sweats.  He had lower extremity duplex which revealed DVT in the right profunda femoral, superficial femoral and popliteal veins as well as small focus with an great saphenous vein. Also had CT of the chest which demonstrated acute PE, right greater than left (RV/LV ratio of 1).  He denies any hx of prior VTE, recent long trips, hemoptysis, hx malignancy.  Has had fairly sedentary lifestyle over past few weeks.  He was subsequently started on heparin and was transferred to [facility] for further evaluation and management. PCCM was asked to see in consultation.   PAST MEDICAL HISTORY :   has a past medical history of CHF (congestive heart failure) (HCC) (June 2017); COPD (chronic obstructive pulmonary disease) (HCC); Hypertension; Morbid obesity (HCC); Sleep apnea; and Smoker.  has a past surgical history that includes arm surgery and Wrist surgery. Prior to Admission medications   Medication Sig Start Date End Date Taking? Authorizing Provider  aspirin EC 81 MG EC tablet Take 1 tablet (81 mg total) by mouth daily. 08/20/15   Alison Murray, MD  azithromycin (ZITHROMAX) 250 MG tablet Take 2 on day one then 1 daily x 4 days 05/07/16   Nyoka Cowden, MD  furosemide (LASIX) 40 MG tablet Take 1 tablet (40 mg total) by mouth 2 (two) times daily. 11/21/15   Vesta Mixer, MD  Indacaterol-Glycopyrrolate (UTIBRON NEOHALER) 27.5-15.6 MCG CAPS Place 1 capsule into inhaler and inhale 2 (two) times daily. 05/07/16   Nyoka Cowden, MD  metoprolol tartrate (LOPRESSOR) 25 MG tablet Take 1 tablet (25 mg total) by mouth 2 (two) times daily. 09/21/15   Brittainy Sherlynn Carbon, PA-C   Allergies  Allergen Reactions  . Codeine Nausea Only  . Duricef [Cefadroxil] Itching    FAMILY HISTORY:  family history includes Congestive Heart Failure (age of onset: 48) in his father; Coronary artery disease (age of onset: 39) in his father and mother; Diabetes type II in his sister; Pulmonary disease in his father; Stroke (age of onset: 41) in his mother. SOCIAL HISTORY:  reports that he quit smoking about 7 months ago. His smoking use included Cigarettes. He has a 47.50 pack-year smoking history. He has never used smokeless tobacco. He reports that he does not drink alcohol or use drugs.  REVIEW OF SYSTEMS:   All negative; except for those that are bolded, which indicate positives.  Constitutional: weight loss, weight gain, night sweats, fevers, chills, fatigue, weakness.  HEENT: headaches, sore throat, sneezing, nasal congestion, post nasal drip, difficulty swallowing, tooth/dental problems, visual complaints, visual changes, ear aches. Neuro: difficulty with speech, weakness, numbness, ataxia. CV:  chest pain, orthopnea, PND, swelling in lower extremities, dizziness, palpitations, syncope.  Resp: cough, hemoptysis, dyspnea, wheezing. GI: heartburn, indigestion, abdominal pain, nausea, vomiting, diarrhea, constipation, change in bowel habits, loss of  appetite, hematemesis, melena, hematochezia.  GU: dysuria, change in color of urine, urgency or frequency, flank pain, hematuria. MSK: joint pain or swelling, decreased range of motion. Psych: change in mood or affect, depression, anxiety, suicidal ideations, homicidal ideations. Skin: rash, itching, bruising.    SUBJECTIVE:   Currently denies SOB though does have some SOB with exertion only.  Pain in RLE is improving.  VITAL SIGNS: Temp:  [97.9 F (36.6 C)] 97.9 F (36.6 C) (03/13 1522) Pulse Rate:  [55-88] 80 (03/13 1931) Resp:  [15-22] 20 (03/13 1931) BP: (94-124)/(61-90) 122/63 (03/13 1931) SpO2:  [80 %-98 %] 98 % (03/13 1931) Weight:  [149.2 kg (329 lb)] 149.2 kg (329 lb) (03/13 1524)  PHYSICAL EXAMINATION: General: Obese male, in NAD. Neuro: A&O x 3, no focal deficits. HEENT: [place] / AT. PERRL. Cardiovascular: RRR, no M/R/G. Lungs: Normal resp effort, clear bilaterally. Abdomen: Obese, BS hypoactive.  S/NT/ND. Musculoskeletal: BLE edema, R > L, venous stasis changes. Skin: Warm, dry.   Recent Labs Lab 05/12/16 1545  NA 137  K 3.9  CL 99*  CO2 28  BUN 16  CREATININE 1.09  GLUCOSE 118*    Recent Labs Lab 05/12/16 1545  HGB 16.8  HCT 52.1*  WBC 13.9*  PLT 154   Ct Angio Chest Pe W And/or Wo Contrast  Result Date: 05/12/2016 CLINICAL DATA:  Shortness of Breath and known right lower extremity deep venous thrombosis. EXAM: CT ANGIOGRAPHY CHEST WITH CONTRAST TECHNIQUE: Multidetector CT imaging of the chest was performed using the standard protocol during bolus administration of intravenous contrast. Multiplanar CT image reconstructions and MIPs were obtained to evaluate the vascular anatomy. CONTRAST:  100 mL Isovue 370 COMPARISON:  08/14/2015 FINDINGS: Cardiovascular: Thoracic aorta demonstrates atherosclerotic calcifications without aneurysmal dilatation or dissection. The pulmonary artery is well visualized with bilateral pulmonary filling defects consistent with emboli. These are much greater on the right than the left. The RV/LV ratio is 1 consistent with mild heart strain. Mediastinum/Nodes: The thoracic inlet is within normal limits. No significant hilar or mediastinal adenopathy is noted. Scattered small mediastinal lymph nodes are noted stable from the previous exam. Lungs/Pleura: 5 mm  nodule is again noted in the posterior aspect of the right upper lobe best seen on image number 45 of series 6. Mild left basilar atelectasis is noted. No sizable effusion or pneumothorax is noted. Upper Abdomen: Within normal limits. Musculoskeletal: Mild degenerative change of the thoracic spine is noted. No acute bony abnormality is seen. Review of the MIP images confirms the above findings. IMPRESSION: Positive for acute PE right greater than left with CT evidence of right heart strain (RV/LV Ratio = 1) consistent with at least submassive (intermediate risk) PE. The presence of right heart strain has been associated with an increased risk of morbidity and mortality. Please activate Code PE by paging [phone]. Stable 5 mm nodule in the right upper lobe. New left basilar atelectasis. Critical Value/emergent results were called by telephone at the time of interpretation on 05/12/2016 at 7:34 pm to Dr. Loren Racer , who verbally acknowledged these results. Electronically Signed   By: Alcide Clever M.D.   On: 05/12/2016 19:37   US Venous Img Lower Unilateral Right  Result Date: 05/12/2016 CLINICAL DATA:  Sudden onset right calf pain for several days, initial encounter EXAM: RIGHT LOWER EXTREMITY VENOUS DOPPLER ULTRASOUND TECHNIQUE: Gray-scale sonography with graded compression, as well as color Doppler and duplex ultrasound were performed to evaluate the lower extremity deep venous systems from the level of  the common femoral vein and including the common femoral, femoral, profunda femoral, popliteal and calf veins including the posterior tibial, peroneal and gastrocnemius veins when visible. The superficial great saphenous vein was also interrogated. Spectral Doppler was utilized to evaluate flow at rest and with distal augmentation maneuvers in the common femoral, femoral and popliteal veins. COMPARISON:  None. FINDINGS: Contralateral Common Femoral Vein: Respiratory phasicity is normal and symmetric with  the symptomatic side. No evidence of thrombus. Normal compressibility. Common Femoral Vein: No evidence of thrombus. Normal compressibility, respiratory phasicity and response to augmentation. Saphenofemoral Junction: No evidence of thrombus. Normal compressibility and flow on color Doppler imaging. Profunda Femoral Vein: Echogenic thrombus is noted within with decreased compressibility. Femoral Vein: Echogenic thrombus is noted within with decreased compressibility. Popliteal Vein: Echogenic thrombus is noted within with decreased compressibility. Calf Veins: No evidence of thrombus. Normal compressibility and flow on color Doppler imaging. Superficial Great Saphenous Vein: Mild thrombus is noted within the greater saphenous vein just below the saphenous femoral junction. Venous Reflux:  None. Other Findings:  None. IMPRESSION: Deep venous thrombosis within the right profunda femoral, superficial femoral and popliteal veins as well as a small focus within the greater saphenous vein. Electronically Signed   By: Alcide CleverMark  Lukens M.D.   On: 05/12/2016 18:32   Dg Chest Port 1 View  Result Date: 05/12/2016 CLINICAL DATA:  RIGHT leg swelling for 2 weeks.  Pain for 6 days EXAM: PORTABLE CHEST 1 VIEW COMPARISON:  CT 08/14/2015 FINDINGS: Normal mediastinum and cardiac silhouette. Chronic central bronchitic markings. Normal pulmonary vasculature. No effusion, infiltrate, or pneumothorax. IMPRESSION: Chronic bronchitic markings.  No acute findings Electronically Signed   By: Genevive BiStewart  Edmunds M.D.   On: 05/12/2016 16:22    STUDIES:  LE Duplex 3/13 > DVT in the right profunda femoral, superficial femoral and popliteal veins as well as small focus with an great saphenous vein.  CTA chest 3/13 > acute PE, right greater than left (RV/LV ratio of 1).  Stable 5mm nodule in RUL, new left basilar atelectasis.  SIGNIFICANT EVENTS  3/13 > admit.  ASSESSMENT / PLAN:  Acute PE, R > L - appears unprovoked; only risk factor is  obesity. RLE DVT. Acute on chronic hypoxic respiratory failure - due to COPD + new acute PE (of note is supposed to be on home O2 but has not as he has not been able to afford this). COPD without exacerbation. Atelectasis. Former smoker. Plan: Continue heparin drip. Transition to oral agent, warfarin probably more affordable though cost of copay visits with f/u checks may be prohibitory.  May need assistance from social work regarding possible options with NOAC's. Trend troponins. Assess echo. Defer hypercoagulable workup given that he has started heparin. Continue supplemental O2 as needed to maintain SpO2 > 92%. Continue BD's. Incentive spirometry. Mobilize as able.   Rest of issues per primary team.   Rutherford Guysahul Corrinne Benegas, PA - [facility] Pager: [phone]  or [phone] 05/12/2016, 11:10 PM

## 2016-05-12 NOTE — ED Notes (Signed)
Patient transported to Ultrasound. Jerrye BeaversHazel, RN accompanied pt

## 2016-05-12 NOTE — ED Notes (Signed)
Pt reports was prescribed o2 for home but could not afford it. Pt reports increase in leg swelling with pain to R leg behind the knee, no hx of DVT. Denies cough, denies chest pain.

## 2016-05-12 NOTE — ED Notes (Signed)
Pt transferred to U/s via stretcher and this RN stayed with pt in u/s. Pt has frequent PVC's and has sats to low 80's. O2 increased to 4Liters/m  [place]. Pt sats up to upper  80's.

## 2016-05-12 NOTE — Progress Notes (Signed)
ANTICOAGULATION CONSULT NOTE - Initial Consult  Pharmacy Consult for Heparin Indication: DVT  Allergies  Allergen Reactions  . Codeine Nausea Only  . Duricef [Cefadroxil] Itching    Patient Measurements: Height: 6' (182.9 cm) Weight: (!) 329 lb (149.2 kg) IBW/kg (Calculated) : 77.6 Heparin Dosing Weight: 112 (BMI 44.5)  Vital Signs: Temp: 97.9 F (36.6 C) (03/13 1522) Temp Source: Oral (03/13 1522) BP: 97/66 (03/13 1855) Pulse Rate: 85 (03/13 1855)  Labs:  Recent Labs  05/12/16 1545  HGB 16.8  HCT 52.1*  PLT 154  CREATININE 1.09  TROPONINI 0.03*    Estimated Creatinine Clearance: 117.7 mL/min (by C-G formula based on SCr of 1.09 mg/dL).   Medical History: Past Medical History:  Diagnosis Date  . CHF (congestive heart failure) (HCC) June 2017   normal LVF by echo  . COPD (chronic obstructive pulmonary disease) (HCC)   . Hypertension   . Morbid obesity (HCC)    BMI 46  . Sleep apnea    by history  . Smoker     Medications:  Scheduled:    Assessment: 56yo male presenting with worsening SOB and swelling of R-leg, doppler (+)DVT and to start heparin.  Pt is on no anticoagulants at home.  No baseline coag studies- no history of bleeding.    Goal of Therapy:  Heparin level 0.3-0.7 units/ml Monitor platelets by anticoagulation protocol: Yes   Plan:  Heparin 5000 units IV x 1, then 1700 units/hr Heparin level 6hr Daily heparin level, CBC Watch for s/s of bleeding  F/U plans for oral anticoagulation, note use of DOACs are not recommended in  pts > 120kg, BMI > 40.   Marisue HumbleKendra Marven Veley, PharmD Clinical Pharmacist [facility]

## 2016-05-12 NOTE — ED Notes (Signed)
ABG results shown to Dr. Ranae PalmsYelverton. Placed pt on 100% NRB at this time due to decreased PaO2. Pt in no respiratory distress at this time. No increased WOB noted.

## 2016-05-12 NOTE — Progress Notes (Signed)
Called by Dr. Ranae PalmsYelverton about this patient who presented with SOB x 1 week and R leg swelling. Has underlying COPD supposed to be on home O2 but couldn't afford it.  Is f/b Dr. Sherene SiresWert.  In ED pt hypoxemic SpO2 in the 80's, improved to 98-100% on a NRB mask.    CT angio of chest done showed "The pulmonary artery is well visualized with bilateral pulmonary filling defects consistent with emboli. These are much greater on the right than the left. The RV/LV ratio is 1 consistent with mild heart strain." And summary > "Positive PE right greater than left with CT evidence of right heart strain (RV/LV Ratio = 1) consistent with at least submassive (intermediate risk) PE."    BP 94- 124/ 63- 90.  HR stabe 70-80 range, RR 19-20.  ED MD discussed with CCM Dr Dellie CatholicSommers who said ok for admit to hospitalist and they will see in consultation. Not in distress on exam, R leg swollen.    Dopplers done tonight showed "Deep venous thrombosis within the right profunda femoral, superficial femoral and popliteal veins as well as a small focus within the greater saphenous vein. "  Has rec'd so far IV heparin bolus and drip,  Albuterol nebs and IV solumedrol 125 mg.   [facility]     Vinson Moselleob Amy Gothard MD [facility] pgr [phone] 05/12/2016, 8:43 PM

## 2016-05-12 NOTE — ED Provider Notes (Addendum)
[facility] Provider Note   CSN: [id number] Arrival date & time: 05/12/16  1518     History   Chief Complaint Chief Complaint  Patient presents with  . Shortness of Breath    HPI Kirk Lucas is a 56 y.o. male.  HPI Patient with history of COPD and CHF presents with swelling to the right leg for 2 weeks and over the last week has become painful. There is mild redness. Has ongoing shortness of breath which he states which has worsened over the last week. He denies any chest pain. Shortness of breath is worse with exertion and lying flat. He has ongoing wheezing and nonproductive cough. States he's been compliant with his Lasix. Past Medical History:  Diagnosis Date  . CHF (congestive heart failure) (HCC) June 2017   normal LVF by echo  . COPD (chronic obstructive pulmonary disease) (HCC)   . Hypertension   . Morbid obesity (HCC)    BMI 46  . Sleep apnea    by history  . Smoker     Patient Active Problem List   Diagnosis Date Noted  . Bilateral pulmonary embolism (HCC) 05/12/2016  . Solitary pulmonary nodule 12/30/2015  . COPD GOLD IV  11/23/2015  . Acute right-sided CHF (congestive heart failure) 08/19/2015  . Chronic respiratory failure with hypoxia and hypercapnia (HCC)   . Morbid (severe) obesity due to excess calories (HCC) 08/15/2015  . Cigarette smoker 08/15/2015  . Sleep apnea by history 08/15/2015  . Cor pulmonale, chronic (HCC) 08/15/2015  . NSVT (nonsustained ventricular tachycardia) (HCC) 08/15/2015  . Family history of coronary artery disease 08/15/2015  . Respiratory failure with hypoxia (HCC) 08/14/2015  . Chronic diastolic CHF (congestive heart failure) (HCC) 08/13/2015    Past Surgical History:  Procedure Laterality Date  . arm surgery    . WRIST SURGERY         Home Medications    Prior to Admission medications   Medication Sig Start Date End Date Taking? Authorizing Provider  aspirin EC 81 MG EC tablet Take 1 tablet (81 mg  total) by mouth daily. Patient taking differently: Take 81 mg by mouth 3 (three) times a week.  08/20/15  Yes Alison Murray, MD  calcium carbonate (TUMS EX) 750 MG chewable tablet Chew 2 tablets by mouth as needed for heartburn.   Yes Historical Provider, MD  furosemide (LASIX) 40 MG tablet Take 1 tablet (40 mg total) by mouth 2 (two) times daily. Patient taking differently: Take 40 mg by mouth See admin instructions. Take 40 mg by mouth daily. Take an extra 40 mg tablet if needed. 11/21/15  Yes Vesta Mixer, MD  hydrocortisone cream 1 % Apply 1 application topically as needed (for rosacea).   Yes Historical Provider, MD  Indacaterol-Glycopyrrolate (UTIBRON NEOHALER) 27.5-15.6 MCG CAPS Place 1 capsule into inhaler and inhale 2 (two) times daily. 05/07/16  Yes Nyoka Cowden, MD  metoprolol tartrate (LOPRESSOR) 25 MG tablet Take 1 tablet (25 mg total) by mouth 2 (two) times daily. 09/21/15  Yes Brittainy Sherlynn Carbon, PA-C  oxymetazoline (AFRIN) 0.05 % nasal spray Place 1-3 sprays into both nostrils 2 (two) times daily as needed for congestion.   Yes Historical Provider, MD    Family History Family History  Problem Relation Age of Onset  . Coronary artery disease Father 63    MI  . Congestive Heart Failure Father 90    died CHF commplications  . Pulmonary disease Father     pulmonary  edema per pt  . Coronary artery disease Mother 84    MI  . Stroke Mother 61    died after CVA  . Diabetes type II Sister     twin sister with DM    Social History Social History  Substance Use Topics  . Smoking status: Former Smoker    Packs/day: 1.25    Years: 38.00    Types: Cigarettes    Quit date: 09/21/2015  . Smokeless tobacco: Never Used  . Alcohol use No     Allergies   Codeine and Duricef [cefadroxil]   Review of Systems Review of Systems  Constitutional: Positive for chills and diaphoresis. Negative for fever.  Respiratory: Positive for cough, shortness of breath and wheezing.     Cardiovascular: Positive for leg swelling. Negative for chest pain and palpitations.  Gastrointestinal: Negative for abdominal distention, abdominal pain, constipation, diarrhea, nausea and vomiting.  Musculoskeletal: Positive for myalgias. Negative for back pain, neck pain and neck stiffness.  Skin: Negative for rash and wound.  Neurological: Negative for dizziness, weakness, light-headedness, numbness and headaches.  All other systems reviewed and are negative.    Physical Exam Updated Vital Signs BP 136/74 (BP Location: Right Arm)   Pulse 62   Temp 97.8 F (36.6 C) (Oral)   Resp 18   Ht 6' (1.829 m)   Wt (!) 328 lb 7.8 oz (149 kg)   SpO2 93%   BMI 44.55 kg/m   Physical Exam  Constitutional: He is oriented to person, place, and time. He appears well-developed and well-nourished. No distress.  Morbidly obese  HENT:  Head: Normocephalic and atraumatic.  Mouth/Throat: Oropharynx is clear and moist.  Eyes: EOM are normal. Pupils are equal, round, and reactive to light.  Neck: Normal range of motion. Neck supple.  Cardiovascular: Normal rate and regular rhythm.  Exam reveals no gallop and no friction rub.   No murmur heard. Pulmonary/Chest: Effort normal.  Expiratory wheezing throughout. Diminished breath sounds in bilateral bases.  Abdominal: Soft. Bowel sounds are normal. There is no tenderness. There is no rebound and no guarding.  Musculoskeletal: Normal range of motion. He exhibits tenderness. He exhibits no edema.  Right lower extremity is swollen compared to left. Erythematous around the ankle extending up the leg. Mild increased warmth compared to left leg. Patient has some tenderness over the medial thigh on the right side. No obvious masses. 2+ dorsalis pedis and posterior tibial pulses.  Neurological: He is alert and oriented to person, place, and time.  5/5 motor in all extremities. Sensation fully intact.  Skin: Skin is warm and dry. No rash noted. No erythema.   Psychiatric: He has a normal mood and affect. His behavior is normal.  Nursing note and vitals reviewed.    ED Treatments / Results  Labs (all labs ordered are listed, but only abnormal results are displayed) Labs Reviewed  BASIC METABOLIC PANEL - Abnormal; Notable for the following:       Result Value   Chloride 99 (*)    Glucose, Bld 118 (*)    All other components within normal limits  CBC - Abnormal; Notable for the following:    WBC 13.9 (*)    HCT 52.1 (*)    All other components within normal limits  BRAIN NATRIURETIC PEPTIDE - Abnormal; Notable for the following:    B Natriuretic Peptide 111.9 (*)    All other components within normal limits  TROPONIN I - Abnormal; Notable for the following:  Troponin I 0.03 (*)    All other components within normal limits  HEPARIN LEVEL (UNFRACTIONATED) - Abnormal; Notable for the following:    Heparin Unfractionated <0.10 (*)    All other components within normal limits  CBC - Abnormal; Notable for the following:    WBC 11.9 (*)    Platelets 138 (*)    All other components within normal limits  TROPONIN I - Abnormal; Notable for the following:    Troponin I 0.03 (*)    All other components within normal limits  URINALYSIS, ROUTINE W REFLEX MICROSCOPIC - Abnormal; Notable for the following:    Specific Gravity, Urine >1.046 (*)    Glucose, UA 50 (*)    Ketones, ur 5 (*)    Protein, ur 100 (*)    Bacteria, UA RARE (*)    Squamous Epithelial / LPF 0-5 (*)    All other components within normal limits  HEPARIN LEVEL (UNFRACTIONATED) - Abnormal; Notable for the following:    Heparin Unfractionated <0.10 (*)    All other components within normal limits  I-STAT ARTERIAL BLOOD GAS, ED - Abnormal; Notable for the following:    pO2, Arterial 55.0 (*)    All other components within normal limits  MRSA PCR SCREENING  TROPONIN I  HEPARIN LEVEL (UNFRACTIONATED)    EKG  EKG Interpretation  Date/Time:  Tuesday May 12 2016  15:32:12 EDT Ventricular Rate:  93 PR Interval:    QRS Duration: 105 QT Interval:  364 QTC Calculation: 453 R Axis:   79 Text Interpretation:  Sinus rhythm Baseline wander in lead(s) III aVL V3 Confirmed by Ranae Palms  MD, Pritika Alvarez ([id number]) on 05/12/2016 4:28:12 PM       Radiology Ct Angio Chest Pe W And/or Wo Contrast  Result Date: 05/12/2016 CLINICAL DATA:  Shortness of Breath and known right lower extremity deep venous thrombosis. EXAM: CT ANGIOGRAPHY CHEST WITH CONTRAST TECHNIQUE: Multidetector CT imaging of the chest was performed using the standard protocol during bolus administration of intravenous contrast. Multiplanar CT image reconstructions and MIPs were obtained to evaluate the vascular anatomy. CONTRAST:  100 mL Isovue 370 COMPARISON:  08/14/2015 FINDINGS: Cardiovascular: Thoracic aorta demonstrates atherosclerotic calcifications without aneurysmal dilatation or dissection. The pulmonary artery is well visualized with bilateral pulmonary filling defects consistent with emboli. These are much greater on the right than the left. The RV/LV ratio is 1 consistent with mild heart strain. Mediastinum/Nodes: The thoracic inlet is within normal limits. No significant hilar or mediastinal adenopathy is noted. Scattered small mediastinal lymph nodes are noted stable from the previous exam. Lungs/Pleura: 5 mm nodule is again noted in the posterior aspect of the right upper lobe best seen on image number 45 of series 6. Mild left basilar atelectasis is noted. No sizable effusion or pneumothorax is noted. Upper Abdomen: Within normal limits. Musculoskeletal: Mild degenerative change of the thoracic spine is noted. No acute bony abnormality is seen. Review of the MIP images confirms the above findings. IMPRESSION: Positive for acute PE right greater than left with CT evidence of right heart strain (RV/LV Ratio = 1) consistent with at least submassive (intermediate risk) PE. The presence of right heart strain  has been associated with an increased risk of morbidity and mortality. Please activate Code PE by paging [phone]. Stable 5 mm nodule in the right upper lobe. New left basilar atelectasis. Critical Value/emergent results were called by telephone at the time of interpretation on 05/12/2016 at 7:34 pm to Dr. Loren Racer , who verbally  acknowledged these results. Electronically Signed   By: Alcide CleverMark  Lukens M.D.   On: 05/12/2016 19:37   [place] Venous Img Lower Unilateral Right  Result Date: 05/12/2016 CLINICAL DATA:  Sudden onset right calf pain for several days, initial encounter EXAM: RIGHT LOWER EXTREMITY VENOUS DOPPLER ULTRASOUND TECHNIQUE: Gray-scale sonography with graded compression, as well as color Doppler and duplex ultrasound were performed to evaluate the lower extremity deep venous systems from the level of the common femoral vein and including the common femoral, femoral, profunda femoral, popliteal and calf veins including the posterior tibial, peroneal and gastrocnemius veins when visible. The superficial great saphenous vein was also interrogated. Spectral Doppler was utilized to evaluate flow at rest and with distal augmentation maneuvers in the common femoral, femoral and popliteal veins. COMPARISON:  None. FINDINGS: Contralateral Common Femoral Vein: Respiratory phasicity is normal and symmetric with the symptomatic side. No evidence of thrombus. Normal compressibility. Common Femoral Vein: No evidence of thrombus. Normal compressibility, respiratory phasicity and response to augmentation. Saphenofemoral Junction: No evidence of thrombus. Normal compressibility and flow on color Doppler imaging. Profunda Femoral Vein: Echogenic thrombus is noted within with decreased compressibility. Femoral Vein: Echogenic thrombus is noted within with decreased compressibility. Popliteal Vein: Echogenic thrombus is noted within with decreased compressibility. Calf Veins: No evidence of thrombus. Normal  compressibility and flow on color Doppler imaging. Superficial Great Saphenous Vein: Mild thrombus is noted within the greater saphenous vein just below the saphenous femoral junction. Venous Reflux:  None. Other Findings:  None. IMPRESSION: Deep venous thrombosis within the right profunda femoral, superficial femoral and popliteal veins as well as a small focus within the greater saphenous vein. Electronically Signed   By: Alcide CleverMark  Lukens M.D.   On: 05/12/2016 18:32   Dg Chest Port 1 View  Result Date: 05/12/2016 CLINICAL DATA:  RIGHT leg swelling for 2 weeks.  Pain for 6 days EXAM: PORTABLE CHEST 1 VIEW COMPARISON:  CT 08/14/2015 FINDINGS: Normal mediastinum and cardiac silhouette. Chronic central bronchitic markings. Normal pulmonary vasculature. No effusion, infiltrate, or pneumothorax. IMPRESSION: Chronic bronchitic markings.  No acute findings Electronically Signed   By: Genevive BiStewart  Edmunds M.D.   On: 05/12/2016 16:22    Procedures Procedures (including critical care time)  Medications Ordered in ED Medications  heparin ADULT infusion 100 units/mL (25000 units/25350mL sodium chloride 0.45%) (2,500 Units/hr Intravenous Rate/Dose Change 05/13/16 1448)  levalbuterol (XOPENEX) nebulizer solution 0.63 mg (not administered)  Indacaterol-Glycopyrrolate 27.5-15.6 MCG CAPS 1 capsule (1 capsule Inhalation Given by Other 05/13/16 1000)  metoprolol tartrate (LOPRESSOR) tablet 25 mg (25 mg Oral Given 05/13/16 0905)  aspirin EC tablet 81 mg (81 mg Oral Given 05/13/16 0905)  sodium chloride flush (NS) 0.9 % injection 3 mL (3 mLs Intravenous Given 05/13/16 0911)  acetaminophen (TYLENOL) tablet 650 mg (not administered)    Or  acetaminophen (TYLENOL) suppository 650 mg (not administered)  ondansetron (ZOFRAN) tablet 4 mg (not administered)    Or  ondansetron (ZOFRAN) injection 4 mg (not administered)  traMADol (ULTRAM) tablet 50 mg (not administered)  MEDLINE mouth rinse (15 mLs Mouth Rinse Given 05/13/16 0910)    furosemide (LASIX) injection 40 mg (not administered)  albuterol (PROVENTIL) (2.5 MG/3ML) 0.083% nebulizer solution 2.5 mg (2.5 mg Nebulization Given 05/12/16 1551)  ipratropium-albuterol (DUONEB) 0.5-2.5 (3) MG/3ML nebulizer solution 3 mL (3 mLs Nebulization Given 05/12/16 1552)  methylPREDNISolone sodium succinate (SOLU-MEDROL) 125 mg/2 mL injection 125 mg (125 mg Intravenous Given 05/12/16 1610)  iopamidol (ISOVUE-370) 76 % injection 100 mL (100 mLs Intravenous Contrast  Given 05/12/16 1913)  heparin bolus via infusion 5,000 Units (5,000 Units Intravenous Bolus from Bag 05/12/16 1939)  heparin bolus via infusion 4,000 Units (4,000 Units Intravenous Bolus from Bag 05/13/16 0555)  heparin bolus via infusion 4,000 Units (4,000 Units Intravenous Bolus from Bag 05/13/16 1449)    CRITICAL CARE Performed by: Ranae Palms, Dazia Lippold Total critical care time: 50 minutes Critical care time was exclusive of separately billable procedures and treating other patients. Critical care was necessary to treat or prevent imminent or life-threatening deterioration. Critical care was time spent personally by me on the following activities: development of treatment plan with patient and/or surrogate as well as nursing, discussions with consultants, evaluation of patient's response to treatment, examination of patient, obtaining history from patient or surrogate, ordering and performing treatments and interventions, ordering and review of laboratory studies, ordering and review of radiographic studies, pulse oximetry and re-evaluation of patient's condition. Initial Impression / Assessment and Plan / ED Course  I have reviewed the triage vital signs and the nursing notes.  Pertinent labs & imaging results that were available during my care of the patient were reviewed by me and considered in my medical decision making (see chart for details).    Patient with evidence of multiple DVTs on ultrasound of the right leg. Chest x-ray  without acute findings. Had multiple breathing treatments with little improvement of his hypoxia. Placed on nonrebreather. CT of the chest reveal bilateral PEs. There is some evidence right heart strain. Was started on heparin per pharmacy guidelines. Just with pulmonology who recommended hospitalist admit to stepdown. They will consult on the patient.  Discussed with Dr. Arlean Hopping. He will accept the patient in transfer to [facility]. Patient's vital signs remained stable while in the emergency department. Final Clinical Impressions(s) / ED Diagnoses   Final diagnoses:  Bilateral pulmonary embolism [facility])  Hypoxia    New Prescriptions Current Discharge Medication List       Loren Racer, MD 05/13/16 1655    Loren Racer, MD 05/13/16 [phone]

## 2016-05-12 NOTE — ED Triage Notes (Signed)
increasde sob x 1 week

## 2016-05-12 NOTE — ED Notes (Signed)
Left Radial Artery x 1 attempt for ABG. No complications noted. Bleeding stopped. ABG results pending.

## 2016-05-13 ENCOUNTER — Inpatient Hospital Stay (HOSPITAL_COMMUNITY): Payer: Self-pay

## 2016-05-13 DIAGNOSIS — I2699 Other pulmonary embolism without acute cor pulmonale: Principal | ICD-10-CM

## 2016-05-13 DIAGNOSIS — J9612 Chronic respiratory failure with hypercapnia: Secondary | ICD-10-CM

## 2016-05-13 DIAGNOSIS — J9611 Chronic respiratory failure with hypoxia: Secondary | ICD-10-CM

## 2016-05-13 DIAGNOSIS — R0902 Hypoxemia: Secondary | ICD-10-CM

## 2016-05-13 LAB — ECHOCARDIOGRAM COMPLETE
Height: 72 in
WEIGHTICAEL: 5255.77 [oz_av]

## 2016-05-13 LAB — CBC
HEMATOCRIT: 48.3 % (ref 39.0–52.0)
HEMOGLOBIN: 15.5 g/dL (ref 13.0–17.0)
MCH: 31.7 pg (ref 26.0–34.0)
MCHC: 32.1 g/dL (ref 30.0–36.0)
MCV: 98.8 fL (ref 78.0–100.0)
Platelets: 138 10*3/uL — ABNORMAL LOW (ref 150–400)
RBC: 4.89 MIL/uL (ref 4.22–5.81)
RDW: 14.4 % (ref 11.5–15.5)
WBC: 11.9 10*3/uL — ABNORMAL HIGH (ref 4.0–10.5)

## 2016-05-13 LAB — TROPONIN I: TROPONIN I: 0.03 ng/mL — AB (ref <0.03)

## 2016-05-13 LAB — URINALYSIS, ROUTINE W REFLEX MICROSCOPIC
BILIRUBIN URINE: NEGATIVE
Glucose, UA: 50 mg/dL — AB
Hgb urine dipstick: NEGATIVE
KETONES UR: 5 mg/dL — AB
LEUKOCYTES UA: NEGATIVE
Nitrite: NEGATIVE
PROTEIN: 100 mg/dL — AB
Specific Gravity, Urine: >1.046 — ABNORMAL HIGH (ref 1.005–1.030)
pH: 5 (ref 5.0–8.0)

## 2016-05-13 LAB — MRSA PCR SCREENING: MRSA by PCR: NEGATIVE

## 2016-05-13 LAB — HEPARIN LEVEL (UNFRACTIONATED)
HEPARIN UNFRACTIONATED: 0.36 [IU]/mL (ref 0.30–0.70)
Heparin Unfractionated: <0.1 IU/mL — ABNORMAL LOW (ref 0.30–0.70)
Heparin Unfractionated: <0.1 IU/mL — ABNORMAL LOW (ref 0.30–0.70)

## 2016-05-13 MED ORDER — HEPARIN BOLUS VIA INFUSION
4000.0000 [IU] | Freq: Once | INTRAVENOUS | Status: AC
  Administered 2016-05-13: 4000 [IU] via INTRAVENOUS
  Filled 2016-05-13: qty 4000

## 2016-05-13 MED ORDER — ORAL CARE MOUTH RINSE
15.0000 mL | Freq: Two times a day (BID) | OROMUCOSAL | Status: DC
  Administered 2016-05-13 – 2016-05-16 (×7): 15 mL via OROMUCOSAL

## 2016-05-13 MED ORDER — FUROSEMIDE 10 MG/ML IJ SOLN
40.0000 mg | Freq: Two times a day (BID) | INTRAMUSCULAR | Status: DC
  Administered 2016-05-13 – 2016-05-14 (×3): 40 mg via INTRAVENOUS
  Filled 2016-05-13 (×3): qty 4

## 2016-05-13 NOTE — Progress Notes (Signed)
ANTICOAGULATION CONSULT NOTE - Follow Up Consult  Pharmacy Consult for heparin Indication: pulmonary embolus and DVT  Allergies  Allergen Reactions  . Codeine Nausea Only  . Duricef [Cefadroxil] Itching    Patient Measurements: Height: 6' (182.9 cm) Weight: (!) 328 lb 7.8 oz (149 kg) IBW/kg (Calculated) : 77.6 Heparin Dosing Weight: 112 kg  Vital Signs: Temp: 98 F (36.7 C) (03/14 1919) Temp Source: Oral (03/14 1919) BP: 129/60 (03/14 1919) Pulse Rate: 69 (03/14 1919)  Labs:  Recent Labs  05/12/16 1545 05/12/16 2347 05/13/16 0333 05/13/16 1235 05/13/16 2153  HGB 16.8  --  15.5  --   --   HCT 52.1*  --  48.3  --   --   PLT 154  --  138*  --   --   HEPARINUNFRC  --   --  <0.10* <0.10* 0.36  CREATININE 1.09  --   --   --   --   TROPONINI 0.03* 0.03* <0.03  --   --     Estimated Creatinine Clearance: 117.7 mL/min (by C-G formula based on SCr of 1.09 mg/dL).   Medications:  Infusions:  . heparin 2,500 Units/hr (05/13/16 1900)    Assessment: Heparin for new onset DVT/PE, heparin level now at goal after rate increase earlier today. No issues per RN.  Hgb stable and plts low at 138.  Goal of Therapy:  Heparin level 0.3-0.7 units/ml Monitor platelets by anticoagulation protocol: Yes   Plan:  1. Continue IV heparin at current rate. 2. Daily heparin level and CBC.  Tad MooreJessica Jove Beyl, Pharm D, BCPS  Clinical Pharmacist Pager [phone]  05/13/2016 10:47 PM

## 2016-05-13 NOTE — Progress Notes (Signed)
Name: Kirk Lucas MRN: [id number] DOB: [birth year]    ADMISSION DATE:  05/12/2016 CONSULTATION DATE:  05/12/16  REFERRING MD :  Ranae Palms - EDP  CHIEF COMPLAINT:  RLE edema   HISTORY OF PRESENT ILLNESS:  Kirk Lucas is a 56 y.o. male with a PMH as outlined below including O2 dependent COPD and former smoking hx (followed by Dr. Sherene Sires).  He presented to [facility] on 05/12/16 with right leg swelling that had worsened over the past 1 week and had started to become painful. He had mild redness in the popliteal fossa area. Has also had mild associated shortness of breath which had worsened over the past week. Denied any chest pain, cough, fevers/chills/sweats.  He had lower extremity duplex which revealed DVT in the right profunda femoral, superficial femoral and popliteal veins as well as small focus with an great saphenous vein. Also had CT of the chest which demonstrated acute PE, right greater than left (RV/LV ratio of 1).  He denies any hx of prior VTE, recent long trips, hemoptysis, hx malignancy.  Has had fairly sedentary lifestyle over past few weeks.  He was subsequently started on heparin and was transferred to [facility] for further evaluation and management. PCCM was asked to see in consultation.    SUBJECTIVE:  Looks good, O2 needs decreased   VITAL SIGNS: Temp:  [97.9 F (36.6 C)-98.4 F (36.9 C)] 98.4 F (36.9 C) (03/14 0741) Pulse Rate:  [55-89] 65 (03/14 1000) Resp:  [11-23] 18 (03/14 1000) BP: (81-157)/(57-100) 130/86 (03/14 0741) SpO2:  [80 %-100 %] 91 % (03/14 1000) Weight:  [149 kg (328 lb 7.8 oz)-149.2 kg (329 lb)] 149 kg (328 lb 7.8 oz) (03/13 2255)  PHYSICAL EXAMINATION: General: Obese male, in NAD. Neuro: A&O x 3, no focal deficits. HEENT: [place] / AT. PERRL. Cardiovascular: RRR, no M/R/G. Lungs: Normal resp effort, clear bilaterally. Decreased in bases Abdomen: Obese, BS hypoactive.  S/NT/ND. Musculoskeletal: BLE edema, R > L, venous stasis changes. RT calf tender to  touch Skin: Warm, dry.   Recent Labs Lab 05/12/16 1545  NA 137  K 3.9  CL 99*  CO2 28  BUN 16  CREATININE 1.09  GLUCOSE 118*    Recent Labs Lab 05/12/16 1545 05/13/16 0333  HGB 16.8 15.5  HCT 52.1* 48.3  WBC 13.9* 11.9*  PLT 154 138*   Ct Angio Chest Pe W And/or Wo Contrast  Result Date: 05/12/2016 CLINICAL DATA:  Shortness of Breath and known right lower extremity deep venous thrombosis. EXAM: CT ANGIOGRAPHY CHEST WITH CONTRAST TECHNIQUE: Multidetector CT imaging of the chest was performed using the standard protocol during bolus administration of intravenous contrast. Multiplanar CT image reconstructions and MIPs were obtained to evaluate the vascular anatomy. CONTRAST:  100 mL Isovue 370 COMPARISON:  08/14/2015 FINDINGS: Cardiovascular: Thoracic aorta demonstrates atherosclerotic calcifications without aneurysmal dilatation or dissection. The pulmonary artery is well visualized with bilateral pulmonary filling defects consistent with emboli. These are much greater on the right than the left. The RV/LV ratio is 1 consistent with mild heart strain. Mediastinum/Nodes: The thoracic inlet is within normal limits. No significant hilar or mediastinal adenopathy is noted. Scattered small mediastinal lymph nodes are noted stable from the previous exam. Lungs/Pleura: 5 mm nodule is again noted in the posterior aspect of the right upper lobe best seen on image number 45 of series 6. Mild left basilar atelectasis is noted. No sizable effusion or pneumothorax is noted. Upper Abdomen: Within normal limits. Musculoskeletal: Mild degenerative change of the thoracic  spine is noted. No acute bony abnormality is seen. Review of the MIP images confirms the above findings. IMPRESSION: Positive for acute PE right greater than left with CT evidence of right heart strain (RV/LV Ratio = 1) consistent with at least submassive (intermediate risk) PE. The presence of right heart strain has been associated with an  increased risk of morbidity and mortality. Please activate Code PE by paging [phone]. Stable 5 mm nodule in the right upper lobe. New left basilar atelectasis. Critical Value/emergent results were called by telephone at the time of interpretation on 05/12/2016 at 7:34 pm to Dr. Loren RacerAVID YELVERTON , who verbally acknowledged these results. Electronically Signed   By: Alcide CleverMark  Lukens M.D.   On: 05/12/2016 19:37   [place] Venous Img Lower Unilateral Right  Result Date: 05/12/2016 CLINICAL DATA:  Sudden onset right calf pain for several days, initial encounter EXAM: RIGHT LOWER EXTREMITY VENOUS DOPPLER ULTRASOUND TECHNIQUE: Gray-scale sonography with graded compression, as well as color Doppler and duplex ultrasound were performed to evaluate the lower extremity deep venous systems from the level of the common femoral vein and including the common femoral, femoral, profunda femoral, popliteal and calf veins including the posterior tibial, peroneal and gastrocnemius veins when visible. The superficial great saphenous vein was also interrogated. Spectral Doppler was utilized to evaluate flow at rest and with distal augmentation maneuvers in the common femoral, femoral and popliteal veins. COMPARISON:  None. FINDINGS: Contralateral Common Femoral Vein: Respiratory phasicity is normal and symmetric with the symptomatic side. No evidence of thrombus. Normal compressibility. Common Femoral Vein: No evidence of thrombus. Normal compressibility, respiratory phasicity and response to augmentation. Saphenofemoral Junction: No evidence of thrombus. Normal compressibility and flow on color Doppler imaging. Profunda Femoral Vein: Echogenic thrombus is noted within with decreased compressibility. Femoral Vein: Echogenic thrombus is noted within with decreased compressibility. Popliteal Vein: Echogenic thrombus is noted within with decreased compressibility. Calf Veins: No evidence of thrombus. Normal compressibility and flow on color  Doppler imaging. Superficial Great Saphenous Vein: Mild thrombus is noted within the greater saphenous vein just below the saphenous femoral junction. Venous Reflux:  None. Other Findings:  None. IMPRESSION: Deep venous thrombosis within the right profunda femoral, superficial femoral and popliteal veins as well as a small focus within the greater saphenous vein. Electronically Signed   By: Alcide CleverMark  Lukens M.D.   On: 05/12/2016 18:32   Dg Chest Port 1 View  Result Date: 05/12/2016 CLINICAL DATA:  RIGHT leg swelling for 2 weeks.  Pain for 6 days EXAM: PORTABLE CHEST 1 VIEW COMPARISON:  CT 08/14/2015 FINDINGS: Normal mediastinum and cardiac silhouette. Chronic central bronchitic markings. Normal pulmonary vasculature. No effusion, infiltrate, or pneumothorax. IMPRESSION: Chronic bronchitic markings.  No acute findings Electronically Signed   By: Genevive BiStewart  Edmunds M.D.   On: 05/12/2016 16:22    STUDIES:  LE Duplex 3/13 > DVT in the right profunda femoral, superficial femoral and popliteal veins as well as small focus with an great saphenous vein.  CTA chest 3/13 > acute PE, right greater than left (RV/LV ratio of 1).  Stable 5mm nodule in RUL, new left basilar atelectasis.  SIGNIFICANT EVENTS  3/13 > admit.  ASSESSMENT / PLAN:  Acute PE, R > L - appears unprovoked; only risk factor is obesity. RLE DVT. Acute on chronic hypoxic respiratory failure - due to COPD + new acute PE (of note is supposed to be on home O2 but has not as he has not been able to afford this). COPD without exacerbation.  Atelectasis. Former smoker. Plan: Continue heparin drip. Transition to oral agent, warfarin probably more affordable though cost of copay visits with f/u checks may be prohibitory.  May need assistance from social work regarding possible options with NOAC's. Trend troponins. Assess echo. Defer hypercoagulable workup given that he has started heparin. Continue supplemental O2 as needed to maintain SpO2 >  92%. Continue BD's. Incentive spirometry. Mobilize as able. Will need home O2   Rest of issues per primary team. PCCM available PRN   Brett Canales Inis Borneman ACNP Adolph Pollack PCCM Pager [phone] till 3 pm If no answer page [phone] 05/13/2016, 11:16 AM

## 2016-05-13 NOTE — Progress Notes (Signed)
ANTICOAGULATION CONSULT NOTE - Follow Up Consult  Pharmacy Consult for Heparin  Indication: pulmonary embolus and DVT  Allergies  Allergen Reactions  . Codeine Nausea Only  . Duricef [Cefadroxil] Itching   Patient Measurements: Height: 6' (182.9 cm) Weight: (!) 328 lb 7.8 oz (149 kg) IBW/kg (Calculated) : 77.6  Vital Signs: Temp: 98.3 F (36.8 C) (03/14 0240) Temp Source: Oral (03/14 0240) BP: 142/72 (03/14 0240) Pulse Rate: 65 (03/14 0240)  Labs:  Recent Labs  05/12/16 1545 05/12/16 2347 05/13/16 0333  HGB 16.8  --  15.5  HCT 52.1*  --  48.3  PLT 154  --  138*  HEPARINUNFRC  --   --  <0.10*  CREATININE 1.09  --   --   TROPONINI 0.03* 0.03* <0.03    Estimated Creatinine Clearance: 117.7 mL/min (by C-G formula based on SCr of 1.09 mg/dL).   Assessment: Heparin for new onset DVT/PE, heparin level is undetectable this AM, no issues per RN.  Goal of Therapy:  Heparin level 0.3-0.7 units/ml Monitor platelets by anticoagulation protocol: Yes   Plan:  Heparin 4000 units re-bolus Inc heparin drip to 2100 units/hr 1300 HL  Mehar Kirkwood 05/13/2016,5:51 AM

## 2016-05-13 NOTE — Care Management Note (Addendum)
Case Management Note  Patient Details  Name: Kirk ReedyRobert Gowdy MRN: [id number] Date of Birth: Oct 03, 1960  Subjective/Objective:    Patient is from home with sister, he has no PCP, no insurance, he is willing to go to [facility] clinic, apt scheduled for him on 3/21 at 2:30.  He will need ast with pass [facility].  NCM will give him the 30 day free savings card- [facility] [address] [place] to get this and they have in stock, then he will need to go thru there patient [facility].  If he runs out of the 30 day free before he heres back from patient assitance they will give him a another 30 day for $10.00 per rep [facility]. Also for his other medications, if he is discharged during the week , [facility] but if dc over the weekend will need ast with [employer].   Per RN , he may need home oxygen at dc , NCM will cont to follow for dc needs.                Action/Plan:   Expected Discharge Date:                  Expected Discharge Plan:  Home/Self Care  In-House Referral:     Discharge planning Services  CM Consult, Follow-up appt scheduled, Indigent Health Clinic, Medication Assistance  Post Acute Care Choice:    Choice offered to:     DME Arranged:    DME Agency:     HH Arranged:    HH Agency:     Status of Service:  In process, will continue to follow  If discussed at Long Length of Stay Meetings, dates discussed:    Additional Comments: 05/14/16 1018am Should pt need home oxygen at discharge, [facility]  Pt will need to be evaluated for home oxygen by checking room air sats at rest/with ambulation, and documentation needs to be provided.  Will refer to financial counseling for possible Medicaid appliction.   Sidney AceJulie Kaylin Schellenberg, RN,BSN Case Manager  Leone Havenaylor, Deborah Clinton, RN 05/13/2016, 2:50 PM

## 2016-05-13 NOTE — Progress Notes (Addendum)
PROGRESS NOTE  Kirk ReedyRobert Lucas  ZOX:[id number] DOB: [birth year] DOA: 05/12/2016 PCP: No primary care provider on file. Outpatient Specialists:  Subjective: Continues to be short of breath, on 6 L of oxygen to keep oxygen saturation above 90%. Continued on heparin drip  Brief Narrative:  Kirk ReedyRobert Lucas is a 56 y.o. 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  The patient was in his usual state of health until a few weeks ago when he started to have swelling in his right leg) he has chronic left leg swelling, but not usually right leg). Then about one week ago, he started to have constant aching pain in his right leg, moderate in intensity, and the calf and behind the knee, in addition to redness and worsening swelling. Around that time he also started to notice shortness of breath with even the slightest exertion, without chest pain, orthopnea, PND, wheezing, sputum, or cough. He had no hemoptysis, no recent surgery, no recent immobility. He had no previous PE. He has had no weight loss, no known cancer.  Assessment & Plan:   Principal Problem:   Bilateral pulmonary embolism (HCC) Active Problems:   Chronic diastolic CHF (congestive heart failure) (HCC)   Morbid (severe) obesity due to excess calories (HCC)   Chronic respiratory failure with hypoxia and hypercapnia (HCC)   PE and DVT:  -Bilateral PE, submassive with evidence of right heart strain. -2-D echo ordered, still pending. -Troponin is negative at 0.03 -PCCM , consulted, no indication for thrombolysis, hemodynamically stable. -Continue heparin drip, case manager consulted for oral anticoagulants for outpatient use.  Acute respiratory failure with hypoxia -On 6 L of oxygen through high flow nasal cannula this morning, will wean to room air. -Per PCCM, he will likely need oxygen on  discharge. -Has history of possible obesity hypoventilation syndrome, follows with Dr. Sherene SiresWert as outpatient.  COPD:  Inactive. -Continue home inhaler Utibron  Chronic diastolic CHF and cor pulmonale -Although his BNP is 112 he has lower extremity edema, increase in his Lasix to 40 mg twice a day IV. -On metoprolol and furosemide continued.  HTN -Continue current medications.   DVT prophylaxis: On heparin drip. Code Status: Full Code Family Communication:  Disposition Plan:  Diet: Diet 2 gram sodium Room service appropriate? Yes; Fluid consistency: Thin  Consultants:   PCCM  Procedures:   None  Antimicrobials:   None   Objective: Vitals:   05/13/16 0140 05/13/16 0240 05/13/16 0741 05/13/16 1000  BP:  (!) 142/72 130/86   Pulse: 69 65 61 65  Resp: 14 17 11 18   Temp:  98.3 F (36.8 C) 98.4 F (36.9 C)   TempSrc:  Oral Oral   SpO2: 93% 93% 93% 91%  Weight:      Height:        Intake/Output Summary (Last 24 hours) at 05/13/16 1048 Last data filed at 05/13/16 1000  Gross per 24 hour  Intake          1020.42 ml  Output             1125 ml  Net          -104.58 ml   Filed Weights   05/12/16 1524 05/12/16 2255  Weight: (!) 149.2 kg (329 lb) (!) 149 kg (328 lb 7.8 oz)    Examination: General exam: Appears calm and comfortable  Respiratory system: Clear  to auscultation. Respiratory effort normal. Cardiovascular system: S1 & S2 heard, RRR. No JVD, murmurs, rubs, gallops or clicks. No pedal edema. Gastrointestinal system: Abdomen is nondistended, soft and nontender. No organomegaly or masses felt. Normal bowel sounds heard. Central nervous system: Alert and oriented. No focal neurological deficits. Extremities: Symmetric 5 x 5 power. Skin: No rashes, lesions or ulcers Psychiatry: Judgement and insight appear normal. Mood & affect appropriate.   Data Reviewed: I have personally reviewed following labs and imaging studies  CBC:  Recent Labs Lab  05/12/16 1545 05/13/16 0333  WBC 13.9* 11.9*  HGB 16.8 15.5  HCT 52.1* 48.3  MCV 98.5 98.8  PLT 154 138*   Basic Metabolic Panel:  Recent Labs Lab 05/12/16 1545  NA 137  K 3.9  CL 99*  CO2 28  GLUCOSE 118*  BUN 16  CREATININE 1.09  CALCIUM 8.9   GFR: Estimated Creatinine Clearance: 117.7 mL/min (by C-G formula based on SCr of 1.09 mg/dL). Liver Function Tests: No results for input(s): AST, ALT, ALKPHOS, BILITOT, PROT, ALBUMIN in the last 168 hours. No results for input(s): LIPASE, AMYLASE in the last 168 hours. No results for input(s): AMMONIA in the last 168 hours. Coagulation Profile: No results for input(s): INR, PROTIME in the last 168 hours. Cardiac Enzymes:  Recent Labs Lab 05/12/16 1545 05/12/16 2347 05/13/16 0333  TROPONINI 0.03* 0.03* <0.03   BNP (last 3 results) No results for input(s): PROBNP in the last 8760 hours. HbA1C: No results for input(s): HGBA1C in the last 72 hours. CBG: No results for input(s): GLUCAP in the last 168 hours. Lipid Profile: No results for input(s): CHOL, HDL, LDLCALC, TRIG, CHOLHDL, LDLDIRECT in the last 72 hours. Thyroid Function Tests: No results for input(s): TSH, T4TOTAL, FREET4, T3FREE, THYROIDAB in the last 72 hours. Anemia Panel: No results for input(s): VITAMINB12, FOLATE, FERRITIN, TIBC, IRON, RETICCTPCT in the last 72 hours. Urine analysis:    Component Value Date/Time   COLORURINE YELLOW 05/12/2016 2330   APPEARANCEUR CLEAR 05/12/2016 2330   LABSPEC >1.046 (H) 05/12/2016 2330   PHURINE 5.0 05/12/2016 2330   GLUCOSEU 50 (A) 05/12/2016 2330   HGBUR NEGATIVE 05/12/2016 2330   BILIRUBINUR NEGATIVE 05/12/2016 2330   KETONESUR 5 (A) 05/12/2016 2330   PROTEINUR 100 (A) 05/12/2016 2330   NITRITE NEGATIVE 05/12/2016 2330   LEUKOCYTESUR NEGATIVE 05/12/2016 2330   Sepsis Labs: @LABRCNTIP (procalcitonin:4,lacticidven:4)  ) Recent Results (from the past 240 hour(s))  MRSA PCR Screening     Status: None    Collection Time: 05/13/16 12:52 AM  Result Value Ref Range Status   MRSA by PCR NEGATIVE NEGATIVE Final    Comment:        The GeneXpert MRSA Assay (FDA approved for NASAL specimens only), is one component of a comprehensive MRSA colonization surveillance program. It is not intended to diagnose MRSA infection nor to guide or monitor treatment for MRSA infections.      Invalid input(s): PROCALCITONIN, LACTICACIDVEN   Radiology Studies: Ct Angio Chest Pe W And/or Wo Contrast  Result Date: 05/12/2016 CLINICAL DATA:  Shortness of Breath and known right lower extremity deep venous thrombosis. EXAM: CT ANGIOGRAPHY CHEST WITH CONTRAST TECHNIQUE: Multidetector CT imaging of the chest was performed using the standard protocol during bolus administration of intravenous contrast. Multiplanar CT image reconstructions and MIPs were obtained to evaluate the vascular anatomy. CONTRAST:  100 mL Isovue 370 COMPARISON:  08/14/2015 FINDINGS: Cardiovascular: Thoracic aorta demonstrates atherosclerotic calcifications without aneurysmal dilatation or dissection. The pulmonary artery is well  visualized with bilateral pulmonary filling defects consistent with emboli. These are much greater on the right than the left. The RV/LV ratio is 1 consistent with mild heart strain. Mediastinum/Nodes: The thoracic inlet is within normal limits. No significant hilar or mediastinal adenopathy is noted. Scattered small mediastinal lymph nodes are noted stable from the previous exam. Lungs/Pleura: 5 mm nodule is again noted in the posterior aspect of the right upper lobe best seen on image number 45 of series 6. Mild left basilar atelectasis is noted. No sizable effusion or pneumothorax is noted. Upper Abdomen: Within normal limits. Musculoskeletal: Mild degenerative change of the thoracic spine is noted. No acute bony abnormality is seen. Review of the MIP images confirms the above findings. IMPRESSION: Positive for acute PE right  greater than left with CT evidence of right heart strain (RV/LV Ratio = 1) consistent with at least submassive (intermediate risk) PE. The presence of right heart strain has been associated with an increased risk of morbidity and mortality. Please activate Code PE by paging [phone]. Stable 5 mm nodule in the right upper lobe. New left basilar atelectasis. Critical Value/emergent results were called by telephone at the time of interpretation on 05/12/2016 at 7:34 pm to Dr. Loren Racer , who verbally acknowledged these results. Electronically Signed   By: Alcide Clever M.D.   On: 05/12/2016 19:37   US Venous Img Lower Unilateral Right  Result Date: 05/12/2016 CLINICAL DATA:  Sudden onset right calf pain for several days, initial encounter EXAM: RIGHT LOWER EXTREMITY VENOUS DOPPLER ULTRASOUND TECHNIQUE: Gray-scale sonography with graded compression, as well as color Doppler and duplex ultrasound were performed to evaluate the lower extremity deep venous systems from the level of the common femoral vein and including the common femoral, femoral, profunda femoral, popliteal and calf veins including the posterior tibial, peroneal and gastrocnemius veins when visible. The superficial great saphenous vein was also interrogated. Spectral Doppler was utilized to evaluate flow at rest and with distal augmentation maneuvers in the common femoral, femoral and popliteal veins. COMPARISON:  None. FINDINGS: Contralateral Common Femoral Vein: Respiratory phasicity is normal and symmetric with the symptomatic side. No evidence of thrombus. Normal compressibility. Common Femoral Vein: No evidence of thrombus. Normal compressibility, respiratory phasicity and response to augmentation. Saphenofemoral Junction: No evidence of thrombus. Normal compressibility and flow on color Doppler imaging. Profunda Femoral Vein: Echogenic thrombus is noted within with decreased compressibility. Femoral Vein: Echogenic thrombus is noted  within with decreased compressibility. Popliteal Vein: Echogenic thrombus is noted within with decreased compressibility. Calf Veins: No evidence of thrombus. Normal compressibility and flow on color Doppler imaging. Superficial Great Saphenous Vein: Mild thrombus is noted within the greater saphenous vein just below the saphenous femoral junction. Venous Reflux:  None. Other Findings:  None. IMPRESSION: Deep venous thrombosis within the right profunda femoral, superficial femoral and popliteal veins as well as a small focus within the greater saphenous vein. Electronically Signed   By: Alcide Clever M.D.   On: 05/12/2016 18:32   Dg Chest Port 1 View  Result Date: 05/12/2016 CLINICAL DATA:  RIGHT leg swelling for 2 weeks.  Pain for 6 days EXAM: PORTABLE CHEST 1 VIEW COMPARISON:  CT 08/14/2015 FINDINGS: Normal mediastinum and cardiac silhouette. Chronic central bronchitic markings. Normal pulmonary vasculature. No effusion, infiltrate, or pneumothorax. IMPRESSION: Chronic bronchitic markings.  No acute findings Electronically Signed   By: Genevive Bi M.D.   On: 05/12/2016 16:22        Scheduled Meds: . aspirin EC  81 mg Oral Daily  . furosemide  40 mg Oral BID  . Indacaterol-Glycopyrrolate  1 capsule Inhalation BID  . mouth rinse  15 mL Mouth Rinse BID  . metoprolol tartrate  25 mg Oral BID  . sodium chloride flush  3 mL Intravenous Q12H   Continuous Infusions: . heparin 2,100 Units/hr (05/13/16 0700)     LOS: 1 day    Time spent: 35 minutes    Toshiro Hanken A, MD [facility] Pager [phone]  If 7PM-7AM, please contact night-coverage [email or web address] Password [facility] 05/13/2016, 10:48 AM

## 2016-05-13 NOTE — Progress Notes (Signed)
  Echocardiogram 2D Echocardiogram has been performed.  Janalyn HarderWest, Felise Georgia R 05/13/2016, 12:42 PM

## 2016-05-13 NOTE — H&P (Signed)
History and Physical  Patient Name: Kirk Lucas     WUJ:[id number]    DOB: [birth year]    DOA: 05/12/2016 PCP: No primary care provider on file.   Patient coming from: Home --> [facility]  Chief Complaint: Leg pain and SOB  HPI: Kirk Lucas is a 56 y.o. 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  The patient was in his usual state of health until a few weeks ago when he started to have swelling in his right leg) he has chronic left leg swelling, but not usually right leg). Then about one week ago, he started to have constant aching pain in his right leg, moderate in intensity, and the calf and behind the knee, in addition to redness and worsening swelling. Around that time he also started to notice shortness of breath with even the slightest exertion, without chest pain, orthopnea, PND, wheezing, sputum, or cough. He had no hemoptysis, no recent surgery, no recent immobility. He had no previous PE. He has had no weight loss, no known cancer.  ED course: -Afebrile, heart rate 71, respirations 22, blood pressure 124/90, pulse oximetry 80% initially on room air -Na 137, K 3.9, Cr 1.09, WBC 13.9K, Hgb 16.8 -Chest x-ray showed chronic bronchitis -Doppler of the right leg showed DVT in the profunda, SFA, and popliteal veins -BNP 111, troponin 0.03 -CT angiogram of the chest showed right greater than left bilateral PE with RV/TLC ratio 1 and he was started on heparin drip -The case was discussed with CCM, who recommended hospital admission TRH rest except in transfer     ROS: Review of Systems  Constitutional: Negative for chills, fever, malaise/fatigue and weight loss.  Respiratory: Positive for shortness of breath. Negative for cough, hemoptysis, sputum production and wheezing.   Cardiovascular: Positive for leg swelling. Negative for chest pain.    Neurological: Negative for dizziness, loss of consciousness and weakness.  All other systems reviewed and are negative.         Past Medical History:  Diagnosis Date  . CHF (congestive heart failure) (HCC) June 2017   normal LVF by echo  . COPD (chronic obstructive pulmonary disease) (HCC)   . Hypertension   . Morbid obesity (HCC)    BMI 46  . Sleep apnea    by history  . Smoker     Past Surgical History:  Procedure Laterality Date  . arm surgery    . WRIST SURGERY      Social History: Patient lives with his sister.  The patient walks unassisted.  He can walk for >15 minutes without dyspnea, if he walks slowly.  He is a former smoker.  From [place], lived in [place] since age 19.  Used to work in [employer] pl[employer], later in Editor, commissioning, now unemployed.  Allergies  Allergen Reactions  . Codeine Nausea Only  . Duricef [Cefadroxil] Itching    Family history: family history includes Congestive Heart Failure (age of onset: 72) in his father; Coronary artery disease (age of onset: 87) in his father and mother; Diabetes type II in his sister; Pulmonary disease in his father; Stroke (age of onset: 56) in his mother.  Prior to Admission medications   Medication Sig Start Date End Date Taking? Authorizing Provider  aspirin EC 81 MG EC tablet Take 1 tablet (81 mg total) by mouth daily. 08/20/15   Alison Murray, MD  furosemide (LASIX) 40 MG tablet Take 1 tablet (40 mg total) by mouth 2 (two) times daily. 11/21/15   Vesta MixerPhilip J Nahser, MD  Indacaterol-Glycopyrrolate (UTIBRON NEOHALER) 27.5-15.6 MCG CAPS Place 1 capsule into inhaler and inhale 2 (two) times daily. 05/07/16   Nyoka CowdenMichael B Wert, MD  metoprolol tartrate (LOPRESSOR) 25 MG tablet Take 1 tablet (25 mg total) by mouth 2 (two) times daily. 09/21/15   Brittainy Sherlynn CarbonM Simmons, PA-C       Physical Exam: BP (!) 157/100 (BP Location: Right Wrist)   Pulse 89   Temp 98.1 F (36.7 C) (Oral)   Resp 18   Ht 6' (1.829 m)   Wt (!) 149 kg (328 lb 7.8 oz)   SpO2  92%   BMI 44.55 kg/m  General appearance: Well-developed, obese adult male, alert and in no acute distress.   Eyes: Anicteric, conjunctiva pink, lids and lashes normal. PERRL.    ENT: No nasal deformity, discharge, epistaxis.  Hearing normal. OP moist without lesions.   Neck: No neck masses.  Trachea midline.  No thyromegaly/tenderness. Lymph: No cervical or supraclavicular lymphadenopathy. Skin: Warm and dry.  Very fair skinned.  Chronic brawny change to left lower leg.  RIght leg red and swollen compared to left.  No jaundice.  No suspicious rashes or lesions. Cardiac: RRR, nl S1-S2, no murmurs appreciated.  Capillary refill is brisk.  JVP not visible.  Bilateral 2+ LE edema.  Radial pulses 2+ and symmetric. Respiratory: Normal respiratory rate and rhythm.  CTAB without rales or wheezes. Abdomen: Abdomen soft.  No TTP. No ascites, distension, hepatosplenomegaly.   MSK: No deformities or effusions.  No cyanosis or clubbing. Neuro: Cranial nerves normal.  Sensation intact to light touch. Speech is fluent.  Muscle strength normal.    Psych: Sensorium intact and responding to questions, attention normal.  Behavior appropriate.  Affect normal.  Judgment and insight appear normal.     Labs on Admission:  I have personally reviewed following labs and imaging studies: CBC:  Recent Labs Lab 05/12/16 1545  WBC 13.9*  HGB 16.8  HCT 52.1*  MCV 98.5  PLT 154   Basic Metabolic Panel:  Recent Labs Lab 05/12/16 1545  NA 137  K 3.9  CL 99*  CO2 28  GLUCOSE 118*  BUN 16  CREATININE 1.09  CALCIUM 8.9   GFR: Estimated Creatinine Clearance: 117.7 mL/min (by C-G formula based on SCr of 1.09 mg/dL).  Liver Function Tests: No results for input(s): AST, ALT, ALKPHOS, BILITOT, PROT, ALBUMIN in the last 168 hours. No results for input(s): LIPASE, AMYLASE in the last 168 hours. No results for input(s): AMMONIA in the last 168 hours. Coagulation Profile: No results for input(s): INR,  PROTIME in the last 168 hours. Cardiac Enzymes:  Recent Labs Lab 05/12/16 1545  TROPONINI 0.03*   BNP (last 3 results) No results for input(s): PROBNP in the last 8760 hours. HbA1C: No results for input(s): HGBA1C in the last 72 hours. CBG: No results for input(s): GLUCAP in the last 168 hours. Lipid Profile: No results for input(s): CHOL, HDL, LDLCALC, TRIG, CHOLHDL, LDLDIRECT in the last 72 hours. Thyroid Function Tests: No results for input(s): TSH, T4TOTAL, FREET4, T3FREE, THYROIDAB in the last 72 hours. Anemia Panel: No results for input(s): VITAMINB12, FOLATE, FERRITIN, TIBC, IRON, RETICCTPCT in the last 72 hours. Sepsis Labs: Invalid input(s): PROCALCITONIN, LACTICIDVEN No results found for this or any previous visit (from the past 240 hour(s)).       Radiological Exams on  Admission: Personally reviewed CXR shows chronic interstitial coarsening, no focal opacity.  CTA report and [place] reports reviewed: Ct Angio Chest Pe W And/or Wo Contrast  Result Date: 05/12/2016 CLINICAL DATA:  Shortness of Breath and known right lower extremity deep venous thrombosis. EXAM: CT ANGIOGRAPHY CHEST WITH CONTRAST TECHNIQUE: Multidetector CT imaging of the chest was performed using the standard protocol during bolus administration of intravenous contrast. Multiplanar CT image reconstructions and MIPs were obtained to evaluate the vascular anatomy. CONTRAST:  100 mL Isovue 370 COMPARISON:  08/14/2015 FINDINGS: Cardiovascular: Thoracic aorta demonstrates atherosclerotic calcifications without aneurysmal dilatation or dissection. The pulmonary artery is well visualized with bilateral pulmonary filling defects consistent with emboli. These are much greater on the right than the left. The RV/LV ratio is 1 consistent with mild heart strain. Mediastinum/Nodes: The thoracic inlet is within normal limits. No significant hilar or mediastinal adenopathy is noted. Scattered small mediastinal lymph nodes are noted  stable from the previous exam. Lungs/Pleura: 5 mm nodule is again noted in the posterior aspect of the right upper lobe best seen on image number 45 of series 6. Mild left basilar atelectasis is noted. No sizable effusion or pneumothorax is noted. Upper Abdomen: Within normal limits. Musculoskeletal: Mild degenerative change of the thoracic spine is noted. No acute bony abnormality is seen. Review of the MIP images confirms the above findings. IMPRESSION: Positive for acute PE right greater than left with CT evidence of right heart strain (RV/LV Ratio = 1) consistent with at least submassive (intermediate risk) PE. The presence of right heart strain has been associated with an increased risk of morbidity and mortality. Please activate Code PE by paging [phone]. Stable 5 mm nodule in the right upper lobe. New left basilar atelectasis. Critical Value/emergent results were called by telephone at the time of interpretation on 05/12/2016 at 7:34 pm to Dr. Loren Racer , who verbally acknowledged these results. Electronically Signed   By: Alcide Clever M.D.   On: 05/12/2016 19:37   US Venous Img Lower Unilateral Right  Result Date: 05/12/2016 CLINICAL DATA:  Sudden onset right calf pain for several days, initial encounter EXAM: RIGHT LOWER EXTREMITY VENOUS DOPPLER ULTRASOUND TECHNIQUE: Gray-scale sonography with graded compression, as well as color Doppler and duplex ultrasound were performed to evaluate the lower extremity deep venous systems from the level of the common femoral vein and including the common femoral, femoral, profunda femoral, popliteal and calf veins including the posterior tibial, peroneal and gastrocnemius veins when visible. The superficial great saphenous vein was also interrogated. Spectral Doppler was utilized to evaluate flow at rest and with distal augmentation maneuvers in the common femoral, femoral and popliteal veins. COMPARISON:  None. FINDINGS: Contralateral Common Femoral Vein:  Respiratory phasicity is normal and symmetric with the symptomatic side. No evidence of thrombus. Normal compressibility. Common Femoral Vein: No evidence of thrombus. Normal compressibility, respiratory phasicity and response to augmentation. Saphenofemoral Junction: No evidence of thrombus. Normal compressibility and flow on color Doppler imaging. Profunda Femoral Vein: Echogenic thrombus is noted within with decreased compressibility. Femoral Vein: Echogenic thrombus is noted within with decreased compressibility. Popliteal Vein: Echogenic thrombus is noted within with decreased compressibility. Calf Veins: No evidence of thrombus. Normal compressibility and flow on color Doppler imaging. Superficial Great Saphenous Vein: Mild thrombus is noted within the greater saphenous vein just below the saphenous femoral junction. Venous Reflux:  None. Other Findings:  None. IMPRESSION: Deep venous thrombosis within the right profunda femoral, superficial femoral and popliteal veins as well as a  small focus within the greater saphenous vein. Electronically Signed   By: Alcide Clever M.D.   On: 05/12/2016 18:32   Dg Chest Port 1 View  Result Date: 05/12/2016 CLINICAL DATA:  RIGHT leg swelling for 2 weeks.  Pain for 6 days EXAM: PORTABLE CHEST 1 VIEW COMPARISON:  CT 08/14/2015 FINDINGS: Normal mediastinum and cardiac silhouette. Chronic central bronchitic markings. Normal pulmonary vasculature. No effusion, infiltrate, or pneumothorax. IMPRESSION: Chronic bronchitic markings.  No acute findings Electronically Signed   By: Genevive Bi M.D.   On: 05/12/2016 16:22    EKG: Independently reviewed. Rate 93, QTc 453, diffuse TW flattening, no change from previous.  Echocardiogram June 2017: EF 55-60% PAP 52     Assessment/Plan  1. PE and DVT:  First unprovoked PE. -Heparin gtt -Monitor in stepdown -Echocardiogram ordered -Trend troponin -Consult to CM re: coverage of NOAC vs warfarin and assistance with  Home O2 if needed --> 6 months anticoagulation, can follow up with Dr. Sherene Sires -PCCM consulted, appreciate assistance   2. COPD:  Inactive. -Continue home inhaler Utibron  3. Hypertension and chronic diastolic CHF:  Appears euvolemic.  Currently hemodynamically stable, BP good. -Continue metoprolol -Continue furosemide           DVT prophylaxis: N/A has DVT  Code Status: FULL  Family Communication: None present  Disposition Plan: Anticipate IV heparin and CM consult to arrange oral anticoagulant and home O2 if needed Consults called: CM, PCCM Admission status: INPATIENT        Medical decision making: Patient seen at 11:15 PM on 05/12/2016.  The patient was discussed with Dr. Vassie Loll.  What exists of the patient's chart was reviewed in depth and summarized above.  Clinical condition: stable at present, BP currently 120s systolic, no episodes of clinically significant hypotension or hemodynamic instability at [facility].        Alberteen Sam [facility] Pager [phone]

## 2016-05-13 NOTE — Progress Notes (Signed)
ANTICOAGULATION CONSULT NOTE - Follow Up Consult  Pharmacy Consult for Heparin  Indication: pulmonary embolus and DVT  Allergies  Allergen Reactions  . Codeine Nausea Only  . Duricef [Cefadroxil] Itching   Patient Measurements: Height: 6' (182.9 cm) Weight: (!) 328 lb 7.8 oz (149 kg) IBW/kg (Calculated) : 77.6  Vital Signs: Temp: 97.8 F (36.6 C) (03/14 1232) Temp Source: Oral (03/14 1232) BP: 143/85 (03/14 1232) Pulse Rate: 58 (03/14 1232)  Assessment: Heparin for new onset DVT/PE, heparin remains undetectable today after rate increase. No issues per RN.  Hgb stable and plts low at 138.  Goal of Therapy:  Heparin level 0.3-0.7 units/ml Monitor platelets by anticoagulation protocol: Yes   Plan:  Give heparin 4,000 unit heparin bolus Increase heparin gtt to 2,500 units/hr Monitor daily heparin level, CBC, s/s of bleed  Enzo BiNathan Tru Leopard, PharmD, BCPS Clinical Pharmacist Pager [phone] 05/13/2016 2:02 PM

## 2016-05-14 LAB — CBC
HCT: 50.5 % (ref 39.0–52.0)
HEMOGLOBIN: 15.6 g/dL (ref 13.0–17.0)
MCH: 31.1 pg (ref 26.0–34.0)
MCHC: 30.9 g/dL (ref 30.0–36.0)
MCV: 100.8 fL — ABNORMAL HIGH (ref 78.0–100.0)
Platelets: 158 10*3/uL (ref 150–400)
RBC: 5.01 MIL/uL (ref 4.22–5.81)
RDW: 14.5 % (ref 11.5–15.5)
WBC: 13.7 10*3/uL — AB (ref 4.0–10.5)

## 2016-05-14 LAB — BASIC METABOLIC PANEL
ANION GAP: 9 (ref 5–15)
BUN: 14 mg/dL (ref 6–20)
CHLORIDE: 98 mmol/L — AB (ref 101–111)
CO2: 31 mmol/L (ref 22–32)
Calcium: 8.8 mg/dL — ABNORMAL LOW (ref 8.9–10.3)
Creatinine, Ser: 0.85 mg/dL (ref 0.61–1.24)
GFR calc non Af Amer: >60 mL/min (ref >60)
Glucose, Bld: 107 mg/dL — ABNORMAL HIGH (ref 65–99)
POTASSIUM: 4.5 mmol/L (ref 3.5–5.1)
SODIUM: 138 mmol/L (ref 135–145)

## 2016-05-14 LAB — HEPARIN LEVEL (UNFRACTIONATED): HEPARIN UNFRACTIONATED: 0.33 [IU]/mL (ref 0.30–0.70)

## 2016-05-14 MED ORDER — APIXABAN 5 MG PO TABS: 5.0000 mg | ORAL_TABLET | Freq: Two times a day (BID) | ORAL | Status: DC

## 2016-05-14 MED ORDER — APIXABAN 5 MG PO TABS
10.0000 mg | ORAL_TABLET | Freq: Two times a day (BID) | ORAL | Status: DC
  Administered 2016-05-14 – 2016-05-17 (×7): 10 mg via ORAL
  Filled 2016-05-14 (×7): qty 2

## 2016-05-14 NOTE — Progress Notes (Signed)
Patient admitted to 2W34, A&Ox4, VSS. Tele applied and CCMD notified. Oriented to room and call light placed within reach.

## 2016-05-14 NOTE — Progress Notes (Signed)
ANTICOAGULATION CONSULT NOTE - Follow Up Consult  Pharmacy Consult for heparin Indication: pulmonary embolus and DVT  Allergies  Allergen Reactions  . Codeine Nausea Only  . Duricef [Cefadroxil] Itching    Patient Measurements: Height: 6' (182.9 cm) Weight: (!) 328 lb 7.8 oz (149 kg) IBW/kg (Calculated) : 77.6 Heparin Dosing Weight: 112 kg  Vital Signs: Temp: 97.9 F (36.6 C) (03/15 0726) Temp Source: Oral (03/15 0726) BP: 123/66 (03/15 0726) Pulse Rate: 56 (03/15 0757)  Labs:  Recent Labs  05/12/16 1545 05/12/16 2347  05/13/16 0333 05/13/16 1235 05/13/16 2153 05/14/16 0235 05/14/16 0529  HGB 16.8  --   --  15.5  --   --  15.6  --   HCT 52.1*  --   --  48.3  --   --  50.5  --   PLT 154  --   --  138*  --   --  158  --   HEPARINUNFRC  --   --   < > <0.10* <0.10* 0.36  --  0.33  CREATININE 1.09  --   --   --   --   --  0.85  --   TROPONINI 0.03* 0.03*  --  <0.03  --   --   --   --   < > = values in this interval not displayed.  Estimated Creatinine Clearance: 151 mL/min (by C-G formula based on SCr of 0.85 mg/dL).   Medications:  Infusions:    Assessment: New DVT/PE, changing heparin to Apixaban. Note his weight is >120kg, BMI >40 so per ISTH 2016 guidelines a DOAC is not recommended due to laock of clinical data. Discussed this information with Dr. Arthor CaptainElmahi. We have patient assistance resources available to provide anticoagulation with Apixaban for this patient, but we do not have similar resources available for warfarin + associated monitoring. Will proceed with apixaban.  Goal of Therapy:  Monitor platelets by anticoagulation protocol: Yes   Plan:  Stop heparin Apixaban 10mg  PO BID x 7 days, then 5mg  PO BID Monitor for bleeding complications  Estella HuskMichelle Marg Macmaster, Pharm.D., BCPS, AAHIVP Clinical Pharmacist Phone: [phone] or [phone] Pager: [phone] 05/14/2016, 10:53 AM

## 2016-05-14 NOTE — Progress Notes (Signed)
PROGRESS NOTE  Kirk Lucas  ZOX:[id number] DOB: 14-Sep-1960 DOA: 05/12/2016 PCP: No primary care provider on file. Outpatient Specialists:  Subjective: Continues to be short of breath, currently on 6 L of oxygen sats barely above 90%. Denies any chest pain or hemoptysis. Lower extremity edema.  Brief Narrative:  Kirk Lucas is a 56 y.o. 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  The patient was in his usual state of health until a few weeks ago when he started to have swelling in his right leg) he has chronic left leg swelling, but not usually right leg). Then about one week ago, he started to have constant aching pain in his right leg, moderate in intensity, and the calf and behind the knee, in addition to redness and worsening swelling. Around that time he also started to notice shortness of breath with even the slightest exertion, without chest pain, orthopnea, PND, wheezing, sputum, or cough. He had no hemoptysis, no recent surgery, no recent immobility. He had no previous PE. He has had no weight loss, no known cancer.  Assessment & Plan:   Principal Problem:   Bilateral pulmonary embolism (HCC) Active Problems:   Chronic diastolic CHF (congestive heart failure) (HCC)   Morbid (severe) obesity due to excess calories (HCC)   Chronic respiratory failure with hypoxia and hypercapnia (HCC)   PE and DVT:  -Bilateral PE, submassive with evidence of right heart strain. -2-D echo ordered, showed LVEF of 60-65% with severe LVH -Troponin is negative at 0.03 -PCCM , consulted, no indication for thrombolysis, hemodynamically stable. -Patient is on heparin drip, switched to Eliquis for PE/DVT treatment. -Discussed with pharmacist, although the data is limited for use of DOACs on patients who weighs > 120 kg, I think Eliquis will be a good choice  for him as we can provide that to him [facility] -Asked pharmacist to look if we can check activated factor X.  Acute respiratory failure with hypoxia -On 6 L of oxygen through high flow nasal cannula this morning, will wean to room air. -Per PCCM, he will likely need oxygen on discharge. -Has history of possible obesity hypoventilation syndrome, follows with Dr. Sherene Sires as outpatient.  COPD:  Inactive. -Continue home inhaler Utibron  Acute on chronic diastolic CHF -Although his BNP is 112 he has lower extremity edema, increase in his Lasix to 40 mg twice a day IV. -On metoprolol and furosemide continued.  HTN -Continue current medications.   DVT prophylaxis: This is switched to liquids. Code Status: Full Code Family Communication:  Disposition Plan: Transfer to telemetry Diet: Diet 2 gram sodium Room service appropriate? Yes; Fluid consistency: Thin; Fluid restriction: 1500 mL Fluid  Consultants:   PCCM  Procedures:   None  Antimicrobials:   None   Objective: Vitals:   05/14/16 0726 05/14/16 0757 05/14/16 1120 05/14/16 1150  BP: 123/66  115/82 110/87  Pulse: 62 (!) 56 69 70  Resp: 17 12  16   Temp: 97.9 F (36.6 C)   97.6 F (36.4 C)  TempSrc: Oral   Oral  SpO2: (!) 88% 91%  91%  Weight:      Height:        Intake/Output Summary (Last 24 hours) at 05/14/16 1211 Last data filed at 05/14/16 1151  Gross per 24 hour  Intake  1135.8 ml  Output             4325 ml  Net          -3189.2 ml   Filed Weights   05/12/16 1524 05/12/16 2255  Weight: (!) 149.2 kg (329 lb) (!) 149 kg (328 lb 7.8 oz)    Examination: General exam: Appears calm and comfortable  Respiratory system: Clear to auscultation. Respiratory effort normal. Cardiovascular system: S1 & S2 heard, RRR. No JVD, murmurs, rubs, gallops or clicks. No pedal edema. Gastrointestinal system: Abdomen is nondistended, soft and nontender. No organomegaly or masses felt.  Normal bowel sounds heard. Central nervous system: Alert and oriented. No focal neurological deficits. Extremities: Symmetric 5 x 5 power. Skin: No rashes, lesions or ulcers Psychiatry: Judgement and insight appear normal. Mood & affect appropriate.   Data Reviewed: I have personally reviewed following labs and imaging studies  CBC:  Recent Labs Lab 05/12/16 1545 05/13/16 0333 05/14/16 0235  WBC 13.9* 11.9* 13.7*  HGB 16.8 15.5 15.6  HCT 52.1* 48.3 50.5  MCV 98.5 98.8 100.8*  PLT 154 138* 158   Basic Metabolic Panel:  Recent Labs Lab 05/12/16 1545 05/14/16 0235  NA 137 138  K 3.9 4.5  CL 99* 98*  CO2 28 31  GLUCOSE 118* 107*  BUN 16 14  CREATININE 1.09 0.85  CALCIUM 8.9 8.8*   GFR: Estimated Creatinine Clearance: 151 mL/min (by C-G formula based on SCr of 0.85 mg/dL). Liver Function Tests: No results for input(s): AST, ALT, ALKPHOS, BILITOT, PROT, ALBUMIN in the last 168 hours. No results for input(s): LIPASE, AMYLASE in the last 168 hours. No results for input(s): AMMONIA in the last 168 hours. Coagulation Profile: No results for input(s): INR, PROTIME in the last 168 hours. Cardiac Enzymes:  Recent Labs Lab 05/12/16 1545 05/12/16 2347 05/13/16 0333  TROPONINI 0.03* 0.03* <0.03   BNP (last 3 results) No results for input(s): PROBNP in the last 8760 hours. HbA1C: No results for input(s): HGBA1C in the last 72 hours. CBG: No results for input(s): GLUCAP in the last 168 hours. Lipid Profile: No results for input(s): CHOL, HDL, LDLCALC, TRIG, CHOLHDL, LDLDIRECT in the last 72 hours. Thyroid Function Tests: No results for input(s): TSH, T4TOTAL, FREET4, T3FREE, THYROIDAB in the last 72 hours. Anemia Panel: No results for input(s): VITAMINB12, FOLATE, FERRITIN, TIBC, IRON, RETICCTPCT in the last 72 hours. Urine analysis:    Component Value Date/Time   COLORURINE YELLOW 05/12/2016 2330   APPEARANCEUR CLEAR 05/12/2016 2330   LABSPEC >1.046 (H) 05/12/2016  2330   PHURINE 5.0 05/12/2016 2330   GLUCOSEU 50 (A) 05/12/2016 2330   HGBUR NEGATIVE 05/12/2016 2330   BILIRUBINUR NEGATIVE 05/12/2016 2330   KETONESUR 5 (A) 05/12/2016 2330   PROTEINUR 100 (A) 05/12/2016 2330   NITRITE NEGATIVE 05/12/2016 2330   LEUKOCYTESUR NEGATIVE 05/12/2016 2330   Sepsis Labs: @LABRCNTIP (procalcitonin:4,lacticidven:4)  ) Recent Results (from the past 240 hour(s))  MRSA PCR Screening     Status: None   Collection Time: 05/13/16 12:52 AM  Result Value Ref Range Status   MRSA by PCR NEGATIVE NEGATIVE Final    Comment:        The GeneXpert MRSA Assay (FDA approved for NASAL specimens only), is one component of a comprehensive MRSA colonization surveillance program. It is not intended to diagnose MRSA infection nor to guide or monitor treatment for MRSA infections.      Invalid input(s): PROCALCITONIN, LACTICACIDVEN   Radiology Studies: Ct Angio Chest Pe  W And/or Wo Contrast  Result Date: 05/12/2016 CLINICAL DATA:  Shortness of Breath and known right lower extremity deep venous thrombosis. EXAM: CT ANGIOGRAPHY CHEST WITH CONTRAST TECHNIQUE: Multidetector CT imaging of the chest was performed using the standard protocol during bolus administration of intravenous contrast. Multiplanar CT image reconstructions and MIPs were obtained to evaluate the vascular anatomy. CONTRAST:  100 mL Isovue 370 COMPARISON:  08/14/2015 FINDINGS: Cardiovascular: Thoracic aorta demonstrates atherosclerotic calcifications without aneurysmal dilatation or dissection. The pulmonary artery is well visualized with bilateral pulmonary filling defects consistent with emboli. These are much greater on the right than the left. The RV/LV ratio is 1 consistent with mild heart strain. Mediastinum/Nodes: The thoracic inlet is within normal limits. No significant hilar or mediastinal adenopathy is noted. Scattered small mediastinal lymph nodes are noted stable from the previous exam. Lungs/Pleura:  5 mm nodule is again noted in the posterior aspect of the right upper lobe best seen on image number 45 of series 6. Mild left basilar atelectasis is noted. No sizable effusion or pneumothorax is noted. Upper Abdomen: Within normal limits. Musculoskeletal: Mild degenerative change of the thoracic spine is noted. No acute bony abnormality is seen. Review of the MIP images confirms the above findings. IMPRESSION: Positive for acute PE right greater than left with CT evidence of right heart strain (RV/LV Ratio = 1) consistent with at least submassive (intermediate risk) PE. The presence of right heart strain has been associated with an increased risk of morbidity and mortality. Please activate Code PE by paging [phone]. Stable 5 mm nodule in the right upper lobe. New left basilar atelectasis. Critical Value/emergent results were called by telephone at the time of interpretation on 05/12/2016 at 7:34 pm to Dr. Loren Racer , who verbally acknowledged these results. Electronically Signed   By: Alcide Clever M.D.   On: 05/12/2016 19:37   US Venous Img Lower Unilateral Right  Result Date: 05/12/2016 CLINICAL DATA:  Sudden onset right calf pain for several days, initial encounter EXAM: RIGHT LOWER EXTREMITY VENOUS DOPPLER ULTRASOUND TECHNIQUE: Gray-scale sonography with graded compression, as well as color Doppler and duplex ultrasound were performed to evaluate the lower extremity deep venous systems from the level of the common femoral vein and including the common femoral, femoral, profunda femoral, popliteal and calf veins including the posterior tibial, peroneal and gastrocnemius veins when visible. The superficial great saphenous vein was also interrogated. Spectral Doppler was utilized to evaluate flow at rest and with distal augmentation maneuvers in the common femoral, femoral and popliteal veins. COMPARISON:  None. FINDINGS: Contralateral Common Femoral Vein: Respiratory phasicity is normal and symmetric  with the symptomatic side. No evidence of thrombus. Normal compressibility. Common Femoral Vein: No evidence of thrombus. Normal compressibility, respiratory phasicity and response to augmentation. Saphenofemoral Junction: No evidence of thrombus. Normal compressibility and flow on color Doppler imaging. Profunda Femoral Vein: Echogenic thrombus is noted within with decreased compressibility. Femoral Vein: Echogenic thrombus is noted within with decreased compressibility. Popliteal Vein: Echogenic thrombus is noted within with decreased compressibility. Calf Veins: No evidence of thrombus. Normal compressibility and flow on color Doppler imaging. Superficial Great Saphenous Vein: Mild thrombus is noted within the greater saphenous vein just below the saphenous femoral junction. Venous Reflux:  None. Other Findings:  None. IMPRESSION: Deep venous thrombosis within the right profunda femoral, superficial femoral and popliteal veins as well as a small focus within the greater saphenous vein. Electronically Signed   By: Alcide Clever M.D.   On: 05/12/2016 18:32  Dg Chest Port 1 View  Result Date: 05/12/2016 CLINICAL DATA:  RIGHT leg swelling for 2 weeks.  Pain for 6 days EXAM: PORTABLE CHEST 1 VIEW COMPARISON:  CT 08/14/2015 FINDINGS: Normal mediastinum and cardiac silhouette. Chronic central bronchitic markings. Normal pulmonary vasculature. No effusion, infiltrate, or pneumothorax. IMPRESSION: Chronic bronchitic markings.  No acute findings Electronically Signed   By: Genevive Bi M.D.   On: 05/12/2016 16:22        Scheduled Meds: . apixaban  10 mg Oral BID   Followed by  . [START ON 05/21/2016] apixaban  5 mg Oral BID  . aspirin EC  81 mg Oral Daily  . furosemide  40 mg Intravenous BID  . Indacaterol-Glycopyrrolate  1 capsule Inhalation BID  . mouth rinse  15 mL Mouth Rinse BID  . metoprolol tartrate  25 mg Oral BID  . sodium chloride flush  3 mL Intravenous Q12H   Continuous Infusions:     LOS: 2 days    Time spent: 35 minutes    Naira Standiford A, MD [facility] Pager [phone]  If 7PM-7AM, please contact night-coverage [email or web address] Password [facility] 05/14/2016, 12:11 PM

## 2016-05-14 NOTE — Progress Notes (Signed)
Patient transferred to 2W34 by RN and NT by bed.

## 2016-05-15 LAB — CBC
HEMATOCRIT: 50.5 % (ref 39.0–52.0)
HEMOGLOBIN: 15.9 g/dL (ref 13.0–17.0)
MCH: 32.1 pg (ref 26.0–34.0)
MCHC: 31.5 g/dL (ref 30.0–36.0)
MCV: 101.8 fL — ABNORMAL HIGH (ref 78.0–100.0)
Platelets: 160 10*3/uL (ref 150–400)
RBC: 4.96 MIL/uL (ref 4.22–5.81)
RDW: 14.7 % (ref 11.5–15.5)
WBC: 11.1 10*3/uL — AB (ref 4.0–10.5)

## 2016-05-15 LAB — BASIC METABOLIC PANEL
ANION GAP: 5 (ref 5–15)
BUN: 17 mg/dL (ref 6–20)
CHLORIDE: 95 mmol/L — AB (ref 101–111)
CO2: 37 mmol/L — AB (ref 22–32)
Calcium: 8.7 mg/dL — ABNORMAL LOW (ref 8.9–10.3)
Creatinine, Ser: 0.96 mg/dL (ref 0.61–1.24)
GFR calc non Af Amer: >60 mL/min (ref >60)
Glucose, Bld: 105 mg/dL — ABNORMAL HIGH (ref 65–99)
POTASSIUM: 4.2 mmol/L (ref 3.5–5.1)
Sodium: 137 mmol/L (ref 135–145)

## 2016-05-15 MED ORDER — FUROSEMIDE 10 MG/ML IJ SOLN
60.0000 mg | Freq: Two times a day (BID) | INTRAMUSCULAR | Status: DC
  Administered 2016-05-15 – 2016-05-17 (×5): 60 mg via INTRAVENOUS
  Filled 2016-05-15 (×5): qty 6

## 2016-05-15 MED ORDER — GUAIFENESIN ER 600 MG PO TB12
1200.0000 mg | ORAL_TABLET | Freq: Two times a day (BID) | ORAL | Status: DC
  Administered 2016-05-15 – 2016-05-17 (×5): 1200 mg via ORAL
  Filled 2016-05-15 (×5): qty 2

## 2016-05-15 NOTE — Progress Notes (Signed)
PROGRESS NOTE  Haynes Giannotti  WUJ:[id number] DOB: [birth year] DOA: 05/12/2016 PCP: No primary care provider on file. Outpatient Specialists:  Subjective: Continues to be short of breath, feels is better than yesterday but not even close to his baseline. His sats low to mid 90s on 5 L of oxygen.  Brief Narrative:  Dietrick Barris is a 56 y.o. 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  The patient was in his usual state of health until a few weeks ago when he started to have swelling in his right leg) he has chronic left leg swelling, but not usually right leg). Then about one week ago, he started to have constant aching pain in his right leg, moderate in intensity, and the calf and behind the knee, in addition to redness and worsening swelling. Around that time he also started to notice shortness of breath with even the slightest exertion, without chest pain, orthopnea, PND, wheezing, sputum, or cough. He had no hemoptysis, no recent surgery, no recent immobility. He had no previous PE. He has had no weight loss, no known cancer.  Assessment & Plan:   Principal Problem:   Bilateral pulmonary embolism (HCC) Active Problems:   Chronic diastolic CHF (congestive heart failure) (HCC)   Morbid (severe) obesity due to excess calories (HCC)   Chronic respiratory failure with hypoxia and hypercapnia (HCC)   PE and DVT:  -Bilateral PE, submassive with evidence of right heart strain. -2-D echo ordered, showed LVEF of 60-65% with severe LVH -Troponin is negative at 0.03 -PCCM , consulted, no indication for thrombolysis, hemodynamically stable. -Patient was on heparin drip, switched to Eliquis for PE/DVT treatment. -Discussed with pharmacist, although the data is limited for use of DOACs on patients who weighs > 120 kg, I think Eliquis will be a good choice  for him as we can provide that to him [facility]. -Asked pharmacist to look if we can check activated factor X.  Acute respiratory failure with hypoxia -On 5 L of oxygen through high flow nasal cannula this morning, will wean to room air. -Per PCCM, he will likely need oxygen on discharge. -Has history of possible obesity hypoventilation syndrome, follows with Dr. Sherene Sires as outpatient. -Per CM no home oxygen will be provided by [facility] as charity, needs desaturation screen.  Acute on chronic diastolic CHF -Although his BNP is 112 he has lower extremity edema, started on Lasix. -His 2-D echo showed LVEF of 60-65% with normal wall motion abnormality. -Has moderate to severe elevation of pulmonary artery pressure (likely secondary to recent PE) -I have increased Lasix to 60 mg twice a day, continue to wean oxygen.  COPD:  Inactive. -Continue home inhaler Utibron  HTN -Continue current medications.   DVT prophylaxis: Eliquis Code Status: Full Code Family Communication:  Disposition Plan: Discharge home when oxygen requirements less than 3 L of oxygen. Diet: Diet 2 gram sodium Room service appropriate? Yes; Fluid consistency: Thin; Fluid restriction: 1500 mL Fluid  Consultants:   PCCM  Procedures:   None  Antimicrobials:   None   Objective: Vitals:   05/14/16 1500 05/14/16 1851 05/14/16 2116 05/15/16 0453  BP: 100/70 112/66 (!) 109/54 110/60  Pulse: (!) 56 62 (!) 57 (!) 57  Resp: 17 14 18 18   Temp: 98.3 F (36.8 C) 97.9 F (36.6 C) 97.7 F (36.5 C) 97.6 F (36.4 C)  TempSrc: Oral  Oral Oral  SpO2: 93% 90% 93% 95%  Weight:    (!) 144 kg (317 lb 7.4 oz)  Height:        Intake/Output Summary (Last 24 hours) at 05/15/16 1127 Last data filed at 05/15/16 0838  Gross per 24 hour  Intake              600 ml  Output             3525 ml  Net            -2925 ml   Filed Weights   05/12/16 1524 05/12/16 2255 05/15/16 0453  Weight: (!) 149.2 kg (329  lb) (!) 149 kg (328 lb 7.8 oz) (!) 144 kg (317 lb 7.4 oz)    Examination: General exam: Appears calm and comfortable  Respiratory system: Clear to auscultation. Respiratory effort normal. Cardiovascular system: S1 & S2 heard, RRR. No JVD, murmurs, rubs, gallops or clicks. No pedal edema. Gastrointestinal system: Abdomen is nondistended, soft and nontender. No organomegaly or masses felt. Normal bowel sounds heard. Central nervous system: Alert and oriented. No focal neurological deficits. Extremities: Symmetric 5 x 5 power. Skin: No rashes, lesions or ulcers Psychiatry: Judgement and insight appear normal. Mood & affect appropriate.   Data Reviewed: I have personally reviewed following labs and imaging studies  CBC:  Recent Labs Lab 05/12/16 1545 05/13/16 0333 05/14/16 0235 05/15/16 0329  WBC 13.9* 11.9* 13.7* 11.1*  HGB 16.8 15.5 15.6 15.9  HCT 52.1* 48.3 50.5 50.5  MCV 98.5 98.8 100.8* 101.8*  PLT 154 138* 158 160   Basic Metabolic Panel:  Recent Labs Lab 05/12/16 1545 05/14/16 0235 05/15/16 0329  NA 137 138 137  K 3.9 4.5 4.2  CL 99* 98* 95*  CO2 28 31 37*  GLUCOSE 118* 107* 105*  BUN 16 14 17   CREATININE 1.09 0.85 0.96  CALCIUM 8.9 8.8* 8.7*   GFR: Estimated Creatinine Clearance: 131.2 mL/min (by C-G formula based on SCr of 0.96 mg/dL). Liver Function Tests: No results for input(s): AST, ALT, ALKPHOS, BILITOT, PROT, ALBUMIN in the last 168 hours. No results for input(s): LIPASE, AMYLASE in the last 168 hours. No results for input(s): AMMONIA in the last 168 hours. Coagulation Profile: No results for input(s): INR, PROTIME in the last 168 hours. Cardiac Enzymes:  Recent Labs Lab 05/12/16 1545 05/12/16 2347 05/13/16 0333  TROPONINI 0.03* 0.03* <0.03   BNP (last 3 results) No results for input(s): PROBNP in the last 8760 hours. HbA1C: No results for input(s): HGBA1C in the last 72 hours. CBG: No results for input(s): GLUCAP in the last 168  hours. Lipid Profile: No results for input(s): CHOL, HDL, LDLCALC, TRIG, CHOLHDL, LDLDIRECT in the last 72 hours. Thyroid Function Tests: No results for input(s): TSH, T4TOTAL, FREET4, T3FREE, THYROIDAB in the last 72 hours. Anemia Panel: No results for input(s): VITAMINB12, FOLATE, FERRITIN, TIBC, IRON, RETICCTPCT in the last 72 hours. Urine analysis:    Component Value Date/Time   COLORURINE YELLOW 05/12/2016 2330   APPEARANCEUR CLEAR 05/12/2016 2330   LABSPEC >1.046 (H) 05/12/2016 2330   PHURINE 5.0 05/12/2016 2330   GLUCOSEU 50 (A) 05/12/2016 2330   HGBUR NEGATIVE 05/12/2016 2330   BILIRUBINUR NEGATIVE 05/12/2016 2330   KETONESUR 5 (A) 05/12/2016 2330   PROTEINUR 100 (A) 05/12/2016 2330   NITRITE NEGATIVE 05/12/2016 2330   LEUKOCYTESUR NEGATIVE 05/12/2016 2330   Sepsis Labs: @LABRCNTIP (procalcitonin:4,lacticidven:4)  ) Recent Results (from the past 240 hour(s))  MRSA  PCR Screening     Status: None   Collection Time: 05/13/16 12:52 AM  Result Value Ref Range Status   MRSA by PCR NEGATIVE NEGATIVE Final    Comment:        The GeneXpert MRSA Assay (FDA approved for NASAL specimens only), is one component of a comprehensive MRSA colonization surveillance program. It is not intended to diagnose MRSA infection nor to guide or monitor treatment for MRSA infections.      Invalid input(s): PROCALCITONIN, LACTICACIDVEN   Radiology Studies: No results found.      Scheduled Meds: . apixaban  10 mg Oral BID   Followed by  . [START ON 05/21/2016] apixaban  5 mg Oral BID  . aspirin EC  81 mg Oral Daily  . furosemide  60 mg Intravenous BID  . Indacaterol-Glycopyrrolate  1 capsule Inhalation BID  . mouth rinse  15 mL Mouth Rinse BID  . metoprolol tartrate  25 mg Oral BID  . sodium chloride flush  3 mL Intravenous Q12H   Continuous Infusions:    LOS: 3 days    Time spent: 35 minutes    Darl Brisbin A, MD [facility] Pager [phone]  If  7PM-7AM, please contact night-coverage [email or web address] Password [email or web address] 05/15/2016, 11:27 AM

## 2016-05-15 NOTE — Discharge Instructions (Signed)
Information on my medicine - ELIQUIS (apixaban)  This medication education was reviewed with me or my healthcare representative as part of my discharge preparation.  The pharmacist that spoke with me during my hospital stay was:  Kirk Lucas, Kirk Lucas, [facility]PH  Why was Eliquis prescribed for you? Eliquis was prescribed to treat blood clots that may have been found in the veins of your legs (deep vein thrombosis) or in your lungs (pulmonary embolism) and to reduce the risk of them occurring again.  What do You need to know about Eliquis ? The starting dose is 10 mg (two 5 mg tablets) taken TWICE daily for the FIRST SEVEN (7) DAYS, then on (enter date)  05/21/2016 the dose is reduced to ONE 5 mg tablet taken TWICE daily.  Eliquis may be taken with or without food.   Try to take the dose about the same time in the morning and in the evening. If you have difficulty swallowing the tablet whole please discuss with your pharmacist how to take the medication safely.  Take Eliquis exactly as prescribed and DO NOT stop taking Eliquis without talking to the doctor who prescribed the medication.  Stopping may increase your risk of developing a new blood clot.  Refill your prescription before you run out.  After discharge, you should have regular check-up appointments with your healthcare provider that is prescribing your Eliquis.    What do you do if you miss a dose? If a dose of ELIQUIS is not taken at the scheduled time, take it as soon as possible on the same day and twice-daily administration should be resumed. The dose should not be doubled to make up for a missed dose.  Important Safety Information A possible side effect of Eliquis is bleeding. You should call your healthcare provider right away if you experience any of the following: ? Bleeding from an injury or your nose that does not stop. ? Unusual colored urine (red or dark brown) or unusual colored stools (red or  black). ? Unusual bruising for unknown reasons. ? A serious fall or if you hit your head (even if there is no bleeding).  Some medicines may interact with Eliquis and might increase your risk of bleeding or clotting while on Eliquis. To help avoid this, consult your healthcare provider or pharmacist prior to using any new prescription or non-prescription medications, including herbals, vitamins, non-steroidal anti-inflammatory drugs (NSAIDs) and supplements.  This website has more information on Eliquis (apixaban): [email or web address]://[email or web address].com/eliquis/home

## 2016-05-15 NOTE — Evaluation (Addendum)
Physical Therapy Evaluation Patient Details Name: Kirk Lucas MRN: [id number] DOB: Jan 13, 1961 Today's Date: 05/15/2016   History of Present Illness  56 y.o. male with h/o COPD (can't afford home O2), smoking, CHF admitted with RLE DVT,  B pulmonary embolisms, respiratory failure, acute CHF. Marland Kitchen   Clinical Impression  Pt is independent with mobility, he ambulated 220' without an assistive device with no loss of balance. SaO2 82% on RA walking, 87% on 6L O2 walking. 3/4 dyspnea. No further PT indicated as he is independent with mobility, however, he would benefit from outpatient cardiopulmonary rehab (if covered by medicaid once approved) following acute stay. Encouraged pt to ambulate at least BID in halls with O2 to prevent further deconditioning during hospitalization.  PT signing off.     Follow Up Recommendations Other (comment) (cardiopulmonary rehab-? If covered by [facility]?)    Equipment Recommendations  Other (comment) (home O2)    Recommendations for Other Services       Precautions / Restrictions Precautions Precautions: Other (comment) Precaution Comments: monitor O2, pt denies h/o falls in past 1 year Restrictions Weight Bearing Restrictions: No      Mobility  Bed Mobility Overal bed mobility: Independent                Transfers Overall transfer level: Independent                  Ambulation/Gait Ambulation/Gait assistance: Independent Ambulation Distance (Feet): 220 Feet Assistive device: None Gait Pattern/deviations: WFL(Within Functional Limits)   Gait velocity interpretation: at or above normal speed for age/gender General Gait Details: steady without AD, no LOB, SaO2 82% on RA with ambulation, 87% on 6L O2 (pt requires frequent VCs for pursed lip breathing as he tends to mouth breathe), 3/4 dyspnea  Stairs            Wheelchair Mobility    Modified Rankin (Stroke Patients Only)       Balance Overall balance assessment:  Independent                                           Pertinent Vitals/Pain Pain Assessment: 0-10 Pain Score: 7  Pain Location: R calf when walking, no pain at rest Pain Descriptors / Indicators: Aching Pain Intervention(s): Limited activity within patient's tolerance;Monitored during session    Home Living Family/patient expects to be discharged to:: Private residence Living Arrangements: Other relatives (sister) Available Help at Discharge: Family;Available PRN/intermittently Type of Home: House Home Access: Stairs to enter Entrance Stairs-Rails: Can reach both;Left;Right Entrance Stairs-Number of Steps: 3 Home Layout: One level Home Equipment: None      Prior Function Level of Independence: Independent         Comments: walks without AD, distance limited recently due to RLE pain from DVT, drives     Hand Dominance        Extremity/Trunk Assessment   Upper Extremity Assessment Upper Extremity Assessment: Overall WFL for tasks assessed    Lower Extremity Assessment Lower Extremity Assessment: Overall WFL for tasks assessed    Cervical / Trunk Assessment Cervical / Trunk Assessment: Normal  Communication   Communication: No difficulties  Cognition Arousal/Alertness: Awake/alert Behavior During Therapy: WFL for tasks assessed/performed Overall Cognitive Status: Within Functional Limits for tasks assessed                      General  Comments      Exercises     Assessment/Plan    PT Assessment Patent does not need any further PT services  PT Problem List         PT Treatment Interventions      PT Goals (Current goals can be found in the Care Plan section)  Acute Rehab PT Goals Patient Stated Goal: decrease pain, be able to walk farther PT Goal Formulation: All assessment and education complete, DC therapy    Frequency     Barriers to discharge        Co-evaluation               End of Session Equipment  Utilized During Treatment: Gait belt Activity Tolerance: Patient limited by fatigue Patient left: in bed;with call bell/phone within reach Nurse Communication: Mobility status PT Visit Diagnosis: Pain Pain - Right/Left: Right Pain - part of body: Leg         Time: 1232-1252 PT Time Calculation (min) (ACUTE ONLY): 20 min   Charges:   PT Evaluation $PT Eval Low Complexity: 1 Procedure     PT G CodesRalene Bathe:         Allyiah Gartner Kistler 05/15/2016, 1:00 PM [phone]

## 2016-05-15 NOTE — Progress Notes (Signed)
SATURATION QUALIFICATIONS: (This note is used to comply with regulatory documentation for home oxygen)  Patient Saturations on Room Air at Rest = 87%  Patient Saturations on Room Air while Ambulating = 82%  Patient Saturations on 6 Liters of oxygen while Ambulating = 87%  Please briefly explain why patient needs home oxygen: to maintain appropriate SaO2 levels.   Ralene BatheUhlenberg, Zafira Munos Kistler PT 05/15/2016  [phone]

## 2016-05-16 LAB — CBC
HEMATOCRIT: 52.1 % — AB (ref 39.0–52.0)
HEMOGLOBIN: 16.7 g/dL (ref 13.0–17.0)
MCH: 32.4 pg (ref 26.0–34.0)
MCHC: 32.1 g/dL (ref 30.0–36.0)
MCV: 101 fL — ABNORMAL HIGH (ref 78.0–100.0)
Platelets: 172 10*3/uL (ref 150–400)
RBC: 5.16 MIL/uL (ref 4.22–5.81)
RDW: 14.3 % (ref 11.5–15.5)
WBC: 11.6 10*3/uL — AB (ref 4.0–10.5)

## 2016-05-16 LAB — BASIC METABOLIC PANEL
ANION GAP: 8 (ref 5–15)
BUN: 16 mg/dL (ref 6–20)
CALCIUM: 8.9 mg/dL (ref 8.9–10.3)
CHLORIDE: 92 mmol/L — AB (ref 101–111)
CO2: 36 mmol/L — AB (ref 22–32)
Creatinine, Ser: 0.96 mg/dL (ref 0.61–1.24)
GFR calc non Af Amer: >60 mL/min (ref >60)
Glucose, Bld: 103 mg/dL — ABNORMAL HIGH (ref 65–99)
Potassium: 4.1 mmol/L (ref 3.5–5.1)
Sodium: 136 mmol/L (ref 135–145)

## 2016-05-16 LAB — MAGNESIUM: Magnesium: 1.7 mg/dL (ref 1.7–2.4)

## 2016-05-16 MED ORDER — METOLAZONE 5 MG PO TABS
5.0000 mg | ORAL_TABLET | Freq: Once | ORAL | Status: AC
  Administered 2016-05-16: 5 mg via ORAL
  Filled 2016-05-16: qty 1

## 2016-05-16 NOTE — Progress Notes (Signed)
PROGRESS NOTE  Kirk ReedyRobert Beer  NWG:[id number] DOB: Feb 09, 1961 DOA: 05/12/2016 PCP: No primary care provider on file. Outpatient Specialists:  Subjective: He continues to improve clinically. He reported less SOB, but he is still on 5-6 L of oxygen. Continue diuresis for today, evaluate for discharge in a.m.  Brief Narrative:  Kirk ReedyRobert Marion is a 56 y.o. 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  The patient was in his usual state of health until a few weeks ago when he started to have swelling in his right leg) he has chronic left leg swelling, but not usually right leg). Then about one week ago, he started to have constant aching pain in his right leg, moderate in intensity, and the calf and behind the knee, in addition to redness and worsening swelling. Around that time he also started to notice shortness of breath with even the slightest exertion, without chest pain, orthopnea, PND, wheezing, sputum, or cough. He had no hemoptysis, no recent surgery, no recent immobility. He had no previous PE. He has had no weight loss, no known cancer.  Assessment & Plan:   Principal Problem:   Bilateral pulmonary embolism (HCC) Active Problems:   Chronic diastolic CHF (congestive heart failure) (HCC)   Morbid (severe) obesity due to excess calories (HCC)   Chronic respiratory failure with hypoxia and hypercapnia (HCC)   PE and DVT:  -Bilateral PE, submassive with evidence of right heart strain. -2-D echo ordered, showed LVEF of 60-65% with severe LVH -Troponin is negative at 0.03 -PCCM , consulted, no indication for thrombolysis, hemodynamically stable. -Patient was on heparin drip, switched to Eliquis for PE/DVT treatment. -Currently on Eliquis, continue  Acute respiratory failure with hypoxia -On 5 L of oxygen through high flow nasal cannula this  morning, will wean to room air. -Per PCCM, he will likely need oxygen on discharge. -Has history of possible obesity hypoventilation syndrome, follows with Dr. Sherene SiresWert as outpatient. -Per CM [facility], desaturation to screen done. -Still desaturate on 6 L of oxygen with ambulation.  Acute on chronic diastolic CHF -Although his BNP is 112 he has lower extremity edema, started on Lasix. -His 2-D echo showed LVEF of 60-65% with normal wall motion abnormality. -Has moderate to severe elevation of pulmonary artery pressure (likely secondary to recent PE) -I have increased Lasix to 60 mg twice a day, continue to wean oxygen. -Less urine output I will give 1 dose of metolazone with Lasix today.  COPD:  Inactive. -Continue home inhaler Utibron  HTN -Continue current medications.   DVT prophylaxis: Eliquis Code Status: Full Code Family Communication:  Disposition Plan: Discharge home when oxygen requirements less than 3 L of oxygen. Diet: Diet 2 gram sodium Room service appropriate? Yes; Fluid consistency: Thin; Fluid restriction: 1500 mL Fluid  Consultants:   PCCM  Procedures:   None  Antimicrobials:   None   Objective: Vitals:   05/15/16 0453 05/15/16 1254 05/15/16 2015 05/16/16 0543  BP: 110/60  129/69 112/65  Pulse: (!) 57  63 (!) 58  Resp: 18  18 16   Temp: 97.6 F (36.4 C)  97.5 F (36.4 C) 98 F (36.7 C)  TempSrc: Oral  Oral Oral  SpO2: 95% (!) 82% 92% 92%  Weight: (!) 144 kg (317 lb 7.4 oz)   (!) 142.8 kg (314 lb 14.4 oz)  Height:        Intake/Output Summary (Last 24 hours) at 05/16/16 1328 Last data filed at 05/16/16 1137  Gross per 24 hour  Intake                0 ml  Output             2550 ml  Net            -2550 ml   Filed Weights   05/12/16 2255 05/15/16 0453 05/16/16 0543  Weight: (!) 149 kg (328 lb 7.8 oz) (!) 144 kg (317 lb 7.4 oz) (!) 142.8 kg (314 lb 14.4 oz)    Examination: General exam: Appears calm  and comfortable  Respiratory system: Clear to auscultation. Respiratory effort normal. Cardiovascular system: S1 & S2 heard, RRR. No JVD, murmurs, rubs, gallops or clicks. No pedal edema. Gastrointestinal system: Abdomen is nondistended, soft and nontender. No organomegaly or masses felt. Normal bowel sounds heard. Central nervous system: Alert and oriented. No focal neurological deficits. Extremities: Symmetric 5 x 5 power. Skin: No rashes, lesions or ulcers Psychiatry: Judgement and insight appear normal. Mood & affect appropriate.   Data Reviewed: I have personally reviewed following labs and imaging studies  CBC:  Recent Labs Lab 05/12/16 1545 05/13/16 0333 05/14/16 0235 05/15/16 0329 05/16/16 0432  WBC 13.9* 11.9* 13.7* 11.1* 11.6*  HGB 16.8 15.5 15.6 15.9 16.7  HCT 52.1* 48.3 50.5 50.5 52.1*  MCV 98.5 98.8 100.8* 101.8* 101.0*  PLT 154 138* 158 160 172   Basic Metabolic Panel:  Recent Labs Lab 05/12/16 1545 05/14/16 0235 05/15/16 0329 05/16/16 0432  NA 137 138 137 136  K 3.9 4.5 4.2 4.1  CL 99* 98* 95* 92*  CO2 28 31 37* 36*  GLUCOSE 118* 107* 105* 103*  BUN 16 14 17 16   CREATININE 1.09 0.85 0.96 0.96  CALCIUM 8.9 8.8* 8.7* 8.9  MG  --   --   --  1.7   GFR: Estimated Creatinine Clearance: 130.5 mL/min (by C-G formula based on SCr of 0.96 mg/dL). Liver Function Tests: No results for input(s): AST, ALT, ALKPHOS, BILITOT, PROT, ALBUMIN in the last 168 hours. No results for input(s): LIPASE, AMYLASE in the last 168 hours. No results for input(s): AMMONIA in the last 168 hours. Coagulation Profile: No results for input(s): INR, PROTIME in the last 168 hours. Cardiac Enzymes:  Recent Labs Lab 05/12/16 1545 05/12/16 2347 05/13/16 0333  TROPONINI 0.03* 0.03* <0.03   BNP (last 3 results) No results for input(s): PROBNP in the last 8760 hours. HbA1C: No results for input(s): HGBA1C in the last 72 hours. CBG: No results for input(s): GLUCAP in the last 168  hours. Lipid Profile: No results for input(s): CHOL, HDL, LDLCALC, TRIG, CHOLHDL, LDLDIRECT in the last 72 hours. Thyroid Function Tests: No results for input(s): TSH, T4TOTAL, FREET4, T3FREE, THYROIDAB in the last 72 hours. Anemia Panel: No results for input(s): VITAMINB12, FOLATE, FERRITIN, TIBC, IRON, RETICCTPCT in the last 72 hours. Urine analysis:    Component Value Date/Time   COLORURINE YELLOW 05/12/2016 2330   APPEARANCEUR CLEAR 05/12/2016 2330   LABSPEC >1.046 (H) 05/12/2016 2330   PHURINE 5.0 05/12/2016 2330   GLUCOSEU 50 (A) 05/12/2016 2330   HGBUR NEGATIVE 05/12/2016 2330   BILIRUBINUR NEGATIVE 05/12/2016 2330   KETONESUR 5 (A) 05/12/2016 2330   PROTEINUR 100 (A) 05/12/2016 2330   NITRITE NEGATIVE 05/12/2016 2330   LEUKOCYTESUR NEGATIVE 05/12/2016 2330   Sepsis Labs: @LABRCNTIP (procalcitonin:4,lacticidven:4)  )  Recent Results (from the past 240 hour(s))  MRSA PCR Screening     Status: None   Collection Time: 05/13/16 12:52 AM  Result Value Ref Range Status   MRSA by PCR NEGATIVE NEGATIVE Final    Comment:        The GeneXpert MRSA Assay (FDA approved for NASAL specimens only), is one component of a comprehensive MRSA colonization surveillance program. It is not intended to diagnose MRSA infection nor to guide or monitor treatment for MRSA infections.      Invalid input(s): PROCALCITONIN, LACTICACIDVEN   Radiology Studies: No results found.      Scheduled Meds: . apixaban  10 mg Oral BID   Followed by  . [START ON 05/21/2016] apixaban  5 mg Oral BID  . furosemide  60 mg Intravenous BID  . guaiFENesin  1,200 mg Oral BID  . Indacaterol-Glycopyrrolate  1 capsule Inhalation BID  . mouth rinse  15 mL Mouth Rinse BID  . metoprolol tartrate  25 mg Oral BID  . sodium chloride flush  3 mL Intravenous Q12H   Continuous Infusions:    LOS: 4 days    Time spent: 35 minutes    Lenetta Piche A, MD [facility] Pager [phone]  If  7PM-7AM, please contact night-coverage [email or web address] Password [email or web address] 05/16/2016, 1:28 PM

## 2016-05-17 LAB — BASIC METABOLIC PANEL
ANION GAP: 6 (ref 5–15)
BUN: 20 mg/dL (ref 6–20)
CALCIUM: 9.1 mg/dL (ref 8.9–10.3)
CO2: 38 mmol/L — ABNORMAL HIGH (ref 22–32)
CREATININE: 1.13 mg/dL (ref 0.61–1.24)
Chloride: 90 mmol/L — ABNORMAL LOW (ref 101–111)
GLUCOSE: 125 mg/dL — AB (ref 65–99)
Potassium: 5 mmol/L (ref 3.5–5.1)
Sodium: 134 mmol/L — ABNORMAL LOW (ref 135–145)

## 2016-05-17 MED ORDER — FUROSEMIDE 40 MG PO TABS: 40.0000 mg | ORAL_TABLET | Freq: Two times a day (BID) | ORAL | 11 refills | Status: DC

## 2016-05-17 MED ORDER — TRAMADOL HCL 50 MG PO TABS: 25.0000 mg | ORAL_TABLET | Freq: Four times a day (QID) | ORAL | 0 refills | Status: DC | PRN

## 2016-05-17 MED ORDER — APIXABAN 5 MG PO TABS: ORAL_TABLET | ORAL | 0 refills | Status: DC

## 2016-05-17 MED ORDER — METOPROLOL TARTRATE 25 MG PO TABS: 25.0000 mg | ORAL_TABLET | Freq: Two times a day (BID) | ORAL | 5 refills | Status: DC

## 2016-05-17 NOTE — Care Management Note (Addendum)
Case Management Note  Patient Details  Name: Jacolyn ReedyRobert Dea MRN: [id number] Date of Birth: [birth year]  Subjective/Objective:    Patient is from home with sister, he has no PCP, no insurance, he is willing to go to [facility]W clinic, apt scheduled for him on 3/21 at 2:30.  [facility].  NCM will give him the 30 day free savings card- [facility] [address] [place] to get this and they have in stock, then he will need to go thru there patient asst program [facility].  If he runs out of the 30 day free before he heres back from patient assitance they will give him a another 30 day for $10.00 [facility]. Also for his other medications, if he is discharged during the week , [facility] but if dc over the weekend will need ast with Match Letter.   Per RN , he may need home oxygen at dc , NCM will cont to follow for dc needs.                Action/Plan:   Expected Discharge Date:  05/17/16               Expected Discharge Plan:  Home/Self Care  In-House Referral:     Discharge planning Services  CM Consult, Follow-up appt scheduled, Indigent Health Clinic, Medication Assistance  Post Acute Care Choice:    Choice offered to:  Patient  DME Arranged:  Oxygen DME Agency:  [facility]  HH [facility]    [facility]:     Status of Service:  Completed, signed off  If discussed at Long Length of Stay Meetings, dates discussed:    Additional Comments: 05/17/2016 Pt alert and oriented.  Pt states he is completley independent at home with sister.  CM confirmed with pt that he has 30 free card for eliquis, [facility] and [facility].  [facility] - agency made aware that pt is discharging today.  Bedside nurse informed that pt should not leave without supplemental oxygen  05/14/16 1018am  Should pt need home oxygen at discharge, [facility].  Pt will need to be evaluated for home oxygen by checking room air sats at rest/with ambulation, and documentation needs to be provided.  Will refer to financial counseling for possible Medicaid appliction.   Sidney AceJulie Amerson, RN,BSN Case Manager  Cherylann ParrClaxton, Jupiter Kabir S, RN 05/17/2016, 9:56 AM

## 2016-05-17 NOTE — Progress Notes (Signed)
Discharged to home with family office visits in place teaching done  

## 2016-05-17 NOTE — Discharge Summary (Addendum)
Physician Discharge Summary  Kirk Lucas ZOX:[id number] DOB: 12-13-1960 DOA: 05/12/2016  PCP: No primary care provider on file.  Admit date: 05/12/2016 Discharge date: 05/17/2016  Admitted From: Home Disposition: Home  Recommendations for Outpatient Follow-up:  1. Follow up with PCP in 1-2 weeks 2. Please obtain BMP/CBC in one week  Home Health: NA Equipment/Devices: DME oxygen  Discharge Condition: Stable CODE STATUS: Full Code Diet recommendation: Diet 2 gram sodium Room service appropriate? Yes; Fluid consistency: Thin; Fluid restriction: 1500 mL Fluid Diet - low sodium heart healthy  Brief/Interim Summary: Kirk Lucas a 56 y.o.malewith a past medical history significant for COPD with chronic hypoxic and hypercarbic respiratory failure can't afford home O2, HFpEF, MO/OSA not on CPAPwho presents with 1 week SOB.  The patient was in his usual state of health until a few weeks ago when he started to have swelling in his right leg) he has chronic left leg swelling, but not usually right leg). Then about one week ago, he started to have constant aching pain in his right leg, moderate in intensity, and the calf and behind the knee, in addition to redness and worsening swelling. Around that time he also started to notice shortness of breath with even the slightest exertion, without chest pain, orthopnea, PND, wheezing, sputum, or cough. He had no hemoptysis, no recent surgery, no recent immobility. He had no previous PE. He has had no weight loss, no known cancer.  Discharge Diagnoses:  Principal Problem:   Bilateral pulmonary embolism (HCC) Active Problems:   Chronic diastolic CHF (congestive heart failure) (HCC)   Morbid (severe) obesity due to excess calories (HCC)   Chronic respiratory failure with hypoxia and hypercapnia (HCC)    PE and DVT: -Bilateral PE, submassive with evidence of right heart strain. -2-D echo ordered, showed LVEF of 60-65% with severe  LVH -Troponin is negative at 0.03 -PCCM , consulted, no indication for thrombolysis, hemodynamically stable. -Patient was on heparin drip, switched to Eliquis for PE/DVT treatment. (Was on LD-ASA, discontinued) -Literature reviewed, although for patient >120 kg DOACs benefits over Coumadin is unclear, probably it's easier for the patient to be on Eliquis as he will have difficulty to check his INRs. -Discharged on Eliquis, follow-up with PCP and pulmonology. -After D/C patient requested pain medications, tramadol 25 mg 20 pills prescribed, per RN Kirk Lucas patient left the premises but he will call him to come back in the morning to pick the prescription.  Acute respiratory failure with hypoxia -Required 6 L of oxygen through high flow nasal cannula while is in the hospital. -Seen by PCCM, recommended oxygen on discharge. -Has history of possible obesity hypoventilation syndrome, follows with Dr. Sherene Lucas as outpatient. -Per CM no home oxygen will be provided by [facility] as charity, desaturation to screen done. -Still desaturate on 6 L of oxygen with ambulation, discharged on 6 L of oxygen.  Acute on chronic diastolic CHF -Although his BNP is 112 he has lower extremity edema, started on Lasix. -His 2-D echo showed LVEF of 60-65% with severe LVH. -Has moderate to severe elevation of pulmonary artery pressure (likely secondary to recent PE) -Diuresed with net 11.1 L of negative fluid balance on discharge, Wt from 329 down to 307 lbs on discharge. -Discharged on 40 mg of Lasix twice a day and metoprolol. Evaluate for ACEI as outpatient.  COPD: Inactive. -Continue home inhaler Utibron  HTN -Continue current medications.   Discharge Instructions  Discharge Instructions    Diet - low sodium heart healthy  Complete by:  As directed    Increase activity slowly    Complete by:  As directed      Allergies as of 05/17/2016      Reactions   Codeine Nausea Only   Duricef [cefadroxil]  Itching      Medication List    STOP taking these medications   aspirin 81 MG EC tablet   oxymetazoline 0.05 % nasal spray Commonly known as:  AFRIN     TAKE these medications   apixaban 5 MG Tabs tablet Commonly known as:  ELIQUIS Take 2 tabs BID for 7 days, then 1 tab BID   calcium carbonate 750 MG chewable tablet Commonly known as:  TUMS EX Chew 2 tablets by mouth as needed for heartburn.   furosemide 40 MG tablet Commonly known as:  LASIX Take 1 tablet (40 mg total) by mouth 2 (two) times daily. What changed:  when to take this  additional instructions   hydrocortisone cream 1 % Apply 1 application topically as needed (for rosacea).   Indacaterol-Glycopyrrolate 27.5-15.6 MCG Caps Commonly known as:  Anner Crete Place 1 capsule into inhaler and inhale 2 (two) times daily.   metoprolol tartrate 25 MG tablet Commonly known as:  LOPRESSOR Take 1 tablet (25 mg total) by mouth 2 (two) times daily.   traMADol 50 MG tablet Commonly known as:  ULTRAM Take 0.5 tablets (25 mg total) by mouth every 6 (six) hours as needed for moderate pain.            Durable Medical Equipment        Start     Ordered   05/17/16 [phone]  DME Oxygen  Once    Question Answer Comment  Mode or (Route) Nasal cannula   Liters per Minute 6   Frequency Continuous (stationary and portable oxygen unit needed)   Oxygen delivery system Gas      05/17/16 0920     Follow-up Information    [facility] Follow up on 05/20/2016.   Why:  2:30 for hospital follow up Contact information: [address] [place] [address] [phone]       Sandrea Hughs, MD Follow up in 1 week(s).   Specialty:  Pulmonary Disease Contact information: [address] [place] [address] [phone]          Allergies  Allergen Reactions  . Codeine Nausea Only  . Duricef [Cefadroxil] Itching     Consultations:  PCCM   Procedures Echo done on 05/13/16 Study Conclusions  - Left ventricle: The cavity size was normal. Wall thickness was   increased in a pattern of severe LVH. Systolic function was   normal. The estimated ejection fraction was in the range of 60%   to 65%. Wall motion was normal; there were no regional wall   motion abnormalities. - Aortic valve: Mildly to moderately calcified annulus. - Pulmonary arteries: Systolic pressure was moderately to severely   increased. PA peak pressure: 66 mm Hg (S).  Radiological studies: Ct Angio Chest Pe W And/or Wo Contrast  Result Date: 05/12/2016 CLINICAL DATA:  Shortness of Breath and known right lower extremity deep venous thrombosis. EXAM: CT ANGIOGRAPHY CHEST WITH CONTRAST TECHNIQUE: Multidetector CT imaging of the chest was performed using the standard protocol during bolus administration of intravenous contrast. Multiplanar CT image reconstructions and MIPs were obtained to evaluate the vascular anatomy. CONTRAST:  100 mL Isovue 370 COMPARISON:  08/14/2015 FINDINGS: Cardiovascular: Thoracic aorta demonstrates  atherosclerotic calcifications without aneurysmal dilatation or dissection. The pulmonary artery is well visualized with bilateral pulmonary filling defects consistent with emboli. These are much greater on the right than the left. The RV/LV ratio is 1 consistent with mild heart strain. Mediastinum/Nodes: The thoracic inlet is within normal limits. No significant hilar or mediastinal adenopathy is noted. Scattered small mediastinal lymph nodes are noted stable from the previous exam. Lungs/Pleura: 5 mm nodule is again noted in the posterior aspect of the right upper lobe best seen on image number 45 of series 6. Mild left basilar atelectasis is noted. No sizable effusion or pneumothorax is noted. Upper Abdomen: Within normal limits. Musculoskeletal: Mild degenerative change of the thoracic spine is noted. No acute bony  abnormality is seen. Review of the MIP images confirms the above findings. IMPRESSION: Positive for acute PE right greater than left with CT evidence of right heart strain (RV/LV Ratio = 1) consistent with at least submassive (intermediate risk) PE. The presence of right heart strain has been associated with an increased risk of morbidity and mortality. Please activate Code PE by paging [phone]. Stable 5 mm nodule in the right upper lobe. New left basilar atelectasis. Critical Value/emergent results were called by telephone at the time of interpretation on 05/12/2016 at 7:34 pm to Dr. Loren Racer , who verbally acknowledged these results. Electronically Signed   By: Alcide Clever M.D.   On: 05/12/2016 19:37   US Venous Img Lower Unilateral Right  Result Date: 05/12/2016 CLINICAL DATA:  Sudden onset right calf pain for several days, initial encounter EXAM: RIGHT LOWER EXTREMITY VENOUS DOPPLER ULTRASOUND TECHNIQUE: Gray-scale sonography with graded compression, as well as color Doppler and duplex ultrasound were performed to evaluate the lower extremity deep venous systems from the level of the common femoral vein and including the common femoral, femoral, profunda femoral, popliteal and calf veins including the posterior tibial, peroneal and gastrocnemius veins when visible. The superficial great saphenous vein was also interrogated. Spectral Doppler was utilized to evaluate flow at rest and with distal augmentation maneuvers in the common femoral, femoral and popliteal veins. COMPARISON:  None. FINDINGS: Contralateral Common Femoral Vein: Respiratory phasicity is normal and symmetric with the symptomatic side. No evidence of thrombus. Normal compressibility. Common Femoral Vein: No evidence of thrombus. Normal compressibility, respiratory phasicity and response to augmentation. Saphenofemoral Junction: No evidence of thrombus. Normal compressibility and flow on color Doppler imaging. Profunda Femoral  Vein: Echogenic thrombus is noted within with decreased compressibility. Femoral Vein: Echogenic thrombus is noted within with decreased compressibility. Popliteal Vein: Echogenic thrombus is noted within with decreased compressibility. Calf Veins: No evidence of thrombus. Normal compressibility and flow on color Doppler imaging. Superficial Great Saphenous Vein: Mild thrombus is noted within the greater saphenous vein just below the saphenous femoral junction. Venous Reflux:  None. Other Findings:  None. IMPRESSION: Deep venous thrombosis within the right profunda femoral, superficial femoral and popliteal veins as well as a small focus within the greater saphenous vein. Electronically Signed   By: Alcide Clever M.D.   On: 05/12/2016 18:32   Dg Chest Port 1 View  Result Date: 05/12/2016 CLINICAL DATA:  RIGHT leg swelling for 2 weeks.  Pain for 6 days EXAM: PORTABLE CHEST 1 VIEW COMPARISON:  CT 08/14/2015 FINDINGS: Normal mediastinum and cardiac silhouette. Chronic central bronchitic markings. Normal pulmonary vasculature. No effusion, infiltrate, or pneumothorax. IMPRESSION: Chronic bronchitic markings.  No acute findings Electronically Signed   By: Genevive Bi M.D.   On: 05/12/2016 16:22  Subjective:  Discharge Exam: Vitals:   05/16/16 0543 05/16/16 1350 05/16/16 2046 05/17/16 0534  BP: 112/65 102/64 135/60 112/66  Pulse: (!) 58 (!) 58 67 (!) 55  Resp: 16 18 18 18   Temp: 98 F (36.7 C) 98 F (36.7 C) 98.5 F (36.9 C) 98.1 F (36.7 C)  TempSrc: Oral Oral Oral Oral  SpO2: 92% 95% 93% 95%  Weight: (!) 142.8 kg (314 lb 14.4 oz)   (!) 139.3 kg (307 lb 1.6 oz)  Height:       General: Pt is alert, awake, not in acute distress Cardiovascular: RRR, S1/S2 +, no rubs, no gallops Respiratory: CTA bilaterally, no wheezing, no rhonchi Abdominal: Soft, NT, ND, bowel sounds + Extremities: no edema, no cyanosis   The results of significant diagnostics from this hospitalization (including  imaging, microbiology, ancillary and laboratory) are listed below for reference.    Microbiology: Recent Results (from the past 240 hour(s))  MRSA PCR Screening     Status: None   Collection Time: 05/13/16 12:52 AM  Result Value Ref Range Status   MRSA by PCR NEGATIVE NEGATIVE Final    Comment:        The GeneXpert MRSA Assay (FDA approved for NASAL specimens only), is one component of a comprehensive MRSA colonization surveillance program. It is not intended to diagnose MRSA infection nor to guide or monitor treatment for MRSA infections.      Labs: BNP (last 3 results)  Recent Labs  08/13/15 1229 08/13/15 1752 05/12/16 1545  BNP 340.4* 224.6* 111.9*   Basic Metabolic Panel:  Recent Labs Lab 05/12/16 1545 05/14/16 0235 05/15/16 0329 05/16/16 0432 05/17/16 0252  NA 137 138 137 136 134*  K 3.9 4.5 4.2 4.1 5.0  CL 99* 98* 95* 92* 90*  CO2 28 31 37* 36* 38*  GLUCOSE 118* 107* 105* 103* 125*  BUN 16 14 17 16 20   CREATININE 1.09 0.85 0.96 0.96 1.13  CALCIUM 8.9 8.8* 8.7* 8.9 9.1  MG  --   --   --  1.7  --    Liver Function Tests: No results for input(s): AST, ALT, ALKPHOS, BILITOT, PROT, ALBUMIN in the last 168 hours. No results for input(s): LIPASE, AMYLASE in the last 168 hours. No results for input(s): AMMONIA in the last 168 hours. CBC:  Recent Labs Lab 05/12/16 1545 05/13/16 0333 05/14/16 0235 05/15/16 0329 05/16/16 0432  WBC 13.9* 11.9* 13.7* 11.1* 11.6*  HGB 16.8 15.5 15.6 15.9 16.7  HCT 52.1* 48.3 50.5 50.5 52.1*  MCV 98.5 98.8 100.8* 101.8* 101.0*  PLT 154 138* 158 160 172   Cardiac Enzymes:  Recent Labs Lab 05/12/16 1545 05/12/16 2347 05/13/16 0333  TROPONINI 0.03* 0.03* <0.03   BNP: Invalid input(s): POCBNP CBG: No results for input(s): GLUCAP in the last 168 hours. D-Dimer No results for input(s): DDIMER in the last 72 hours. Hgb A1c No results for input(s): HGBA1C in the last 72 hours. Lipid Profile No results for  input(s): CHOL, HDL, LDLCALC, TRIG, CHOLHDL, LDLDIRECT in the last 72 hours. Thyroid function studies No results for input(s): TSH, T4TOTAL, T3FREE, THYROIDAB in the last 72 hours.  Invalid input(s): FREET3 Anemia work up No results for input(s): VITAMINB12, FOLATE, FERRITIN, TIBC, IRON, RETICCTPCT in the last 72 hours. Urinalysis    Component Value Date/Time   COLORURINE YELLOW 05/12/2016 2330   APPEARANCEUR CLEAR 05/12/2016 2330   LABSPEC >1.046 (H) 05/12/2016 2330   PHURINE 5.0 05/12/2016 2330   GLUCOSEU 50 (A) 05/12/2016 2330  HGBUR NEGATIVE 05/12/2016 2330   BILIRUBINUR NEGATIVE 05/12/2016 2330   KETONESUR 5 (A) 05/12/2016 2330   PROTEINUR 100 (A) 05/12/2016 2330   NITRITE NEGATIVE 05/12/2016 2330   LEUKOCYTESUR NEGATIVE 05/12/2016 2330   Sepsis Labs Invalid input(s): PROCALCITONIN,  WBC,  LACTICIDVEN Microbiology Recent Results (from the past 240 hour(s))  MRSA PCR Screening     Status: None   Collection Time: 05/13/16 12:52 AM  Result Value Ref Range Status   MRSA by PCR NEGATIVE NEGATIVE Final    Comment:        The GeneXpert MRSA Assay (FDA approved for NASAL specimens only), is one component of a comprehensive MRSA colonization surveillance program. It is not intended to diagnose MRSA infection nor to guide or monitor treatment for MRSA infections.      Time coordinating discharge: Over 30 minutes  SIGNED:   Clint Lipps, MD  [facility] 05/17/2016, 4:52 PM Pager   If 7PM-7AM, please contact night-coverage [email or web address] Password [email or web address]

## 2016-05-20 ENCOUNTER — Inpatient Hospital Stay: Payer: Self-pay | Admitting: Internal Medicine

## 2016-06-18 ENCOUNTER — Ambulatory Visit (INDEPENDENT_AMBULATORY_CARE_PROVIDER_SITE_OTHER): Payer: Self-pay | Admitting: Internal Medicine

## 2016-06-18 ENCOUNTER — Encounter: Payer: Self-pay | Admitting: Internal Medicine

## 2016-06-18 VITALS — BP 110/60 | HR 60 | Resp 16 | Ht 72.0 in | Wt 332.4 lb

## 2016-06-18 DIAGNOSIS — J9612 Chronic respiratory failure with hypercapnia: Secondary | ICD-10-CM

## 2016-06-18 DIAGNOSIS — J449 Chronic obstructive pulmonary disease, unspecified: Secondary | ICD-10-CM

## 2016-06-18 DIAGNOSIS — J9611 Chronic respiratory failure with hypoxia: Secondary | ICD-10-CM

## 2016-06-18 DIAGNOSIS — I2699 Other pulmonary embolism without acute cor pulmonale: Secondary | ICD-10-CM

## 2016-06-18 MED ORDER — INDACATEROL-GLYCOPYRROLATE 27.5-15.6 MCG IN CAPS: 1.0000 | ORAL_CAPSULE | Freq: Two times a day (BID) | RESPIRATORY_TRACT | 0 refills | Status: DC

## 2016-06-18 NOTE — Assessment & Plan Note (Addendum)
HCO3  11/21/15  =  32 11/22/2015 Patient Saturations on Room Air at Rest = 89%                  Room Air while Ambulating = 83%/  2 Liters of oxygen while Ambulating = 96% - ONO 01/02/16 RA  02 sat < 89% x 6h 44 min > rec 2lpm and repeat ono on 2lpm 01/03/2016 >> did not do as cannot afford 02  - HCO3   05/17/16   = 38   Now has 02 at home but not needed at rest at this point > sats in low 90s the target here

## 2016-06-18 NOTE — Assessment & Plan Note (Addendum)
Quit smoking 08/2015 Spirometry 11/22/2015  FEV1 0.91 (22%)  Ratio 45  - Trial of utibron one bid 12/26/2015 > improved   Does ok when has access to care > samples given x 4 weeks and referred back to [facility]  If not established, return here in 4 weeks

## 2016-06-18 NOTE — Assessment & Plan Note (Signed)
Body mass index is 45.08 kg/m.  -  trending up further  Lab Results  Component Value Date   TSH 4.308 08/13/2015     Contributing to gerd risk/ doe/reviewed the need and the process to achieve and maintain neg calorie balance > defer f/u primary care including intermittently monitoring thyroid status  2

## 2016-06-18 NOTE — Patient Instructions (Addendum)
You need to go ahead and file with social security for disability asap  Call the phone number for the [facility] for follow up up and meds - if they assign you a doctor then follow up here can be as needed   Please schedule a follow up office visit in 4 weeks to see Tammy NP, sooner if needed  with all medications /inhalers/ solutions in hand so we can verify exactly what you are taking. This includes all medications from all doctors and over the counters

## 2016-06-18 NOTE — Progress Notes (Signed)
Subjective:    Patient ID: Kirk ReedyRobert Lucas, male    DOB: 10-14-1960,     MRN: [id number]    Brief patient profile:  56 yowm quit smoking  08/2015 good athlete in school at wt 190  with onset in 30's of doe at wt 235 much worse 2017 assoc with leg swelling much worse 2 weeks pta with wheezing/coughing    Admit date: 08/13/2015 Discharge date: 08/20/2015  Recommendations for Outpatient Follow-up:  Continue metoprolol and lasix on discharge as prescribed. Follow up with cardio per scheduled appointment.  Discharge Diagnoses:  Principal Problem:   Respiratory failure with hypoxia (HCC) Active Problems:   Acute right-sided CHF (congestive heart failure) (HCC)   CHF- etiology not yet determined   Morbid obesity -BMI 46   Smoker   Sleep apnea by history   Pulmonary hypertension-PA 52 mmHg by echo   NSVT (nonsustained ventricular tachycardia) (HCC)   Family history of coronary artery disease   Hypoxia    Discharge Condition: stable   Diet recommendation: as tolerated   History of present illness:  56 year old male with past medical history of COPD, hypertension, smoker who [facility] with increasing weight and edema for past 2 weeks prior to this admission. On admission, BNP was 340, CXR showed no edema or consolidation. CT chest showed no pulmonary embolism but there is right lung opacity which based on patient's history of smoking will need follow-up in 12 months. Since admission patient had several runs on NSWCT. Seen by cardiology in consultation.    Hospital Course:  Assessment & Plan:  Acute right sided CHF with cor pulmonale / Possible obesity hypoventilation syndrome / acute respiratory failure with hypoxia - Stable respiratory status - Weight in past 72 hours: 146.3 kg --> 11.67 kg --> 143.79 kg --> 142.9 kg - Per cardio, lasix 40 mg daily - Continue metoprolol 25 mg BID on discharge  - Potassium and renal  function are within normal limits  Nonsustained ventricular tachycardia - Asymptomatic  Essential hypertension - Continue metoprolol 25 mg twice daily  Morbid obesity due to excess calories - Body mass index is 42.98 kg/(m^2). - Counseled on diet    DVT prophylaxis: Lovenox suBQ  Code Status: full code  Family Communication: no family at the bedside this am    Consultants:   Cardiology  Procedures:   2 D ECHO 08/14/2015 - EF 55%      11/22/2015 1st [facility] office visit/ Wert  Re GOLD IV COPD/ MO Chief Complaint  Patient presents with  . Advice Only    Referred by Dr. Elease HashimotoNahser for hypoxia, pulm htn.  pt c/o increased SOB Xseveral years.   presenlty sob x 50 ft walking slow pace and not using any inhalers or 02  rec Wear 02 2lpm as much as you can at 2lpm  Plan A = Automatic =   Anoro 1 click each am,  take two deep drags to make sure you get it all  Plan B = Backup Only use your albuterol    12/26/2015  f/u ov/Wert re:  GOLD IV/ MO/ OHS   Chief Complaint  Patient presents with  . Follow-up    Breathing has improved on Anoro and he has not had to use albuterol.    never got the 02 delivered. 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 Follow-up    Discuss ONO results. Pt states breathing has improved on Utibron. No new co's today.   off utibron x one week and can def tell the difference with more doe Cannot afford 02 as has no insurance > referred for disability  rec Apply for disability   05/07/2016  f/u ov/Wert re:  GOLD IV/ MO/ no funds for meds/ 02  Chief Complaint  Patient presents with  . Follow-up    Pt c/o increased SOB and  prod cough with green sputum for the past several wks.  Sats on RA at rest today are 74% and this increased to 90% 4lpm o2.    has not gone to social security office yet  Does better while on utibron then runs out and gradually worse x sev weeks  rec zpak  Continue utibron as your are I will check to see if we can get you oxygen thru [facility] You need to file for disability now with the social security office    Admit date: 05/12/2016 Discharge date: 05/17/2016  Admitted From: Home Disposition: Home  Recommendations for Outpatient Follow-up:  1. Follow up with PCP in 1-2 weeks 2. Please obtain BMP/CBC in one week  Home Health: NA Equipment/Devices: DME oxygen  Discharge Condition: Stable CODE STATUS: Full Code Diet recommendation: Diet 2 gram sodium Room service appropriate? Yes; Fluid consistency: Thin; Fluid restriction: 1500 mL Fluid Diet - low sodium heart healthy  Brief/Interim Summary: Kirk Lucas a 56 y.o.malewith a past medical history significant for COPD with chronic hypoxic and hypercarbic respiratory failure can't afford home O2, HFpEF, MO/OSA not on CPAPwho presents with 1 week SOB.  The patient was in his usual state of health until a few weeks ago when he started to have swelling in his right leg) he has chronic left leg swelling, but not usually right leg). Then about one week ago, he started to have constant aching pain in his right leg, moderate in intensity, and the calf and behind the knee, in addition to redness and worsening swelling. Around that time he also started to notice shortness of breath with even the slightest exertion, without chest pain, orthopnea, PND, wheezing, sputum, or cough. He had no hemoptysis, no recent surgery, no recent immobility. He had no previous PE. He has had no weight loss, no known cancer.  Discharge Diagnoses:  Principal Problem:   Bilateral pulmonary embolism (HCC) Active Problems:   Chronic diastolic CHF  (congestive heart failure) (HCC)   Morbid (severe) obesity due to excess calories (HCC)   Chronic respiratory failure with hypoxia and hypercapnia (HCC)    PE and DVT: -Bilateral PE, submassive with evidence of right heart strain. -2-D echo ordered, showed LVEF of 60-65% with severe LVH -Troponin is negative at 0.03 -PCCM , consulted, no indication for thrombolysis, hemodynamically stable. -Patient was on heparin drip, switched to Eliquis for PE/DVT treatment. (Was on LD-ASA, discontinued) -Literature reviewed, although for patient >120 kg DOACs benefits over Coumadin is unclear, probably it's easier for the patient to be on Eliquis as he will have difficulty to check his INRs. -Discharged on Eliquis, follow-up with PCP and pulmonology. -After D/C patient requested pain medications, tramadol 25 mg 20 pills prescribed, per RN  Kirk Lucas patient left the premises but he will call him to come back in the morning to pick the prescription.  Acute respiratory failure with hypoxia -Required 6 L of oxygen through high flow nasal cannula while is in the hospital. -Seen by PCCM, recommended oxygen on discharge. -Has history of possible obesity hypoventilation syndrome, follows with Dr. Sherene Sires as outpatient. -Per CM no home oxygen will be provided by [facility] as charity, desaturation to screen done. -Still desaturate on 6 L of oxygen with ambulation, discharged on 6 L of oxygen.  Acute on chronic diastolic CHF -Although his BNP is 112 he has lower extremity edema, started on Lasix. -His 2-D echo showed LVEF of 60-65% with severe LVH. -Has moderate to severe elevation of pulmonary artery pressure (likely secondary to recent PE) -Diuresed with net 11.1 L of negative fluid balance on discharge, Wt from 329 down to 307 lbs on discharge. -Discharged on 40 mg of Lasix twice a day and metoprolol. Evaluate for ACEI as outpatient.  COPD: Inactive. -Continue home inhaler Utibron  HTN -Continue current  medications.      06/18/2016  f/u ov/Wert re: post hos f/u for PE with RV strain, copd IV. Severe obesity with hypercarbia  Chief Complaint  Patient presents with  . Follow-up    Pt states that his breathing is slightly improved. Pt notes that congestion is better. Still using Utibron -- seems to be working well.     Body mass index is 45.08 kg/m.    Very confused with details of care, referred to community wellness but canceled it for reasons not clear to me and the issues re disability were not addressed with CM during admit and he has not funds for meds   No obvious day to day or daytime variability or assoc excess/ purulent sputum or mucus plugs or hemoptysis or cp or chest tightness, subjective wheeze or overt sinus or hb symptoms. No unusual exp hx or h/o childhood pna/ asthma or knowledge of premature birth.  Sleeping ok without nocturnal  or early am exacerbation  of respiratory  c/o's or need for noct saba. Also denies any obvious fluctuation of symptoms with weather or environmental changes or other aggravating or alleviating factors except as outlined above   Current Medications, Allergies, Complete Past Medical History, Past Surgical History, Family History, and Social History were reviewed in [employer] record.  ROS  The following are not active complaints unless bolded sore throat, dysphagia, dental problems, itching, sneezing,  nasal congestion or excess/ purulent secretions, ear ache,   fever, chills, sweats, unintended wt loss, classically pleuritic or exertional cp,  orthopnea pnd or leg swelling, presyncope, palpitations, abdominal pain, anorexia, nausea, vomiting, diarrhea  or change in bowel or bladder habits, change in stools or urine, dysuria,hematuria,  rash, arthralgias, visual complaints, headache, numbness, weakness or ataxia or problems with walking or coordination,  change in mood/affect or memory.                   Objective:    Physical Exam  Obese amb wm nad   06/18/2016         332  Vs d/c wt of 307 05/07/2016           329  02/21/2016       330  12/26/2015        322  11/22/15 (!) 307 lb 9.6 oz (139.5 kg)  11/21/15 (!) 305 lb 12.8 oz (138.7 kg)  08/28/15 (!) 306  lb 12.8 oz (139.2 kg)    Vital signs reviewed  - Note on arrival 02 sats  90 % on RA     HEENT: nl dentition, turbinates, and oropharynx. Nl external ear canals without cough reflex   NECK :  without JVD/Nodes/TM/ nl carotid upstrokes bilaterally   LUNGS: no acc muscle use,  Nl contour chest with very distant bs/ faint bilateral late exp wheeze worse with fvc/ better with plm   CV:  RRR  no s3 or murmur or increase in P2,  1+ ptting edema both lower ext with venous stasis changes also   ABD:  Massively obese but soft and nontender with nl inspiratory excursion in the supine position. No bruits or organomegaly, bowel sounds nl  MS:  Nl gait/ ext warm without deformities, calf tenderness, cyanosis or clubbing No obvious joint restrictions   SKIN: warm and dry without lesions  / chronic venous changes both legs   NEURO:  alert, approp, nl sensorium with  no motor deficits     I personally reviewed images and agree with radiology impression as follows:  CTa Chest  05/12/16   Positive for acute PE right greater than left with CT evidence of right heart strain (RV/LV Ratio = 1)  Stable 5 mm nodule in the right upper lobe. New left basilar atelectasis.       Assessment & Plan:

## 2016-06-18 NOTE — Assessment & Plan Note (Signed)
CTa 05/12/16 Pos with RV strain  - Venous dopplers 05/12/16 Deep venous thrombosis within the right profunda femoral, superficial femoral and popliteal veins as well as a small focus within the greater saphenous vein.  Based on morbid obesity rec indef DOAC but change to coumadin due to wt for any recurrence on doac

## 2016-06-29 ENCOUNTER — Other Ambulatory Visit: Payer: Self-pay | Admitting: Internal Medicine

## 2016-06-29 DIAGNOSIS — R911 Solitary pulmonary nodule: Secondary | ICD-10-CM

## 2016-06-30 ENCOUNTER — Encounter: Payer: Self-pay | Admitting: *Deleted

## 2016-07-17 ENCOUNTER — Encounter: Payer: Self-pay | Admitting: Adult Health

## 2016-08-04 ENCOUNTER — Ambulatory Visit: Payer: Self-pay | Admitting: Adult Health

## 2016-08-04 ENCOUNTER — Encounter: Payer: Self-pay | Admitting: Adult Health

## 2016-08-04 DIAGNOSIS — J9612 Chronic respiratory failure with hypercapnia: Secondary | ICD-10-CM

## 2016-08-04 DIAGNOSIS — I2699 Other pulmonary embolism without acute cor pulmonale: Secondary | ICD-10-CM

## 2016-08-04 DIAGNOSIS — J449 Chronic obstructive pulmonary disease, unspecified: Secondary | ICD-10-CM

## 2016-08-04 DIAGNOSIS — J9611 Chronic respiratory failure with hypoxia: Secondary | ICD-10-CM

## 2016-08-04 MED ORDER — APIXABAN 5 MG PO TABS: 5.0000 mg | ORAL_TABLET | Freq: Two times a day (BID) | ORAL | 4 refills | Status: DC

## 2016-08-04 MED ORDER — AMOXICILLIN 500 MG PO CAPS: 500.0000 mg | ORAL_CAPSULE | Freq: Three times a day (TID) | ORAL | 0 refills | Status: DC

## 2016-08-04 NOTE — Assessment & Plan Note (Signed)
Improved symptom control on Utibron .  Samples given  Will need pt assistance papers done .

## 2016-08-04 NOTE — Assessment & Plan Note (Signed)
Dx PE /DVT in 04/2016 . Patient did take anticoagulation with Eliquis for 4 weeks. Patient needs to restart this. Samples were provided to patient alone. Patient assistance program. Patient's been referred to community health and wellness. If unable to get approved for this will need to look at possible Coumadin therapy

## 2016-08-04 NOTE — Patient Instructions (Addendum)
Refer to [facility].  Please start Disability process as soon as possible.  Amoxicillin 500mg  Three times a day  For 7 days  Mucinex DM Twice daily  As needed  Cough /congestion  Pt assistance papers for Eliquis and Utibron as discussed.  Go to [facility] to pick up Eliquis samples .  Follow up with Dr. Sherene SiresWert  In 4 weeks and As needed

## 2016-08-04 NOTE — Progress Notes (Signed)
@Patient  ID: Kirk Lucas, male    DOB: 21-May-1960, 56 y.o.   MRN: [id number]  Chief Complaint  Patient presents with  . Follow-up    COPD     Referring provider: No ref. provider found  HPI: 56 year old male former smoker followed for GOLD IV COPD and chronic hypercarbic and hypoxic respiratory failure on home O2. Patient does have obstructive sleep apnea and obesity hypoventilation syndrome, but unable to afford C Pap PE /DVT, diagnosed March 2018 Patient has cor pulmonale /Pulmonary HTN ? Secondary to OHS/OSA    TEST  08/14/15 Echo>EF 55-60%, PAP 52, RV mod dil, RA severe dilated. Spirometry 11/22/2015  FEV1 0.91 (22%)  Ratio 45  HCO3  11/21/15  =  32 CTa 05/12/16 Pos with RV strain  - Venous dopplers 05/12/16 Deep venous thrombosis within the right profunda femoral, superficial femoral and popliteal veins as well as a small focus within the greater saphenous vein.   08/04/2016 Follow up : COPD, PE /DVT , O2 RF  Patient returns for a 6 week follow-up. Patient has known very severe COPD. Patient says that Annia Belt has really helped but can not afford. Patient does not have any insurance. He has not applied for disability or any type of assistance.. We discussed referral to community health and wellness which he was referred previously, but canceled the appointment. We discussed how important is to keep this up. Also, discussed starting the disability process.. Pt was given samples   Patient has a history of pulmonary embolism and DVT diagnosed in March 2018. Patient was started on Eliquis. Patient took this for approximately 4 weeks. But was unable to afford this. And thus has not taken it for the last 6 weeks. We discussed the dangers of PE and DVT. We have referred patient to community health and wellness. We filled out paperwork for patient assistance program. We discussed with the Coumadin clinic and they have samples and/or placing them for patient to pick them up.   Patient  complains of a cough over the last 2 weeks with congestion, thick mucus that is becoming yellow. He denies any fever, hemoptysis. Patient says he is eating with no nausea, vomiting, diarrhea. He denies any chest pain, hemoptysis, orthopnea, PND or leg swelling..    Allergies  Allergen Reactions  . Codeine Nausea Only  . Duricef [Cefadroxil] Itching    Immunization History  Administered Date(s) Administered  . Influenza,inj,Quad PF,36+ Mos 12/26/2015    Past Medical History:  Diagnosis Date  . CHF (congestive heart failure) (HCC) June 2017   normal LVF by echo  . COPD (chronic obstructive pulmonary disease) (HCC)   . Hypertension   . Morbid obesity (HCC)    BMI 46  . Sleep apnea    by history  . Smoker     Tobacco History: History  Smoking Status  . Former Smoker  . Packs/day: 1.25  . Years: 38.00  . Types: Cigarettes  . Quit date: 09/21/2015  Smokeless Tobacco  . Never Used   Counseling given: Not Answered   Outpatient Encounter Prescriptions as of 08/04/2016  Medication Sig  . apixaban (ELIQUIS) 5 MG TABS tablet Take 1 tablet (5 mg total) by mouth 2 (two) times daily.  . calcium carbonate (TUMS EX) 750 MG chewable tablet Chew 2 tablets by mouth as needed for heartburn.  . furosemide (LASIX) 40 MG tablet Take 1 tablet (40 mg total) by mouth 2 (two) times daily.  . hydrocortisone cream 1 % Apply 1 application topically  as needed (for rosacea).  . metoprolol tartrate (LOPRESSOR) 25 MG tablet Take 1 tablet (25 mg total) by mouth 2 (two) times daily.  . traMADol (ULTRAM) 50 MG tablet Take 0.5 tablets (25 mg total) by mouth every 6 (six) hours as needed for moderate pain.  . [DISCONTINUED] apixaban (ELIQUIS) 5 MG TABS tablet Take 2 tabs BID for 7 days, then 1 tab BID  . [DISCONTINUED] apixaban (ELIQUIS) 5 MG TABS tablet Take 1 tablet (5 mg total) by mouth 2 (two) times daily. Take 2 tabs BID for 7 days, then 1 tab BID  . [DISCONTINUED] Indacaterol-Glycopyrrolate (UTIBRON  NEOHALER) 27.5-15.6 MCG CAPS Place 1 capsule into inhaler and inhale 2 (two) times daily.  . [DISCONTINUED] Indacaterol-Glycopyrrolate (UTIBRON NEOHALER) 27.5-15.6 MCG CAPS Place 1 puff into inhaler and inhale 2 (two) times daily.  Marland Kitchen. amoxicillin (AMOXIL) 500 MG capsule Take 1 capsule (500 mg total) by mouth 3 (three) times daily.   No facility-administered encounter medications on file as of 08/04/2016.      Review of Systems  Constitutional:   No  weight loss, night sweats,  Fevers, chills,  +fatigue, or  lassitude.  HEENT:   No headaches,  Difficulty swallowing,  Tooth/dental problems, or  Sore throat,                No sneezing, itching, ear ache, nasal congestion, post nasal drip,   CV:  No chest pain,  Orthopnea, PND, swelling in lower extremities, anasarca, dizziness, palpitations, syncope.   GI  No heartburn, indigestion, abdominal pain, nausea, vomiting, diarrhea, change in bowel habits, loss of appetite, bloody stools.   Resp:  No chest wall deformity  Skin: no rash or lesions.  GU: no dysuria, change in color of urine, no urgency or frequency.  No flank pain, no hematuria   MS:  No joint pain or swelling.  No decreased range of motion.  No back pain.    Physical Exam  BP 128/84 (BP Location: Left Arm, Cuff Size: Large)   Pulse (!) 56   SpO2 92%   GEN: A/Ox3; pleasant , NAD, obese    HEENT:  [place]/AT,  EACs-clear, TMs-wnl, NOSE-clear, THROAT-clear, no lesions, no postnasal drip or exudate noted.   NECK:  Supple w/ fair ROM; no JVD; normal carotid impulses w/o bruits; no thyromegaly or nodules palpated; no lymphadenopathy.    RESP  Decreased BS , no accessory muscle use, no dullness to percussion  CARD:  RRR, no m/r/g, tr  peripheral edema, pulses intact, no cyanosis or clubbing.  GI:   Soft & nt; nml bowel sounds; no organomegaly or masses detected.   Musco: Warm bil, no deformities or joint swelling noted.   Neuro: alert, no focal deficits noted.    Skin:  Warm, no lesions or rashes    Lab Results:  CBC  BNP  ProBNP No results found for: PROBNP  Imaging: No results found.   Assessment & Plan:   Bilateral pulmonary embolism (HCC) Dx PE /DVT in 04/2016 . Patient did take anticoagulation with Eliquis for 4 weeks. Patient needs to restart this. Samples were provided to patient alone. Patient assistance program. Patient's been referred to community health and wellness. If unable to get approved for this will need to look at possible Coumadin therapy  Chronic respiratory failure with hypoxia and hypercapnia (HCC) Continue on home oxygen  COPD GOLD IV  Improved symptom control on Utibron .  Samples given  Will need pt assistance papers done .  Rubye Oaks, NP 08/04/2016

## 2016-08-04 NOTE — Assessment & Plan Note (Signed)
Continue on home oxygen

## 2016-08-04 NOTE — Progress Notes (Signed)
Chart and office note reviewed in detail  > agree with a/p as outlined    

## 2016-08-15 ENCOUNTER — Other Ambulatory Visit: Payer: Self-pay | Admitting: Cardiology

## 2016-08-17 ENCOUNTER — Ambulatory Visit (INDEPENDENT_AMBULATORY_CARE_PROVIDER_SITE_OTHER)
Admission: RE | Admit: 2016-08-17 | Discharge: 2016-08-17 | Disposition: A | Discharge status: Home / Self Care | Payer: Self-pay | Source: Ambulatory Visit | Attending: Internal Medicine | Admitting: Internal Medicine

## 2016-08-17 ENCOUNTER — Telehealth: Payer: Self-pay | Admitting: Adult Health

## 2016-08-17 DIAGNOSIS — R911 Solitary pulmonary nodule: Secondary | ICD-10-CM

## 2016-08-17 NOTE — Telephone Encounter (Signed)
Spoke with pt. Made him aware that Shanda BumpsJessica received his patient assistance forms for Eliquis. Pt states that he in fact does not have any income at all. 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

## 2016-08-18 NOTE — Progress Notes (Signed)
Spoke with pt and notified of results per Dr. Wert. Pt verbalized understanding and denied any questions. 

## 2016-09-04 NOTE — Telephone Encounter (Signed)
Patient Assistance forms were faxed for patient Pt was informed at last office visit that approval can take up to 1 month Will keep forms in my to-do folder in case there is an issue Will sign off on phone as patient will be notified of approval/denial rather than the office

## 2016-09-18 ENCOUNTER — Ambulatory Visit: Payer: Self-pay | Admitting: Internal Medicine

## 2016-10-22 ENCOUNTER — Encounter: Payer: Self-pay | Admitting: Internal Medicine

## 2016-10-22 ENCOUNTER — Ambulatory Visit (INDEPENDENT_AMBULATORY_CARE_PROVIDER_SITE_OTHER): Payer: Self-pay | Admitting: Internal Medicine

## 2016-10-22 VITALS — BP 104/60 | HR 58 | Ht 72.0 in | Wt 320.0 lb

## 2016-10-22 DIAGNOSIS — J9611 Chronic respiratory failure with hypoxia: Secondary | ICD-10-CM

## 2016-10-22 DIAGNOSIS — J9612 Chronic respiratory failure with hypercapnia: Secondary | ICD-10-CM

## 2016-10-22 DIAGNOSIS — I2699 Other pulmonary embolism without acute cor pulmonale: Secondary | ICD-10-CM

## 2016-10-22 DIAGNOSIS — J449 Chronic obstructive pulmonary disease, unspecified: Secondary | ICD-10-CM

## 2016-10-22 MED ORDER — INDACATEROL-GLYCOPYRROLATE 27.5-15.6 MCG IN CAPS: 1.0000 | ORAL_CAPSULE | Freq: Two times a day (BID) | RESPIRATORY_TRACT | 2 refills | Status: DC

## 2016-10-22 NOTE — Assessment & Plan Note (Signed)
CTa 05/12/16 Pos with RV strain  - Venous dopplers 05/12/16 Deep venous thrombosis within the right profunda femoral, superficial femoral and popliteal veins as well as a small focus within the greater saphenous vein.  Based on morbid obesity rec indef DOAC > says access to eliquis and starting back on it 10/22/2016

## 2016-10-22 NOTE — Assessment & Plan Note (Signed)
Quit smoking 08/2015 Spirometry 11/22/2015  FEV1 0.91 (22%)  Ratio 45  - Trial of utibron one bid 12/26/2015 > improved   Improves with access to lama/laba typical of pt  Group B in terms of symptom/risk and laba/lama therefore appropriate rx at this point.   Give samples, refer back to community health

## 2016-10-22 NOTE — Progress Notes (Signed)
Subjective:    Patient ID: Kirk Lucas, male    DOB: Oct 24, 1960,     MRN: [id number]    Brief patient profile:  56 yowm quit smoking  08/2015 good athlete in school at wt 190  with onset in 30's of doe at wt 235 much worse 2017 assoc with leg swelling much worse 2 weeks pta with wheezing/coughing    Admit date: 08/13/2015 Discharge date: 08/20/2015    Discharge Diagnoses:  Principal Problem:   Respiratory failure with hypoxia (HCC) Active Problems:   Acute right-sided CHF (congestive heart failure) (HCC)   CHF- etiology not yet determined   Morbid obesity -BMI 46   Smoker   Sleep apnea by history   Pulmonary hypertension-PA 52 mmHg by echo   NSVT (nonsustained ventricular tachycardia) (HCC)   Family history of coronary artery disease   Hypoxia    11/22/2015 1st [facility] office visit/ Donte Kary  Re GOLD IV COPD/ MO Chief Complaint  Patient presents with  . Advice Only    Referred by Dr. Elease Hashimoto for hypoxia, pulm htn.  pt c/o increased SOB Xseveral years.   presenlty sob x 50 ft walking slow pace and not using any inhalers or 02  rec Wear 02 2lpm as much as you can at 2lpm  Plan A = Automatic =   Anoro 1 click each am,  take two deep drags to make sure you get it all  Plan B = Backup Only use your albuterol    12/26/2015  f/u ov/Nariah Morgano re:  GOLD IV/ MO/ OHS   Chief Complaint  Patient presents with  . Follow-up    Breathing has improved on Anoro and he has not had to use albuterol.    never got the 02 delivered. 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 -2-D echo ordered, showed LVEF of 60-65% with severe LVH -Troponin is negative at 0.03 -PCCM , consulted, no indication for thrombolysis, hemodynamically stable. -Patient was on heparin drip, switched to Eliquis for PE/DVT treatment. (Was on LD-ASA, discontinued) -Literature reviewed, although for patient >120 kg DOACs benefits over Coumadin is unclear, probably it's easier for the patient to be on Eliquis as he will have difficulty to check his INRs. -Discharged on Eliquis, follow-up with PCP and pulmonology. -After D/C patient requested pain medications, tramadol 25 mg 20 pills prescribed, per RN Mr. Fayrene Fearing patient left the premises but he will call him to come back in the morning to pick the prescription.  Acute respiratory failure with hypoxia -Required 6 L of oxygen through high flow nasal cannula while is in the hospital. -Seen by PCCM, recommended oxygen on discharge. -Has history of possible obesity hypoventilation syndrome, follows with Dr. Sherene Sires as outpatient. -Per CM no home oxygen will be provided by [facility] as charity, desaturation to screen done. -  Still desaturate on 6 L of oxygen with ambulation, discharged on 6 L of oxygen.  Acute on chronic diastolic CHF -Although his BNP is 112 he has lower extremity edema, started on Lasix. -His 2-D echo showed LVEF of 60-65% with severe LVH. -Has moderate to severe elevation of pulmonary artery pressure (likely secondary to recent PE) -Diuresed with net 11.1 L of negative fluid balance on discharge, Wt from 329 down to 307 lbs on discharge. -Discharged on 40 mg of Lasix twice a day and metoprolol. Evaluate for ACEI as outpatient.  COPD: Inactive. -Continue home inhaler Utibron  HTN -Continue current  medications.      06/18/2016  f/u ov/Debara Kamphuis re: post hos f/u for PE with RV strain, copd IV. Severe obesity with hypercarbia  Chief Complaint  Patient presents with  . Follow-up    Pt states that his breathing is slightly improved. Pt notes that congestion is better. Still using Utibron -- seems to be working well.     Very confused with details of care, referred to community wellness but canceled it for reasons not clear to me and the issues re disability were not addressed with CM during admit and he has not funds for meds rec You need to go ahead and file with social security for disability asap Call the phone number for the community health center for follow up up and meds - if they assign you a doctor then follow up here can be as needed   08/17/16  Refer to [employer].  Please start Disability process as soon as possible.  Amoxicillin 500mg  Three times a day  For 7 days  Mucinex DM Twice daily  As needed  Cough /congestion  Pt assistance papers for Eliquis and Utibron as discussed     10/22/2016  f/u ov/Shaida Route re:  GOLD IV / 02 dep hs  Chief Complaint  Patient presents with  . Follow-up    Breathing is unchanged. He stopped Eliquis 4 months back since he is unable to afford med. He has noticed swelling in his rt foot ever since.    off utibron x sev days  Wakes up in recliner upright on 6lpm / wakes up rested / min am congestion / occ ha  Walking x 20 min around neighboorhood s stopping slow pace   No obvious day to day or daytime variability or assoc excess/ purulent sputum or mucus plugs or hemoptysis or cp or chest tightness, subjective wheeze or overt sinus or hb symptoms. No unusual exp hx or h/o childhood pna/ asthma or knowledge of premature birth.  Sleeping ok without nocturnal  or early am exacerbation  of respiratory  c/o's or need for noct saba. Also denies any obvious fluctuation of symptoms with weather or environmental changes or other aggravating or  alleviating factors except as outlined above   Current Medications, Allergies, Complete Past Medical History, Past Surgical History, Family History, and Social History were reviewed in [employer] record.  ROS  The following are not active complaints unless bolded sore throat, dysphagia, dental problems, itching, sneezing,  nasal congestion or excess/ purulent secretions, ear ache,   fever, chills, sweats, unintended wt loss, classically pleuritic or exertional cp,  orthopnea pnd or leg swelling, presyncope, palpitations, abdominal pain, anorexia, nausea, vomiting, diarrhea  or change in bowel or bladder habits, change in stools or urine, dysuria,hematuria,  rash, arthralgias, visual complaints, headache, numbness, weakness or ataxia or problems with walking or coordination,  change in mood/affect or  memory.            Objective:   Physical Exam  Obese amb wm nad - very evasive with details of care/ meds   10/22/2016         320 06/18/2016         332  Vs d/c wt of 307 05/07/2016           329  02/21/2016       330  12/26/2015        322  11/22/15 (!) 307 lb 9.6 oz (139.5 kg)  11/21/15 (!) 305 lb 12.8 oz (138.7 kg)  08/28/15 (!) 306 lb 12.8 oz (139.2 kg)    Vital signs reviewed  - Note on arrival 02 sats  92 % on RA     HEENT: nl dentition, turbinates, and oropharynx. Nl external ear canals without cough reflex   NECK :  without JVD/Nodes/TM/ nl carotid upstrokes bilaterally   LUNGS: no acc muscle use,  Nl contour chest with very distant bs bilaterally  CV:  RRR  no s3 or murmur or increase in P2,  1+ ptting edema both lower ext with venous stasis changes also bilaterally   ABD:  Massively obese but soft and nontender with nl inspiratory excursion in the supine position. No bruits or organomegaly, bowel sounds nl  MS:  Nl gait/ ext warm without deformities, calf tenderness, cyanosis or clubbing No obvious joint restrictions   SKIN: warm and dry without  lesions  / chronic venous changes both legs   NEURO:  alert, approp, nl sensorium with  no motor deficits            Assessment & Plan:

## 2016-10-22 NOTE — Assessment & Plan Note (Signed)
Body mass index is 43.4 kg/m.  -  Trending down/ encouraged Lab Results  Component Value Date   TSH 4.308 08/13/2015     Contributing to gerd risk/ doe/reviewed the need and the process to achieve and maintain neg calorie balance > defer f/u primary care including intermittently monitoring thyroid status

## 2016-10-22 NOTE — Patient Instructions (Signed)
If am headache try the next night to cut it down by 2lpm and see what effect this has the next  Am   Call to establish at [facility] wellness center - if they decline to see you ask for the person's name so I can troubleshoot why  Please schedule a follow up visit in 3 months but call sooner if needed  To See Tammy NP

## 2016-10-22 NOTE — Assessment & Plan Note (Signed)
HCO3  11/21/15  =  32 11/22/2015 Patient Saturations on Room Air at Rest = 89%                  Room Air while Ambulating = 83%/  2 Liters of oxygen while Ambulating = 96% - ONO 01/02/16 RA  02 sat < 89% x 6h 44 min > rec 2lpm and repeat ono on 2lpm 01/03/2016 >> did not do as cannot afford 02  - HCO3   05/17/16   = 38   rec try reduce 02 hs from 6 to 4 to see if less HA since may be more hypercarbic hs and need better hypoxemic drive to prevent noct exac of hypercarbia leading to increased ICP

## 2016-11-05 ENCOUNTER — Ambulatory Visit: Payer: Self-pay | Admitting: Internal Medicine

## 2016-11-19 ENCOUNTER — Telehealth: Payer: Self-pay | Admitting: Internal Medicine

## 2016-11-19 MED ORDER — INDACATEROL-GLYCOPYRROLATE 27.5-15.6 MCG IN CAPS: 1.0000 | ORAL_CAPSULE | Freq: Two times a day (BID) | RESPIRATORY_TRACT | 3 refills | Status: DC

## 2016-11-19 NOTE — Telephone Encounter (Signed)
4 samples up front for pick up  Spoke with the pt and notified of this  Nothing further needed

## 2016-11-26 ENCOUNTER — Telehealth: Payer: Self-pay | Admitting: Internal Medicine

## 2016-11-26 NOTE — Telephone Encounter (Signed)
Spoke with pt, advised pt I did not see a message from [place] but will route to her to make sure it wasn't something that was not in his chart. I do not see any notes in Epic from labs, procedures, or telephone notes.

## 2016-11-27 NOTE — Telephone Encounter (Signed)
Spoke with Shanda Bumps regarding a message from the patient returning her call. Checked in Epic in labs, procedures and telephone notes do not see where we have left message for return call at this time.  LVM for patient to return call to make sure if there was anything that was needed at this time.

## 2016-12-24 ENCOUNTER — Telehealth: Payer: Self-pay | Admitting: Internal Medicine

## 2016-12-24 NOTE — Telephone Encounter (Signed)
Pt returned call and was informed that medication left for him.-tr

## 2016-12-24 NOTE — Telephone Encounter (Signed)
Will close encounter, as nothing further is needed.  

## 2016-12-24 NOTE — Telephone Encounter (Signed)
Left message for pt to call back. Left samples up front to pick up. Let pt know if he calls back.

## 2017-01-18 ENCOUNTER — Encounter: Payer: Self-pay | Admitting: Adult Health

## 2017-01-18 ENCOUNTER — Ambulatory Visit (INDEPENDENT_AMBULATORY_CARE_PROVIDER_SITE_OTHER): Payer: Self-pay | Admitting: Adult Health

## 2017-01-18 ENCOUNTER — Telehealth: Payer: Self-pay | Admitting: Adult Health

## 2017-01-18 ENCOUNTER — Other Ambulatory Visit (INDEPENDENT_AMBULATORY_CARE_PROVIDER_SITE_OTHER): Payer: Self-pay

## 2017-01-18 VITALS — BP 130/78 | HR 57 | Ht 72.5 in | Wt 325.8 lb

## 2017-01-18 DIAGNOSIS — I5032 Chronic diastolic (congestive) heart failure: Secondary | ICD-10-CM

## 2017-01-18 DIAGNOSIS — I2699 Other pulmonary embolism without acute cor pulmonale: Secondary | ICD-10-CM

## 2017-01-18 DIAGNOSIS — J9611 Chronic respiratory failure with hypoxia: Secondary | ICD-10-CM

## 2017-01-18 DIAGNOSIS — L98499 Non-pressure chronic ulcer of skin of other sites with unspecified severity: Secondary | ICD-10-CM

## 2017-01-18 DIAGNOSIS — J9612 Chronic respiratory failure with hypercapnia: Secondary | ICD-10-CM

## 2017-01-18 DIAGNOSIS — Z23 Encounter for immunization: Secondary | ICD-10-CM

## 2017-01-18 DIAGNOSIS — J449 Chronic obstructive pulmonary disease, unspecified: Secondary | ICD-10-CM

## 2017-01-18 LAB — CBC WITH DIFFERENTIAL/PLATELET
BASOS PCT: 1 % (ref 0.0–3.0)
Basophils Absolute: 0.1 10*3/uL (ref 0.0–0.1)
EOS PCT: 1.8 % (ref 0.0–5.0)
Eosinophils Absolute: 0.2 10*3/uL (ref 0.0–0.7)
HCT: 50.6 % (ref 39.0–52.0)
Hemoglobin: 16.6 g/dL (ref 13.0–17.0)
LYMPHS ABS: 2.1 10*3/uL (ref 0.7–4.0)
Lymphocytes Relative: 24 % (ref 12.0–46.0)
MCHC: 32.9 g/dL (ref 30.0–36.0)
MCV: 99.3 fl (ref 78.0–100.0)
MONO ABS: 0.7 10*3/uL (ref 0.1–1.0)
MONOS PCT: 8.3 % (ref 3.0–12.0)
NEUTROS PCT: 64.9 % (ref 43.0–77.0)
Neutro Abs: 5.6 10*3/uL (ref 1.4–7.7)
Platelets: 170 10*3/uL (ref 150.0–400.0)
RBC: 5.1 Mil/uL (ref 4.22–5.81)
RDW: 14.6 % (ref 11.5–15.5)
WBC: 8.6 10*3/uL (ref 4.0–10.5)

## 2017-01-18 LAB — BASIC METABOLIC PANEL
BUN: 16 mg/dL (ref 6–23)
CHLORIDE: 102 meq/L (ref 96–112)
CO2: 36 meq/L — AB (ref 19–32)
Calcium: 9.8 mg/dL (ref 8.4–10.5)
Creatinine, Ser: 0.92 mg/dL (ref 0.40–1.50)
GFR: 90.9 mL/min (ref >60.00)
GLUCOSE: 125 mg/dL — AB (ref 70–99)
POTASSIUM: 4.6 meq/L (ref 3.5–5.1)
SODIUM: 142 meq/L (ref 135–145)

## 2017-01-18 MED ORDER — INDACATEROL-GLYCOPYRROLATE 27.5-15.6 MCG IN CAPS: 1.0000 | ORAL_CAPSULE | Freq: Two times a day (BID) | RESPIRATORY_TRACT | 11 refills | Status: DC

## 2017-01-18 MED ORDER — DOXYCYCLINE HYCLATE 100 MG PO TABS: 100.0000 mg | ORAL_TABLET | Freq: Two times a day (BID) | ORAL | 0 refills | Status: DC

## 2017-01-18 MED ORDER — INDACATEROL-GLYCOPYRROLATE 27.5-15.6 MCG IN CAPS: 1.0000 | ORAL_CAPSULE | Freq: Two times a day (BID) | RESPIRATORY_TRACT | 0 refills | Status: DC

## 2017-01-18 NOTE — Assessment & Plan Note (Signed)
Very severe COPD - controlled on Utibron  Check alpha 1

## 2017-01-18 NOTE — Assessment & Plan Note (Signed)
Appears compensated on lasix  Check bmet today  Keep with cardiology follow up  Mostly likely will need echo follow up since had PE earlier this year.

## 2017-01-18 NOTE — Telephone Encounter (Signed)
lmtcb X1 for pt to make aware of recs.   

## 2017-01-18 NOTE — Telephone Encounter (Signed)
Spoke with pt, advised him recommendations from TP. He stated he already has Amoxicillin. I verified if he had 500mg  tablets and he said yes. I also verified if he had 30 pills to take one tablet three times a day. Pt agreed and nothing further is needed.

## 2017-01-18 NOTE — Telephone Encounter (Signed)
Pt states that he was prescribed doxycycline at today's OV with TP.  States that he has 56 tabs of Amoxicillin 500mg  at home, wants to know if he can use this instead.    TP please advise.  Thanks.

## 2017-01-18 NOTE — Progress Notes (Signed)
Chart and office note reviewed in detail  > agree with a/p as outlined    

## 2017-01-18 NOTE — Assessment & Plan Note (Signed)
Wt loss  

## 2017-01-18 NOTE — Assessment & Plan Note (Signed)
Cont on O2 4l/m At bedtime  .  And o2 2l/m with act as needed. Goal to keep sats >88-90%.

## 2017-01-18 NOTE — Patient Instructions (Addendum)
Folllow up with Cardiology .  Doxycycline 100mg  Twice daily  For 1 week .  Wash leg sore and pat dry , cover .  Flu shot .  Lab today .  Continue on Utibron 1 puff Twice daily.  Continue on Eliquis Twice daily   Continue on Oxygen with activity and At bedtime  .  [facility] to [facility]  Follow up in 2 weeks and .As needed  Parrett  Follow up with Dr. Sherene SiresWert  In 3 months and As needed   Please contact office for sooner follow up if symptoms do not improve or worsen or seek emergency care

## 2017-01-18 NOTE — Assessment & Plan Note (Signed)
Venous stasis ulcer ? Surround cellultis  Wound care daily  Doxycycline x 1 week .  follow up in 2 weeks and As needed   Please contact office for sooner follow up if symptoms do not improve or worsen or seek emergency care

## 2017-01-18 NOTE — Progress Notes (Signed)
@Patient  ID: Kirk Lucas, male    DOB: April 15, 1960, 56 y.o.   MRN: [id number]  Chief Complaint  Patient presents with  . Follow-up    COPD     Referring provider: No ref. provider found  HPI: 56 year old male former smoker followed for GOLD IV COPD , OHS/OSA (unable to afford CPAP ) , Hypoxic/Hypercarbic  RF , PE (dx 04/2016) .   PMH -CHF   TEST  HCO3  11/21/15  =  32 11/22/2015 Patient Saturations on Room Air at Rest = 89%                  Room Air while Ambulating = 83%/  2 Liters of oxygen while Ambulating = 96% - ONO 01/02/16 RA  02 sat < 89% x 6h 44 min > rec 2lpm and repeat ono on 2lpm 01/03/2016 >> did not do as cannot afford 02  - HCO3   05/17/16   = 38  CTa 05/12/16 Pos with RV strain  - Venous dopplers 05/12/16 Deep venous thrombosis within the right profunda femoral, superficial femoral and popliteal veins as well as a small focus ithin the greater saphenous vein. >REC Lifelong anticoagulation  Spirometry 11/22/2015  FEV1 0.91 (22%)  Ratio 45   01/18/2017 Follow up : COPD-GOLD IV  , O2 RF, PE /DVT, OHS Pt returns for 3 month follow up . Says overall breathing is doing okay . He has very severe COPD . Marland KitchenRemains on Utibron . He says it really helps. Depends on samples. . No flare of cough or wheezing . Gets winded with heavy activity and prolonged walking.   He has Suspected OSA/OHS , split night pending. Missed appointment. He can not afford CPAP.    Has D CHF,Cor Pulmonale and Pulmonary HTN  . On lasix 40 Twice daily-mostly takes daly .   Marland Kitchen Followed by cardiology but has not been seen for ~1 year. Says wt goes up and down .   Dx with PE /DVT in 04/2016 . He is on Eliquis .  Recommended  for life long therapy . Says tolerating, denies any known bleeding .   Last ov , O2 decreased to 4l/m At bedtime  . Early morning headache are some better. .  Uses O2 during daytime As needed.   Pt has no insurance . He has started the Disability process.  Eliquis was approved through  patient assistance program.  Depends on Utibron samples. Paperwork started for assistance program.  Has not set up with [employer] and wellness . Plans to see them next week.   Pt says legs have chronic swelling . Skin is dry . Took a knife and scratched dry skin couple of weeks ago, now has a Right leg small ulcer . Some drainage. No redness or fever.          Allergies  Allergen Reactions  . Codeine Nausea Only  . Duricef [Cefadroxil] Itching    Immunization History  Administered Date(s) Administered  . Influenza,inj,Quad PF,6+ Mos 12/26/2015    Past Medical History:  Diagnosis Date  . CHF (congestive heart failure) (HCC) June 2017   normal LVF by echo  . COPD (chronic obstructive pulmonary disease) (HCC)   . Hypertension   . Morbid obesity (HCC)    BMI 46  . Sleep apnea    by history  . Smoker     Tobacco History: Social History   Tobacco Use  Smoking Status Former Smoker  . Packs/day: 1.25  . Years:  38.00  . Pack years: 47.50  . Types: Cigarettes  . Last attempt to quit: 09/21/2015  . Years since quitting: 1.3  Smokeless Tobacco Never Used   Counseling given: Not Answered   Outpatient Encounter Medications as of 01/18/2017  Medication Sig  . apixaban (ELIQUIS) 5 MG TABS tablet Take 5 mg 2 (two) times daily by mouth.  . calcium carbonate (TUMS EX) 750 MG chewable tablet Chew 2 tablets by mouth as needed for heartburn.  . furosemide (LASIX) 40 MG tablet Take 1 tablet (40 mg total) by mouth 2 (two) times daily.  . hydrocortisone cream 1 % Apply 1 application topically as needed (for rosacea).  . Indacaterol-Glycopyrrolate (UTIBRON NEOHALER) 27.5-15.6 MCG CAPS Place 1 capsule into inhaler and inhale 2 (two) times daily.  . metoprolol tartrate (LOPRESSOR) 25 MG tablet TAKE ONE TABLET BY MOUTH TWICE DAILY  . doxycycline (VIBRA-TABS) 100 MG tablet Take 1 tablet (100 mg total) 2 (two) times daily by mouth.  . Indacaterol-Glycopyrrolate (UTIBRON NEOHALER)  27.5-15.6 MCG CAPS Place 1 puff 2 (two) times daily into inhaler and inhale.  . Indacaterol-Glycopyrrolate (UTIBRON NEOHALER) 27.5-15.6 MCG CAPS Place 1 puff 2 (two) times daily into inhaler and inhale.  . [DISCONTINUED] Indacaterol-Glycopyrrolate (UTIBRON NEOHALER) 27.5-15.6 MCG CAPS Place 1 puff 2 times daily at 12 noon and 4 pm into inhaler and inhale.   No facility-administered encounter medications on file as of 01/18/2017.      Review of Systems  Constitutional:   No  weight loss, night sweats,  Fevers, chills,  +fatigue, or  lassitude.  HEENT:   No headaches,  Difficulty swallowing,  Tooth/dental problems, or  Sore throat,                No sneezing, itching, ear ache, nasal congestion, post nasal drip,   CV:  No chest pain,  Orthopnea, PND,  anasarca, dizziness, palpitations, syncope.   GI  No heartburn, indigestion, abdominal pain, nausea, vomiting, diarrhea, change in bowel habits, loss of appetite, bloody stools.   Resp:    No chest wall deformity  Skin: no rash or lesions.  GU: no dysuria, change in color of urine, no urgency or frequency.  No flank pain, no hematuria   MS:  No joint pain or swelling.  No decreased range of motion.  No back pain.    Physical Exam  BP 130/78   Pulse (!) 57   Ht 6' 0.5" (1.842 m)   Wt (!) 325 lb 12.8 oz (147.8 kg)   SpO2 94%   BMI 43.58 kg/m   GEN: A/Ox3; pleasant , NAD, obese    HEENT:  [place]/AT,  EACs-clear, TMs-wnl, NOSE-clear, THROAT-clear, no lesions, no postnasal drip or exudate noted.   NECK:  Supple w/ fair ROM; no JVD; normal carotid impulses w/o bruits; no thyromegaly or nodules palpated; no lymphadenopathy.    RESP  Clear  P & A; w/o, wheezes/ rales/ or rhonchi. no accessory muscle use, no dullness to percussion  CARD:  RRR, no m/r/g, 1+ peripheral edema, pulses intact, no cyanosis or clubbing.  GI:   Soft & nt; nml bowel sounds; no organomegaly or masses detected.   Musco: Warm bil, no deformities or joint  swelling noted.   Neuro: alert, no focal deficits noted.    Skin: Warm, along right lateral /posterior calf small circular ulcer with scant drainage.  Stasis deramatic changes along LE.     Lab Results:  CBC    Component Value Date/Time   WBC 11.6 (  H) 05/16/2016 0432   RBC 5.16 05/16/2016 0432   HGB 16.7 05/16/2016 0432   HCT 52.1 (H) 05/16/2016 0432   PLT 172 05/16/2016 0432   MCV 101.0 (H) 05/16/2016 0432   MCH 32.4 05/16/2016 0432   MCHC 32.1 05/16/2016 0432   RDW 14.3 05/16/2016 0432   LYMPHSABS 1.8 08/13/2015 1750   MONOABS 0.8 08/13/2015 1750   EOSABS 0.1 08/13/2015 1750   BASOSABS 0.0 08/13/2015 1750    BMET    Component Value Date/Time   NA 134 (L) 05/17/2016 0252   K 5.0 05/17/2016 0252   CL 90 (L) 05/17/2016 0252   CO2 38 (H) 05/17/2016 0252   GLUCOSE 125 (H) 05/17/2016 0252   BUN 20 05/17/2016 0252   CREATININE 1.13 05/17/2016 0252   CREATININE 1.05 11/21/2015 0953   CALCIUM 9.1 05/17/2016 0252   GFRNONAA >60 05/17/2016 0252   GFRAA >60 05/17/2016 0252    BNP    Component Value Date/Time   BNP 111.9 (H) 05/12/2016 1545    ProBNP No results found for: PROBNP  Imaging: No results found.   Assessment & Plan:   Chronic diastolic CHF (congestive heart failure) (HCC) Appears compensated on lasix  Check bmet today  Keep with cardiology follow up  Mostly likely will need echo follow up since had PE earlier this year.    Bilateral pulmonary embolism (HCC) PE/DVT dx in 04/2016 - with obesity and multiple comorbidities , pulmonary HTN  Cont on Eliquis -lifelong.  Check cbc   Chronic respiratory failure with hypoxia and hypercapnia (HCC) Cont on O2 4l/m At bedtime  .  And o2 2l/m with act as needed. Goal to keep sats >88-90%.   COPD GOLD IV  Very severe COPD - controlled on Utibron  Check alpha 1   Morbid obesity due to excess calories (HCC) Wt loss   Skin ulcer (HCC) Venous stasis ulcer ? Surround cellultis  Wound care daily    Doxycycline x 1 week .  follow up in 2 weeks and As needed   Please contact office for sooner follow up if symptoms do not improve or worsen or seek emergency care       Rubye Oaksammy Parrett, NP 01/18/2017

## 2017-01-18 NOTE — Assessment & Plan Note (Signed)
PE/DVT dx in 04/2016 - with obesity and multiple comorbidities , pulmonary HTN  Cont on Eliquis -lifelong.  Check cbc

## 2017-01-18 NOTE — Telephone Encounter (Signed)
Per TP: let's call the pharmacy to see how much the Doxycycline will cost and then we'll go from there  [facility] >> Doxycycline tabs not in stock, will be in tomorrow and cost w/out insurance is $47.06.  Per TP: if he can't afford, okay for the Amoxicillin 500mg  that he has at home TID x10days, take w/ food, keep follow up on 12/7.  Thank you.

## 2017-01-20 ENCOUNTER — Ambulatory Visit: Payer: Self-pay | Admitting: Cardiovascular Disease

## 2017-01-25 LAB — ALPHA-1 ANTITRYPSIN PHENOTYPE: A1 ANTITRYPSIN SER: 174 mg/dL (ref 83–199)

## 2017-02-05 ENCOUNTER — Ambulatory Visit (INDEPENDENT_AMBULATORY_CARE_PROVIDER_SITE_OTHER): Payer: Self-pay | Admitting: Adult Health

## 2017-02-05 ENCOUNTER — Encounter: Payer: Self-pay | Admitting: Adult Health

## 2017-02-05 DIAGNOSIS — J9611 Chronic respiratory failure with hypoxia: Secondary | ICD-10-CM

## 2017-02-05 DIAGNOSIS — J449 Chronic obstructive pulmonary disease, unspecified: Secondary | ICD-10-CM

## 2017-02-05 DIAGNOSIS — L98499 Non-pressure chronic ulcer of skin of other sites with unspecified severity: Secondary | ICD-10-CM

## 2017-02-05 DIAGNOSIS — I2699 Other pulmonary embolism without acute cor pulmonale: Secondary | ICD-10-CM

## 2017-02-05 DIAGNOSIS — J9612 Chronic respiratory failure with hypercapnia: Secondary | ICD-10-CM

## 2017-02-05 DIAGNOSIS — I5032 Chronic diastolic (congestive) heart failure: Secondary | ICD-10-CM

## 2017-02-05 MED ORDER — INDACATEROL-GLYCOPYRROLATE 27.5-15.6 MCG IN CAPS: 1.0000 | ORAL_CAPSULE | Freq: Two times a day (BID) | RESPIRATORY_TRACT | 0 refills | Status: DC

## 2017-02-05 NOTE — Progress Notes (Signed)
 @Patient  ID: Kirk Lucas, male    DOB: 08/01/1960, 56 y.o.   MRN: [id number]  Chief Complaint  Patient presents with  . Follow-up    MW pt; Pt states he does not see any improvement in breathing since seen last    Referring provider: No ref. provider found  HPI: 56 year old male former smoker followed for GOLD IV COPD , OHS/OSA (unable to afford CPAP ) , Hypoxic/Hypercarbic  RF , PE (dx 04/2016) .   PMH -CHF   TEST  HCO3 11/21/15 = 32 11/22/2015 Patient Saturations on Room Air at Rest = 89% Room Air while Ambulating = 83%/ 2Liters of oxygen while Ambulating = 96% - ONO 01/02/16 RA 02 sat <89% x 6h 44 min >rec 2lpm and repeat ono on 2lpm 01/03/2016 >>did not do as cannot afford 02  - HCO3 05/17/16 = 38  CTa 05/12/16 Pos with RV strain  - Venous dopplers 05/12/16 Deep venous thrombosis within the right profunda femoral, superficial femoral and popliteal veins as well as a small focus ithin the greater saphenous vein. >REC Lifelong anticoagulation  Spirometry 11/22/2015 FEV1 0.91 (22%) Ratio 45   02/05/2017 Follow up: COPD -GOLD IV , O2 RF , PE/DVTR/OHS/Cellulitis  Patient returns for a 2-week follow-up.  Last visit patient had a right lower extremity skin lesion that looked like he had a secondary infection with cellulitis.  He was started on doxycycline.  Patient does not have any insurance and was unable to afford this.  He did have amoxicillin at home.  And he has taken that for 10 days.  Patient says area has improved with decreased redness and is healing.  He denies any fever or purulent drainage..  Patient has very severe COPD.  He remains on Utibron. Samples were given today .  Patient assistance program paperwork has been completed and awaiting approval. He has been referred to community health and wellness to establish for patient assistance and medical care.  He is awaiting appointment.. Feels breathing is doing about the same.  He denies increased  cough.  Does get short of breath with minimal activity.  He has history of DVT and PE diagnosed in March 2018.  He remains on Eliquis.  He has been approved through the patient assistance program. Denies any known bleeding.Marland Kitchen.  He remains on oxygen at bedtime.  Uses oxygen during the daytime as needed.      Allergies  Allergen Reactions  . Codeine Nausea Only  . Duricef [Cefadroxil] Itching    Immunization History  Administered Date(s) Administered  . Influenza,inj,Quad PF,6+ Mos 12/26/2015, 01/18/2017    Past Medical History:  Diagnosis Date  . CHF (congestive heart failure) (HCC) June 2017   normal LVF by echo  . COPD (chronic obstructive pulmonary disease) (HCC)   . Hypertension   . Morbid obesity (HCC)    BMI 46  . Sleep apnea    by history  . Smoker     Tobacco History: Social History   Tobacco Use  Smoking Status Former Smoker  . Packs/day: 1.25  . Years: 38.00  . Pack years: 47.50  . Types: Cigarettes  . Last attempt to quit: 09/21/2015  . Years since quitting: 1.3  Smokeless Tobacco Never Used   Counseling given: Not Answered   Outpatient Encounter Medications as of 02/05/2017  Medication Sig  . apixaban (ELIQUIS) 5 MG TABS tablet Take 5 mg 2 (two) times daily by mouth.  . calcium carbonate (TUMS EX) 750 MG chewable tablet Chew 2  tablets by mouth as needed for heartburn.  . furosemide (LASIX) 40 MG tablet Take 1 tablet (40 mg total) by mouth 2 (two) times daily.  . hydrocortisone cream 1 % Apply 1 application topically as needed (for rosacea).  . Indacaterol-Glycopyrrolate (UTIBRON NEOHALER) 27.5-15.6 MCG CAPS Place 1 puff 2 (two) times daily into inhaler and inhale.  . metoprolol tartrate (LOPRESSOR) 25 MG tablet TAKE ONE TABLET BY MOUTH TWICE DAILY  . Indacaterol-Glycopyrrolate (UTIBRON NEOHALER) 27.5-15.6 MCG CAPS Place 1 puff into inhaler and inhale 2 (two) times daily.  . [DISCONTINUED] doxycycline (VIBRA-TABS) 100 MG tablet Take 1 tablet (100 mg  total) 2 (two) times daily by mouth. (Patient not taking: Reported on 02/05/2017)  . [DISCONTINUED] Indacaterol-Glycopyrrolate (UTIBRON NEOHALER) 27.5-15.6 MCG CAPS Place 1 capsule into inhaler and inhale 2 (two) times daily.  . [DISCONTINUED] Indacaterol-Glycopyrrolate (UTIBRON NEOHALER) 27.5-15.6 MCG CAPS Place 1 puff 2 (two) times daily into inhaler and inhale.   No facility-administered encounter medications on file as of 02/05/2017.      Review of Systems  Constitutional:   No  weight loss, night sweats,  Fevers, chills,  +fatigue, or  lassitude.  HEENT:   No headaches,  Difficulty swallowing,  Tooth/dental problems, or  Sore throat,                No sneezing, itching, ear ache, nasal congestion, post nasal drip,   CV:  No chest pain,  Orthopnea, PND, v anasarca, dizziness, palpitations, syncope.   GI  No heartburn, indigestion, abdominal pain, nausea, vomiting, diarrhea, change in bowel habits, loss of appetite, bloody stools.   Resp:   No chest wall deformity    GU: no dysuria, change in color of urine, no urgency or frequency.  No flank pain, no hematuria   MS:  No joint pain or swelling.  No decreased range of motion.  No back pain.    Physical Exam  BP 132/82 (BP Location: Left Arm, Cuff Size: Normal)   Pulse (!) 57   Wt (!) 323 lb 6.4 oz (146.7 kg)   SpO2 96%   BMI 43.26 kg/m   GEN: A/Ox3; pleasant , NAD, morbidly obese   HEENT:  [place]/AT,  EACs-clear, TMs-wnl, NOSE-clear, THROAT-clear, no lesions, no postnasal drip or exudate noted.   NECK:  Supple w/ fair ROM; no JVD; normal carotid impulses w/o bruits; no thyromegaly or nodules palpated; no lymphadenopathy.    RESP decreased breath sounds in the bases  no accessory muscle use, no dullness to percussion  CARD:  RRR, no m/r/g, trace to 1+ peripheral edema, pulses intact, no cyanosis or clubbing.  GI:   Soft & nt; nml bowel sounds; no organomegaly or masses detected.   Musco: Warm bil, no deformities or joint  swelling noted.   Neuro: alert, no focal deficits noted.    Skin: Warm, the right lateral posterior calf ulcer is decreased with no significant redness.    Lab Results:  CBC  BNP  ProBNP No results found for: PROBNP  Imaging: No results found.   Assessment & Plan:   Skin ulcer (HCC) Right lower extremity skin ulcer improved with no apparent residual infection.  Patient is completed antibiotic course.  Patient is advised on continuing skin care.  Follow-up if not resolving.. Please contact office for sooner follow up if symptoms do not improve or worsen or seek emergency care    COPD GOLD IV  Stable on current regimen  No changes  Chronic respiratory failure with hypoxia and hypercapnia (  HCC) Continue on oxygen at bedtime  Bilateral pulmonary embolism (HCC) Continue on Eliquis  Chronic diastolic CHF (congestive heart failure) (HCC) Follow-up with cardiology as planned next week     Rubye Oaksammy Cevin Rubinstein, NP 02/05/2017

## 2017-02-05 NOTE — Assessment & Plan Note (Signed)
Continue on Eliquis 

## 2017-02-05 NOTE — Assessment & Plan Note (Signed)
Right lower extremity skin ulcer improved with no apparent residual infection.  Patient is completed antibiotic course.  Patient is advised on continuing skin care.  Follow-up if not resolving.. Please contact office for sooner follow up if symptoms do not improve or worsen or seek emergency care

## 2017-02-05 NOTE — Assessment & Plan Note (Signed)
Continue on oxygen at bedtime 

## 2017-02-05 NOTE — Progress Notes (Signed)
Chart and office note reviewed in detail  > agree with a/p as outlined    

## 2017-02-05 NOTE — Patient Instructions (Addendum)
Folllow up with Cardiology as planned next week.  Wash leg sore and pat dry , cover until completely healed.  Continue on Utibron 1 puff Twice daily.  Continue on Eliquis Twice daily   Continue on Oxygen with activity and At bedtime  .  [facility] as discussed .  Follow up with Dr. Sherene SiresWert  In 2-3  months and As needed   Please contact office for sooner follow up if symptoms do not improve or worsen or seek emergency care

## 2017-02-05 NOTE — Assessment & Plan Note (Signed)
Follow-up with cardiology as planned next week

## 2017-02-05 NOTE — Assessment & Plan Note (Signed)
Stable on current regimen  No changes  

## 2017-02-09 ENCOUNTER — Ambulatory Visit: Payer: Self-pay | Admitting: Cardiovascular Disease

## 2017-02-16 ENCOUNTER — Telehealth: Payer: Self-pay | Admitting: Adult Health

## 2017-02-16 NOTE — Telephone Encounter (Signed)
At last ov w/ TP, patient assistance was initiated for Utibron Forms were brought back to the office by pt's sister and faxed to [employer] on 12.13.18  Received fax from [employer] yesterday 12/17: "Please provide a 4506-T as verification of non-filing.  Please fax to the program at [phone]."  The patient assistance form itself contains more information: "If the patient has not filed a Hospital doctorederal Tax Return, visit www.IRS.gov to request a free Verification of Non-Filing..Use Form 4506-Tand check box 7 to request verification of non-filing."   Called spoke with patient to discuss - form printed for patient and mailed to his verified home address as requested by patient.  Pt aware to return form and we can fax back for him.    Will sign off but keep message in my box to follow up.

## 2017-02-21 ENCOUNTER — Encounter (HOSPITAL_BASED_OUTPATIENT_CLINIC_OR_DEPARTMENT_OTHER): Payer: Self-pay | Admitting: Emergency Medicine

## 2017-02-21 ENCOUNTER — Other Ambulatory Visit: Payer: Self-pay

## 2017-02-21 ENCOUNTER — Emergency Department (HOSPITAL_BASED_OUTPATIENT_CLINIC_OR_DEPARTMENT_OTHER)
Admission: EM | Admit: 2017-02-21 | Discharge: 2017-02-21 | Disposition: A | Discharge status: Home / Self Care | Payer: Self-pay | Attending: Emergency Medicine | Admitting: Emergency Medicine

## 2017-02-21 DIAGNOSIS — I5032 Chronic diastolic (congestive) heart failure: Secondary | ICD-10-CM | POA: Insufficient documentation

## 2017-02-21 DIAGNOSIS — I110 Hypertensive heart disease with heart failure: Secondary | ICD-10-CM | POA: Insufficient documentation

## 2017-02-21 DIAGNOSIS — L03115 Cellulitis of right lower limb: Secondary | ICD-10-CM | POA: Insufficient documentation

## 2017-02-21 DIAGNOSIS — Z79899 Other long term (current) drug therapy: Secondary | ICD-10-CM | POA: Insufficient documentation

## 2017-02-21 DIAGNOSIS — Z7901 Long term (current) use of anticoagulants: Secondary | ICD-10-CM | POA: Insufficient documentation

## 2017-02-21 DIAGNOSIS — J449 Chronic obstructive pulmonary disease, unspecified: Secondary | ICD-10-CM | POA: Insufficient documentation

## 2017-02-21 DIAGNOSIS — Z87891 Personal history of nicotine dependence: Secondary | ICD-10-CM | POA: Insufficient documentation

## 2017-02-21 MED ORDER — MUPIROCIN CALCIUM 2 % EX CREA: 1.0000 "application " | TOPICAL_CREAM | Freq: Two times a day (BID) | CUTANEOUS | 0 refills | Status: DC

## 2017-02-21 MED ORDER — MUPIROCIN 2 % EX OINT
TOPICAL_OINTMENT | CUTANEOUS | Status: AC
  Administered 2017-02-21: 1
  Filled 2017-02-21: qty 22

## 2017-02-21 MED ORDER — SULFAMETHOXAZOLE-TRIMETHOPRIM 800-160 MG PO TABS
1.0000 | ORAL_TABLET | Freq: Once | ORAL | Status: AC
  Administered 2017-02-21: 1 via ORAL
  Filled 2017-02-21: qty 1

## 2017-02-21 MED ORDER — MUPIROCIN CALCIUM 2 % EX CREA
TOPICAL_CREAM | Freq: Once | CUTANEOUS | Status: AC
Start: 2017-02-21 — End: 2017-02-21
  Administered 2017-02-21: 1 via TOPICAL
  Filled 2017-02-21: qty 15

## 2017-02-21 MED ORDER — SULFAMETHOXAZOLE-TRIMETHOPRIM 800-160 MG PO TABS: 1.0000 | ORAL_TABLET | Freq: Two times a day (BID) | ORAL | 0 refills | Status: AC

## 2017-02-21 NOTE — ED Provider Notes (Signed)
[facility] EMERGENCY DEPARTMENT Provider Note   CSN: [id number] Arrival date & time: 02/21/17  1135     History   Chief Complaint Chief Complaint  Patient presents with  . Wound Check    HPI Kirk Lucas is a 56 y.o. male.  Pt presents to the ED today with a nonhealing wound to his right lower leg.  The pt has noticed it for a few months.  He did take a course of amoxicillin which did not help.  He said it is getting a little bigger.  Unfortunately, the pt does not have insurance and does not have a pcp to f/u.  He did show it to his pulmonologist.  He denies any f/c.      Past Medical History:  Diagnosis Date  . CHF (congestive heart failure) (HCC) June 2017   normal LVF by echo  . COPD (chronic obstructive pulmonary disease) (HCC)   . Hypertension   . Morbid obesity (HCC)    BMI 46  . Sleep apnea    by history  . Smoker     Patient Active Problem List   Diagnosis Date Noted  . Skin ulcer (HCC) 01/18/2017  . Bilateral pulmonary embolism (HCC) 05/12/2016  . Solitary pulmonary nodule 12/30/2015  . COPD GOLD IV  11/23/2015  . Acute right-sided CHF (congestive heart failure) (HCC) 08/19/2015  . Chronic respiratory failure with hypoxia and hypercapnia (HCC)   . Morbid obesity due to excess calories (HCC) 08/15/2015  . Cigarette smoker 08/15/2015  . Sleep apnea by history 08/15/2015  . Cor pulmonale, chronic (HCC) 08/15/2015  . NSVT (nonsustained ventricular tachycardia) (HCC) 08/15/2015  . Family history of coronary artery disease 08/15/2015  . Respiratory failure with hypoxia (HCC) 08/14/2015  . Chronic diastolic CHF (congestive heart failure) (HCC) 08/13/2015    Past Surgical History:  Procedure Laterality Date  . arm surgery    . WRIST SURGERY         Home Medications    Prior to Admission medications   Medication Sig Start Date End Date Taking? Authorizing Provider  apixaban (ELIQUIS) 5 MG TABS tablet Take 5 mg 2 (two) times daily by  mouth.    [provider]  calcium carbonate (TUMS EX) 750 MG chewable tablet Chew 2 tablets by mouth as needed for heartburn.    [provider]  furosemide (LASIX) 40 MG tablet Take 1 tablet (40 mg total) by mouth 2 (two) times daily. 05/17/16   Clydia Llano, MD  hydrocortisone cream 1 % Apply 1 application topically as needed (for rosacea).    [provider]  Indacaterol-Glycopyrrolate (UTIBRON NEOHALER) 27.5-15.6 MCG CAPS Place 1 puff 2 (two) times daily into inhaler and inhale. 01/18/17   Parrett, Virgel Bouquet, NP  Indacaterol-Glycopyrrolate (UTIBRON NEOHALER) 27.5-15.6 MCG CAPS Place 1 puff into inhaler and inhale 2 (two) times daily. 02/05/17   Parrett, Virgel Bouquet, NP  metoprolol tartrate (LOPRESSOR) 25 MG tablet TAKE ONE TABLET BY MOUTH TWICE DAILY 08/17/16   Nahser, Deloris Ping, MD  mupirocin cream (BACTROBAN) 2 % Apply 1 application topically 2 (two) times daily. 02/21/17   Jacalyn Lefevre, MD  sulfamethoxazole-trimethoprim (BACTRIM DS,SEPTRA DS) 800-160 MG tablet Take 1 tablet by mouth 2 (two) times daily for 7 days. 02/21/17 02/28/17  Jacalyn Lefevre, MD    Family History Family History  Problem Relation Age of Onset  . Coronary artery disease Father 81       MI  . Congestive Heart Failure Father 42  died CHF commplications  . Pulmonary disease Father        pulmonary edema per pt  . Coronary artery disease Mother 955       MI  . Stroke Mother [id number]       died after CVA  . Diabetes type II Sister        twin sister with DM    Social History Social History   Tobacco Use  . Smoking status: Former Smoker    Packs/day: 1.25    Years: 38.00    Pack years: 47.50    Types: Cigarettes    Last attempt to quit: 09/21/2015    Years since quitting: 1.4  . Smokeless tobacco: Never Used  Substance Use Topics  . Alcohol use: No    Alcohol/week: 0.0 oz  . Drug use: No     Allergies   Codeine and Duricef [cefadroxil]   Review of Systems Review of Systems    Skin: Positive for wound.  All other systems reviewed and are negative.    Physical Exam Updated Vital Signs BP 140/75 (BP Location: Left Arm)   Pulse 64   Temp 97.8 F (36.6 C) (Oral)   Resp 20   Ht 6' (1.829 m)   Wt (!) 146.5 kg (323 lb)   SpO2 91%   BMI 43.81 kg/m   Physical Exam  Constitutional: He is oriented to person, place, and time. He appears well-developed and well-nourished.  HENT:  Head: Normocephalic and atraumatic.  Right Ear: External ear normal.  Left Ear: External ear normal.  Nose: Nose normal.  Mouth/Throat: Oropharynx is clear and moist.  Eyes: Conjunctivae and EOM are normal. Pupils are equal, round, and reactive to light.  Neck: Normal range of motion. Neck supple.  Cardiovascular: Normal rate, regular rhythm, normal heart sounds and intact distal pulses.  Pulmonary/Chest: Effort normal and breath sounds normal.  Abdominal: Soft. Bowel sounds are normal.  Musculoskeletal: Normal range of motion.  Neurological: He is alert and oriented to person, place, and time.  Skin:  See picture.  Chronic changes and cellulitis/skin ulcer  Nursing note and vitals reviewed.      ED Treatments / Results  Labs (all labs ordered are listed, but only abnormal results are displayed) Labs Reviewed - No data to display  EKG  EKG Interpretation None       Radiology No results found.  Procedures Procedures (including critical care time)  Medications Ordered in ED Medications  mupirocin cream (BACTROBAN) 2 % (not administered)  sulfamethoxazole-trimethoprim (BACTRIM DS,SEPTRA DS) 800-160 MG per tablet 1 tablet (not administered)     Initial Impression / Assessment and Plan / ED Course  I have reviewed the triage vital signs and the nursing notes.  Pertinent labs & imaging results that were available during my care of the patient were reviewed by me and considered in my medical decision making (see chart for details).    We will try mupirocin and  bactrim.  He is given his first dose here prior to d/c.  Pt likely needs wound care, but as he does not have insurance, he wants to see what the meds do as he has not been appropriately treated for staph which is likely what he has.  Pt is encouraged to establish primary care and to return if worse.  Final Clinical Impressions(s) / ED Diagnoses   Final diagnoses:  Cellulitis of right lower extremity    ED Discharge Orders        Ordered  sulfamethoxazole-trimethoprim (BACTRIM DS,SEPTRA DS) 800-160 MG tablet  2 times daily     02/21/17 1340    mupirocin cream (BACTROBAN) 2 %  2 times daily     02/21/17 1340       Jacalyn LefevreHaviland, Ladona Rosten, MD 02/21/17 1344

## 2017-02-21 NOTE — ED Triage Notes (Signed)
Patient states that he has a wound to the back of his Right leg   - the patient is on an antibiotic  - patient states that it is not healing since the NP saw him a few weeks ago.

## 2017-03-09 NOTE — Telephone Encounter (Signed)
Received fax from [employer] again asking for form 4506-T Faxed back to [employer] with note that form was mailed to patient on 12.18.18

## 2017-03-10 ENCOUNTER — Encounter (HOSPITAL_BASED_OUTPATIENT_CLINIC_OR_DEPARTMENT_OTHER): Payer: Self-pay

## 2017-03-10 ENCOUNTER — Other Ambulatory Visit: Payer: Self-pay

## 2017-03-10 ENCOUNTER — Emergency Department (HOSPITAL_BASED_OUTPATIENT_CLINIC_OR_DEPARTMENT_OTHER)
Admission: EM | Admit: 2017-03-10 | Discharge: 2017-03-10 | Disposition: A | Discharge status: Home / Self Care | Payer: Self-pay | Attending: Emergency Medicine | Admitting: Emergency Medicine

## 2017-03-10 DIAGNOSIS — I110 Hypertensive heart disease with heart failure: Secondary | ICD-10-CM | POA: Insufficient documentation

## 2017-03-10 DIAGNOSIS — Y939 Activity, unspecified: Secondary | ICD-10-CM | POA: Insufficient documentation

## 2017-03-10 DIAGNOSIS — S81801A Unspecified open wound, right lower leg, initial encounter: Secondary | ICD-10-CM | POA: Insufficient documentation

## 2017-03-10 DIAGNOSIS — Y929 Unspecified place or not applicable: Secondary | ICD-10-CM | POA: Insufficient documentation

## 2017-03-10 DIAGNOSIS — Z7901 Long term (current) use of anticoagulants: Secondary | ICD-10-CM | POA: Insufficient documentation

## 2017-03-10 DIAGNOSIS — Z87891 Personal history of nicotine dependence: Secondary | ICD-10-CM | POA: Insufficient documentation

## 2017-03-10 DIAGNOSIS — Z79899 Other long term (current) drug therapy: Secondary | ICD-10-CM | POA: Insufficient documentation

## 2017-03-10 DIAGNOSIS — I5032 Chronic diastolic (congestive) heart failure: Secondary | ICD-10-CM | POA: Insufficient documentation

## 2017-03-10 DIAGNOSIS — X58XXXA Exposure to other specified factors, initial encounter: Secondary | ICD-10-CM | POA: Insufficient documentation

## 2017-03-10 DIAGNOSIS — J449 Chronic obstructive pulmonary disease, unspecified: Secondary | ICD-10-CM | POA: Insufficient documentation

## 2017-03-10 DIAGNOSIS — Y999 Unspecified external cause status: Secondary | ICD-10-CM | POA: Insufficient documentation

## 2017-03-10 MED ORDER — DOXYCYCLINE HYCLATE 100 MG PO CAPS: 100.0000 mg | ORAL_CAPSULE | Freq: Two times a day (BID) | ORAL | 0 refills | Status: DC

## 2017-03-10 NOTE — ED Triage Notes (Signed)
Pt states he was seen and dx with cellulitis to right LE 12/23-states area is no better-NAD-steady gait

## 2017-03-10 NOTE — ED Notes (Signed)
Case management called to get an earlier appointment for the patient to get follow up for chronic wound management

## 2017-03-10 NOTE — Discharge Planning (Signed)
Mumin Denomme J. Lucretia RoersWood, RN, BSN, [place] [phone]  [facility] Julianne HandlerLachina Hollis, NP on 1/28 @ 1:00.  Spoke with pt at bedside and advised to please arrive 15 min early and take a picture ID and your current medications.  Pt verbalizes understanding of keeping appointment.

## 2017-03-10 NOTE — ED Provider Notes (Signed)
[facility] EMERGENCY DEPARTMENT Provider Note   CSN: [id number] Arrival date & time: 03/10/17  1324     History   Chief Complaint Chief Complaint  Patient presents with  . Recurrent Skin Infections    HPI Kirk Lucas is a 57 y.o. male.  HPI  57 year old male with a history of morbid obesity, COPD, CHF presents with a worsening right leg wound.  He states that this wound originally started on his right posterior lower leg about 2 months ago.  Was seen here just prior to Christmas and was placed on Bactrim.  He states his wounds seem to get a little bit better but now it is starting to get worse.  Over the last 2 days it is growing larger and seems to be weeping clear fluid.  It is also a little bit more painful.  He denies any new leg swelling.  He denies any fevers, vomiting.  He has chronic shortness of breath from CHF and COPD and states his normal oxygen saturation is around 88 or 89%.  He does not feel short of breath currently.  He has oxygen at home but typically only wears it at night.  Past Medical History:  Diagnosis Date  . CHF (congestive heart failure) (HCC) June 2017   normal LVF by echo  . COPD (chronic obstructive pulmonary disease) (HCC)   . Hypertension   . Morbid obesity (HCC)    BMI 46  . Sleep apnea    by history  . Smoker     Patient Active Problem List   Diagnosis Date Noted  . Skin ulcer (HCC) 01/18/2017  . Bilateral pulmonary embolism (HCC) 05/12/2016  . Solitary pulmonary nodule 12/30/2015  . COPD GOLD IV  11/23/2015  . Acute right-sided CHF (congestive heart failure) (HCC) 08/19/2015  . Chronic respiratory failure with hypoxia and hypercapnia (HCC)   . Morbid obesity due to excess calories (HCC) 08/15/2015  . Cigarette smoker 08/15/2015  . Sleep apnea by history 08/15/2015  . Cor pulmonale, chronic (HCC) 08/15/2015  . NSVT (nonsustained ventricular tachycardia) (HCC) 08/15/2015  . Family history of coronary artery disease  08/15/2015  . Respiratory failure with hypoxia (HCC) 08/14/2015  . Chronic diastolic CHF (congestive heart failure) (HCC) 08/13/2015    Past Surgical History:  Procedure Laterality Date  . arm surgery    . WRIST SURGERY         Home Medications    Prior to Admission medications   Medication Sig Start Date End Date Taking? Authorizing Provider  apixaban (ELIQUIS) 5 MG TABS tablet Take 5 mg 2 (two) times daily by mouth.    [provider]  calcium carbonate (TUMS EX) 750 MG chewable tablet Chew 2 tablets by mouth as needed for heartburn.    [provider]  doxycycline (VIBRAMYCIN) 100 MG capsule Take 1 capsule (100 mg total) by mouth 2 (two) times daily. One po bid x 7 days 03/10/17   Pricilla LovelessGoldston, Kodie Pick, MD  furosemide (LASIX) 40 MG tablet Take 1 tablet (40 mg total) by mouth 2 (two) times daily. 05/17/16   Clydia LlanoElmahi, Mutaz, MD  hydrocortisone cream 1 % Apply 1 application topically as needed (for rosacea).    [provider]  Indacaterol-Glycopyrrolate (UTIBRON NEOHALER) 27.5-15.6 MCG CAPS Place 1 puff 2 (two) times daily into inhaler and inhale. 01/18/17   Parrett, Virgel Bouquetammy S, NP  Indacaterol-Glycopyrrolate (UTIBRON NEOHALER) 27.5-15.6 MCG CAPS Place 1 puff into inhaler and inhale 2 (two) times daily. 02/05/17   Parrett, Babette Relicammy  S, NP  metoprolol tartrate (LOPRESSOR) 25 MG tablet TAKE ONE TABLET BY MOUTH TWICE DAILY 08/17/16   Nahser, Deloris Ping, MD  mupirocin cream (BACTROBAN) 2 % Apply 1 application topically 2 (two) times daily. 02/21/17   Jacalyn Lefevre, MD    Family History Family History  Problem Relation Age of Onset  . Coronary artery disease Father 58       MI  . Congestive Heart Failure Father 15       died CHF commplications  . Pulmonary disease Father        pulmonary edema per pt  . Coronary artery disease Mother 30       MI  . Stroke Mother 27       died after CVA  . Diabetes type II Sister        twin sister with DM    Social History Social  History   Tobacco Use  . Smoking status: Former Smoker    Packs/day: 1.25    Years: 38.00    Pack years: 47.50    Types: Cigarettes    Last attempt to quit: 09/21/2015    Years since quitting: 1.4  . Smokeless tobacco: Never Used  Substance Use Topics  . Alcohol use: No    Alcohol/week: 0.0 oz  . Drug use: No     Allergies   Codeine and Duricef [cefadroxil]   Review of Systems Review of Systems  Constitutional: Negative for fever.  Gastrointestinal: Negative for vomiting.  Skin: Positive for color change and wound.  Neurological: Negative for weakness and numbness.  All other systems reviewed and are negative.    Physical Exam Updated Vital Signs BP (!) 143/92 (BP Location: Right Arm)   Pulse 75   Temp 97.7 F (36.5 C) (Oral)   Resp 16   Ht 6' (1.829 m)   Wt 136.1 kg (300 lb)   SpO2 (!) 88% Comment: Pt states his O2 is always low  BMI 40.69 kg/m   Physical Exam  Constitutional: He is oriented to person, place, and time. He appears well-developed and well-nourished. No distress.  Morbidly obese  HENT:  Head: Normocephalic and atraumatic.  Right Ear: External ear normal.  Left Ear: External ear normal.  Nose: Nose normal.  Eyes: Right eye exhibits no discharge. Left eye exhibits no discharge.  Neck: Neck supple.  Cardiovascular: Normal rate, regular rhythm and normal heart sounds.  Pulmonary/Chest: Effort normal. He has wheezes (slight expiratory wheezes).  Abdominal: Soft.  Musculoskeletal:  Bilateral lower extremity swelling, nonpitting.  Legs are symmetrically swollen.  Mild redness to the anterior aspect of his lower legs bilaterally.  On the posterior aspect of his lower right calf is a wound.  See below picture.  It is very superficial, mildly tender.  Neurological: He is alert and oriented to person, place, and time.  Skin: Skin is warm and dry. He is not diaphoretic.  Nursing note and vitals reviewed.        ED Treatments / Results   Labs (all labs ordered are listed, but only abnormal results are displayed) Labs Reviewed - No data to display  EKG  EKG Interpretation None       Radiology No results found.  Procedures Procedures (including critical care time)  Medications Ordered in ED Medications - No data to display   Initial Impression / Assessment and Plan / ED Course  I have reviewed the triage vital signs and the nursing notes.  Pertinent labs & imaging results that  were available during my care of the patient were reviewed by me and considered in my medical decision making (see chart for details).     My suspicion is, he has poor wound healing due to his morbid obesity, lymphedema, and CHF.  However given that the wound seems to be enlarging over the last couple days this could have an infectious component.  I will give him doxycycline to broadly cover as he has a cephalosporin allergy.  He is not febrile, having vomiting, or in distress.  His vitals are unremarkable besides SPO2 of 88% and above.  He states this is normal for him and he is in no distress.  He declines workup for shortness of breath and states his shortness of breath is chronic and stable.  Declines albuterol treatment.  He appears stable for discharge home and case management has been involved to help arrange PCP follow-up later this month.  Return precautions.  Final Clinical Impressions(s) / ED Diagnoses   Final diagnoses:  Leg wound, right, initial encounter    ED Discharge Orders        Ordered    doxycycline (VIBRAMYCIN) 100 MG capsule  2 times daily     03/10/17 1532       Pricilla Loveless, MD 03/10/17 1536

## 2017-03-29 ENCOUNTER — Encounter: Payer: Self-pay | Admitting: Family Medicine

## 2017-03-29 ENCOUNTER — Ambulatory Visit (INDEPENDENT_AMBULATORY_CARE_PROVIDER_SITE_OTHER): Payer: Self-pay | Admitting: Family Medicine

## 2017-03-29 DIAGNOSIS — M79604 Pain in right leg: Secondary | ICD-10-CM

## 2017-03-29 DIAGNOSIS — L0390 Cellulitis, unspecified: Secondary | ICD-10-CM | POA: Insufficient documentation

## 2017-03-29 DIAGNOSIS — Z131 Encounter for screening for diabetes mellitus: Secondary | ICD-10-CM

## 2017-03-29 DIAGNOSIS — L03818 Cellulitis of other sites: Secondary | ICD-10-CM

## 2017-03-29 DIAGNOSIS — Z1159 Encounter for screening for other viral diseases: Secondary | ICD-10-CM

## 2017-03-29 DIAGNOSIS — I890 Lymphedema, not elsewhere classified: Secondary | ICD-10-CM

## 2017-03-29 DIAGNOSIS — L97911 Non-pressure chronic ulcer of unspecified part of right lower leg limited to breakdown of skin: Secondary | ICD-10-CM

## 2017-03-29 DIAGNOSIS — J449 Chronic obstructive pulmonary disease, unspecified: Secondary | ICD-10-CM

## 2017-03-29 DIAGNOSIS — Z86711 Personal history of pulmonary embolism: Secondary | ICD-10-CM

## 2017-03-29 DIAGNOSIS — B372 Candidiasis of skin and nail: Secondary | ICD-10-CM

## 2017-03-29 LAB — POCT GLYCOSYLATED HEMOGLOBIN (HGB A1C): HEMOGLOBIN A1C: 5.6

## 2017-03-29 MED ORDER — SULFAMETHOXAZOLE-TRIMETHOPRIM 800-160 MG PO TABS: 1.0000 | ORAL_TABLET | Freq: Two times a day (BID) | ORAL | 0 refills | Status: AC

## 2017-03-29 MED ORDER — TRAMADOL HCL 50 MG PO TABS: 50.0000 mg | ORAL_TABLET | Freq: Four times a day (QID) | ORAL | 0 refills | Status: DC | PRN

## 2017-03-29 MED ORDER — FLUCONAZOLE 150 MG PO TABS: 150.0000 mg | ORAL_TABLET | ORAL | 0 refills | Status: DC

## 2017-03-29 MED ORDER — KETOROLAC TROMETHAMINE 30 MG/ML IJ SOLN
30.0000 mg | Freq: Once | INTRAMUSCULAR | Status: AC
  Administered 2017-03-29: 30 mg via INTRAMUSCULAR

## 2017-03-29 NOTE — Patient Instructions (Signed)
For left leg cellulitis, will start Bactrim 800-1 60 twice daily for 14 days.  We will also send a referral to wound care for further workup and evaluation.  Sent a wound culture, will follow-up as results become available.  Apply dressings as needed.  I suspect the ongoing infection to left lower extremity.  Will start Diflucan 150 mg weekly for 4 weeks.  Referral to vein and vascular for lymphedema  Please follow-up with cardiology as scheduled Please follow up in pulmonology as scheduled  Follow-up with any abnormal laboratory results   Cellulitis, Adult Cellulitis is a skin infection. The infected area is usually red and sore. This condition occurs most often in the arms and lower legs. It is very important to get treated for this condition. Follow these instructions at home:  Take over-the-counter and prescription medicines only as told by your doctor.  If you were prescribed an antibiotic medicine, take it as told by your doctor. Do not stop taking the antibiotic even if you start to feel better.  Drink enough fluid to keep your pee (urine) clear or pale yellow.  Do not touch or rub the infected area.  Raise (elevate) the infected area above the level of your heart while you are sitting or lying down.  Place warm or cold wet cloths (warm or cold compresses) on the infected area. Do this as told by your doctor.  Keep all follow-up visits as told by your doctor. This is important. These visits let your doctor make sure your infection is not getting worse. Contact a doctor if:  You have a fever.  Your symptoms do not get better after 1-2 days of treatment.  Your bone or joint under the infected area starts to hurt after the skin has healed.  Your infection comes back. This can happen in the same area or another area.  You have a swollen bump in the infected area.  You have new symptoms.  You feel ill and also have muscle aches and pains. Get help right away if:  Your  symptoms get worse.  You feel very sleepy.  You throw up (vomit) or have watery poop (diarrhea) for a long time.  There are red streaks coming from the infected area.  Your red area gets larger.  Your red area turns darker. This information is not intended to replace advice given to you by your health care provider. Make sure you discuss any questions you have with your health care provider. Document Released: 08/05/2007 Document Revised: 07/25/2015 Document Reviewed: 12/26/2014 Elsevier Interactive Patient Education  2018 [employer].  Lymphedema Lymphedema is swelling that is caused by the abnormal collection of lymph under the skin. Lymph is fluid from the tissues in your body that travels in the lymphatic system. This system is part of the immune system and includes lymph nodes and lymph vessels. The lymph vessels collect and carry the excess fluid, fats, proteins, and wastes from the tissues of the body to the bloodstream. This system also works to clean and remove bacteria and waste products from the body. Lymphedema occurs when the lymphatic system is blocked. When the lymph vessels or lymph nodes are blocked or damaged, lymph does not drain properly, causing an abnormal buildup of lymph. This leads to swelling in the arms or legs. Lymphedema cannot be cured by medicines, but various methods can be used to help reduce the swelling. What are the causes? There are two types of lymphedema. Primary lymphedema is caused by the absence  or abnormality of the lymph vessel at birth. Secondary lymphedema is more common. It occurs when the lymph vessel is damaged or blocked. Common causes of lymph vessel blockage include:  Skin infection, such as cellulitis.  Infection by parasites (filariasis).  Injury.  Cancer.  Radiation therapy.  Formation of scar tissue.  Surgery.  What are the signs or symptoms? Symptoms of this condition include:  Swelling of the arm or leg.  A heavy or  tight feeling in the arm or leg.  Swelling of the feet, toes, or fingers. Shoes or rings may fit more tightly than before.  Redness of the skin over the affected area.  Limited movement of the affected limb.  Sensitivity to touch or discomfort in the affected limb.  How is this diagnosed? This condition may be diagnosed with:  A physical exam.  Medical history.  Bioimpedance spectroscopy. In this test, painless electrical currents are used to measure fluid levels in your body.  Imaging tests, such as: ? Lymphoscintigraphy. In this test, a low dose of a radioactive substance is injected to trace the flow of lymph through the lymph vessels. ? MRI. ? CT scan. ? Duplex ultrasound. This test uses sound waves to produce images of the vessels and the blood flow on a screen. ? Lymphangiography. In this test, a contrast dye is injected into the lymph vessel to help show blockages.  How is this treated? Treatment for this condition may depend on the cause. Treatment may include:  Exercise. Certain exercises can help fluid move out of the affected limb.  Massage. Gentle massage of the affected limb can help move the fluid out of the area.  Compression. Various methods may be used to apply pressure to the affected limb in order to reduce the swelling. ? Wearing compression stockings or sleeves on the affected limb. ? Bandaging the affected limb. ? Using an external pump that is attached to a sleeve that alternates between applying pressure and releasing pressure.  Surgery. This is usually only done for severe cases. For example, surgery may be done if you have trouble moving the limb or if the swelling does not get better with other treatments.  If an underlying condition is causing the lymphedema, treatment for that condition is needed. For example, antibiotic medicines may be used to treat an infection. Follow these instructions at home: Activities  Exercise regularly as directed by  your health care provider.  Do not sit with your legs crossed.  When possible, keep the affected limb raised (elevated) above the level of your heart.  Avoid carrying things with an arm that is affected by lymphedema.  Remember that the affected area is more likely to become injured or infected.  Take these steps to help prevent infection: ? Keep the affected area clean and dry. ? Protect your skin from cuts. For example, you should use gloves while cooking or gardening. Do not walk barefoot. If you shave the affected area, use an Neurosurgeon. General instructions  Take medicines only as directed by your health care provider.  Eat a healthy diet that includes a lot of fruits and vegetables.  Do not wear tight clothes, shoes, or jewelry.  Do not use heating pads over the affected area.  Avoid having blood pressure checked on the affected limb.  Keep all follow-up visits as directed by your health care provider. This is important. Contact a health care provider if:  You continue to have swelling in your limb.  You have a  fever.  You have a cut that does not heal.  You have redness or pain in the affected area.  You have new swelling in your limb that comes on suddenly.  You develop purplish spots or sores (lesions) on your limb. Get help right away if:  You have a skin rash.  You have chills or sweats.  You have shortness of breath. This information is not intended to replace advice given to you by your health care provider. Make sure you discuss any questions you have with your health care provider. Document Released: 12/14/2006 Document Revised: 10/24/2015 Document Reviewed: 01/24/2014 Elsevier Interactive Patient Education  2018 [employer] Inc[employer] Sulfamethoxazole; Trimethoprim, SMX-TMP tablets What is this medicine? SULFAMETHOXAZOLE; TRIMETHOPRIM or SMX-TMP (suhl fuh meth OK suh zohl; trye METH oh prim) is a combination of a sulfonamide antibiotic and a second  antibiotic, trimethoprim. It is used to treat or prevent certain kinds of bacterial infections. It will not work for colds, flu, or other viral infections. This medicine may be used for other purposes; ask your health care provider or pharmacist if you have questions. COMMON BRAND NAME(S): Bacter-Aid DS, Bactrim, Bactrim DS, Septra, Septra DS What should I tell my health care provider before I take this medicine? They need to know if you have any of these conditions: -anemia -asthma -being treated with anticonvulsants -if you frequently drink alcohol containing drinks -kidney disease -liver disease -low level of folic acid or ZOXWRUE-4-[id number]-6-phosphate dehydrogenase -poor nutrition or malabsorption -porphyria -severe allergies -thyroid disorder -an unusual or allergic reaction to sulfamethoxazole, trimethoprim, sulfa drugs, other medicines, foods, dyes, or preservatives -pregnant or trying to get pregnant -breast-feeding How should I use this medicine? Take this medicine by mouth with a full glass of water. Follow the directions on the prescription label. Take your medicine at regular intervals. Do not take it more often than directed. Do not skip doses or stop your medicine early. Talk to your pediatrician regarding the use of this medicine in children. Special care may be needed. This medicine has been used in children as young as 382 months of age. Overdosage: If you think you have taken too much of this medicine contact a poison control center or emergency room at once. NOTE: This medicine is only for you. Do not share this medicine with others. What if I miss a dose? If you miss a dose, take it as soon as you can. If it is almost time for your next dose, take only that dose. Do not take double or extra doses. What may interact with this medicine? Do not take this medicine with any of the following medications: -aminobenzoate potassium -dofetilide -metronidazole This medicine may also  interact with the following medications: -ACE inhibitors like benazepril, enalapril, lisinopril, and ramipril -birth control pills -cyclosporine -digoxin -diuretics -indomethacin -medicines for diabetes -methenamine -methotrexate -phenytoin -potassium supplements -pyrimethamine -sulfinpyrazone -tricyclic antidepressants -warfarin This list may not describe all possible interactions. Give your health care provider a list of all the medicines, herbs, non-prescription drugs, or dietary supplements you use. Also tell them if you smoke, drink alcohol, or use illegal drugs. Some items may interact with your medicine. What should I watch for while using this medicine? Tell your doctor or health care professional if your symptoms do not improve. Drink several glasses of water a day to reduce the risk of kidney problems. Do not treat diarrhea with over the counter products. Contact your doctor if you have diarrhea that lasts more than 2 days or if it is  severe and watery. This medicine can make you more sensitive to the sun. Keep out of the sun. If you cannot avoid being in the sun, wear protective clothing and use a sunscreen. Do not use sun lamps or tanning beds/booths. What side effects may I notice from receiving this medicine? Side effects that you should report to your doctor or health care professional as soon as possible: -allergic reactions like skin rash or hives, swelling of the face, lips, or tongue -breathing problems -fever or chills, sore throat -irregular heartbeat, chest pain -joint or muscle pain -pain or difficulty passing urine -red pinpoint spots on skin -redness, blistering, peeling or loosening of the skin, including inside the mouth -unusual bleeding or bruising -unusually weak or tired -yellowing of the eyes or skin Side effects that usually do not require medical attention (report to your doctor or health care professional if they continue or are  bothersome): -diarrhea -dizziness -headache -loss of appetite -nausea, vomiting -nervousness This list may not describe all possible side effects. Call your doctor for medical advice about side effects. You may report side effects to FDA at 1-800-FDA-1088. Where should I keep my medicine? Keep out of the reach of children. Store at room temperature between 20 to 25 degrees C (68 to 77 degrees F). Protect from light. Throw away any unused medicine after the expiration date. NOTE: This sheet is a summary. It may not cover all possible information. If you have questions about this medicine, talk to your doctor, pharmacist, or health care provider.  2018 Elsevier/Gold Standard (2012-09-23 14:38:26)

## 2017-03-29 NOTE — Progress Notes (Addendum)
Right lower extremity cleansed with sterile NS.  ABD dressings applied to site - wrapped with Kerlix gauze and secured with medipore tape.  Patient tolerated procedure well.

## 2017-03-29 NOTE — Progress Notes (Signed)
Subjective:    Patient ID: Kirk Lucas, male    DOB: 08/19/1960, 57 y.o.   MRN: [id number]  HPI Kirk Lucas, a 57 year old male with a history of hypertension, congestive heart failure, COPD, morbid obesity and right lower leg cellulitis presents to establish care.  Patient is under the care of cardiology for hypertension and congestive heart failure and pulmonology for COPD.  However, patient does not have a primary provider.  He has mostly been using the emergency department for all primary needs.  He is taking all prescribed medications consistently. Patient is complaining of a nonhealing wound to right lower leg.  Patient has been evaluated in the emergency department on 02/21/2017 and 03/10/2017 for this problem.  On 03/10/2017 patient was prescribed doxycycline 100 mg twice daily for 7 days.  Patient took medication in its entirety without satisfactory relief.  He is complaining of increased drainage requiring 3-4 dressing changes throughout the day.  Also patient endorses pain to right lower leg.  Pain intensity is 5-6/10 characterized as constant and aching.  Patient states that pain intensity increases when applying full weight.  Patient also endorses right lower extremity edema.  Right lower leg wound has been worsening over the past several months.  Patient has not been followed by wound specialist for this problem.  Patient has a history of lower extremity DVT and pulmonary embolism.  He is on chronic anticoagulation therapy.  Patient has consistently been taking Eliquis 5 mg twice daily.  He denies any current signs of bleeding.  Patient also has a history of COPD.  Symptoms have been controlled on current medication regimen.  Patient is followed by Dr. Sandrea HughsMichael Wert, pulmonology.    [name] also has a history of hypertension and congestive heart failure.  Patient has been taking antihypertensive medications consistently.  Medications include furosemide 40 mg daily.  Patient continues to  have bilateral lower extremity edema.  He denies shortness of breath, chronic cough, chest pain, heart palpitations, syncope, and/or tachypnea at present.. Patient was last evaluated by Dr. Elease HashimotoNahser, cardiology on 02/14/2016, he has been lost to follow up.  Most recent echocardiogram was on 05/13/2016.  Patient underwent echocardiogram while admitted to inpatient services.  Ejection fraction at that time was 60-65%.  Past Medical History:  Diagnosis Date  . CHF (congestive heart failure) (HCC) June 2017   normal LVF by echo  . COPD (chronic obstructive pulmonary disease) (HCC)   . Hypertension   . Morbid obesity (HCC)    BMI 46  . Sleep apnea    by history  . Smoker    Social History   Socioeconomic History  . Marital status: Single    Spouse name: Not on file  . Number of children: Not on file  . Years of education: Not on file  . Highest education level: Not on file  Social Needs  . Financial resource strain: Not on file  . Food insecurity - worry: Not on file  . Food insecurity - inability: Not on file  . Transportation needs - medical: Not on file  . Transportation needs - non-medical: Not on file  Occupational History  . Not on file  Tobacco Use  . Smoking status: Former Smoker    Packs/day: 1.25    Years: 38.00    Pack years: 47.50    Types: Cigarettes    Last attempt to quit: 09/21/2015    Years since quitting: 1.5  . Smokeless tobacco: Never Used  Substance and Sexual Activity  .  Alcohol use: No    Alcohol/week: 0.0 oz  . Drug use: No  . Sexual activity: Not on file  Other Topics Concern  . Not on file  Social History Narrative   Single, lives with his sister. Currently unemployed, he has worked in [employer] in the past.   Immunization History  Administered Date(s) Administered  . Influenza,inj,Quad PF,6+ Mos 12/26/2015, 01/18/2017    Review of Systems  Constitutional: Positive for fatigue and unexpected weight change (Weight gain).  HENT:  Negative.   Respiratory: Negative.   Endocrine: Negative for polydipsia, polyphagia and polyuria.  Genitourinary: Negative.   Musculoskeletal: Negative.   Skin: Positive for rash and wound.       Right lower extremity wound, draining  Hematological: Negative.   Psychiatric/Behavioral: Negative.        Objective:   Physical Exam  HENT:  Head: Normocephalic.  Right Ear: External ear normal.  Mouth/Throat: Oropharynx is clear and moist.  Eyes: Conjunctivae and EOM are normal. Pupils are equal, round, and reactive to light.  Cardiovascular: Normal rate and regular rhythm.  Pulses:      Dorsalis pedis pulses are 1+ on the right side, and 1+ on the left side.  Bilateral lower extremity edema, 2+ pitting  Pulmonary/Chest: Effort normal and breath sounds normal.  Abdominal: Soft. Bowel sounds are normal.  Skin:  Left leg lymphedema Satelliting to dorsal aspect of left foot.  Flaking, increased tan drainage Dressing saturated   Psychiatric: He has a normal mood and affect. His behavior is normal. Judgment and thought content normal.         BP 127/80 (BP Location: Right Arm, Patient Position: Sitting, Cuff Size: Large)   Pulse 78   Temp 97.8 F (36.6 C) (Oral)   Resp 16   Ht 6\' 1"  (1.854 m)   Wt (!) 324 lb (147 kg)   SpO2 90%   BMI 42.75 kg/m   Assessment & Plan:  1. Morbid obesity due to excess calories (HCC) Recommend a low-fat carbohydrate modify diet divided over 5-6 small meals throughout the day.  Also recommend 3-4 bottles of water per day.  Patient given information for an 1800-calorie diet. - HgB A1c - TSH   2. Skin ulcer of right lower leg, limited to breakdown of skin (HCC) Right leg wound clean with 0.9% normal saline, wet-to-dry dressing applied - sulfamethoxazole-trimethoprim (BACTRIM DS,SEPTRA DS) 800-160 MG tablet; Take 1 tablet by mouth 2 (two) times daily for 14 days.  Dispense: 28 tablet; Refill: 0 - traMADol (ULTRAM) 50 MG tablet; Take 1 tablet (50  mg total) by mouth every 6 (six) hours as needed.  Dispense: 30 tablet; Refill: 0 - WOUND CULTURE - CBC with Differential - AMB referral to wound care center  3. Cellulitis of other specified site - sulfamethoxazole-trimethoprim (BACTRIM DS,SEPTRA DS) 800-160 MG tablet; Take 1 tablet by mouth 2 (two) times daily for 14 days.  Dispense: 28 tablet; Refill: 0 - traMADol (ULTRAM) 50 MG tablet; Take 1 tablet (50 mg total) by mouth every 6 (six) hours as needed.  Dispense: 30 tablet; Refill: 0 - WOUND CULTURE - CBC with Differential - AMB referral to wound care center  4. Leg pain, right - traMADol (ULTRAM) 50 MG tablet; Take 1 tablet (50 mg total) by mouth every 6 (six) hours as needed.  Dispense: 30 tablet; Refill: 0 - ketorolac (TORADOL) 30 MG/ML injection 30 mg  5. History of pulmonary embolism We will continue Eliquis 5 mg twice daily for  chronic anticoagulation therapy  6. COPD GOLD IV  Continue to follow up with Dr. Sherene Sires as scheduled  7. Need for hepatitis C screening test Patient reports that hepatitis C screen has been completed, will review previous medical records as they become available  8. Skin yeast infection - fluconazole (DIFLUCAN) 150 MG tablet; Take 1 tablet (150 mg total) by mouth once a week.  Dispense: 4 tablet; Refill: 0  9. Diabetes mellitus screening Hemoglobin A1c is 5.2 - HgB A1c  10. Lymphedema of right lower extremity Patient warrants a referral to vein and vascular for further evaluation of lymphedema.   - Ambulatory referral to Vascular Surgery   RTC: One month for chronic conditions   Nolon Nations  MSN, FNP-C [facility] [address] [place], [place] [address] [phone]   The patient was given clear instructions to go to ER or return to medical center if symptoms do not improve, worsen or new problems develop. The patient verbalized understanding.

## 2017-03-30 ENCOUNTER — Telehealth: Payer: Self-pay

## 2017-03-30 LAB — CBC WITH DIFFERENTIAL/PLATELET
BASOS: 0 %
Basophils Absolute: 0 10*3/uL (ref 0.0–0.2)
EOS (ABSOLUTE): 0.2 10*3/uL (ref 0.0–0.4)
Eos: 2 %
HEMATOCRIT: 47.6 % (ref 37.5–51.0)
Hemoglobin: 16.4 g/dL (ref 13.0–17.7)
IMMATURE GRANS (ABS): 0 10*3/uL (ref 0.0–0.1)
IMMATURE GRANULOCYTES: 0 %
LYMPHS: 17 %
Lymphocytes Absolute: 1.9 10*3/uL (ref 0.7–3.1)
MCH: 32.7 pg (ref 26.6–33.0)
MCHC: 34.5 g/dL (ref 31.5–35.7)
MCV: 95 fL (ref 79–97)
MONOS ABS: 0.9 10*3/uL (ref 0.1–0.9)
Monocytes: 8 %
NEUTROS ABS: 8.1 10*3/uL — AB (ref 1.4–7.0)
NEUTROS PCT: 73 %
Platelets: 194 10*3/uL (ref 150–379)
RBC: 5.02 x10E6/uL (ref 4.14–5.80)
RDW: 13.9 % (ref 12.3–15.4)
WBC: 11.2 10*3/uL — ABNORMAL HIGH (ref 3.4–10.8)

## 2017-03-30 LAB — TSH: TSH: 2.93 u[IU]/mL (ref 0.450–4.500)

## 2017-03-30 NOTE — Telephone Encounter (Signed)
-----   Message from Massie MaroonLachina M Hollis, [place] sent at 03/30/2017 10:43 AM EST ----- Regarding: lab results Please inform Mr. Xiong that white blood cell count is mildly elevated. Continue antibiotic therapy as prescribed. Please call wound care to schedule a first available appointment for patient. Please remind him of scheduled follow up appointment in 1 months.   Thanks

## 2017-03-30 NOTE — Telephone Encounter (Signed)
Called and spoke with patient, advised that white blood cell is mildly elevated and that he should continue antibiotic therapy as prescribed. Informed of appointment with wound center for 04/05/2017 @2 :00pm. Advised that he keep a follow up with us as well in 1 month. Patient verbalized understanding and had no questions at this time. Thanks!

## 2017-04-01 ENCOUNTER — Other Ambulatory Visit: Payer: Self-pay | Admitting: Family Medicine

## 2017-04-01 ENCOUNTER — Telehealth: Payer: Self-pay

## 2017-04-01 DIAGNOSIS — A491 Streptococcal infection, unspecified site: Secondary | ICD-10-CM

## 2017-04-01 MED ORDER — AMOXICILLIN 500 MG PO CAPS: 500.0000 mg | ORAL_CAPSULE | Freq: Three times a day (TID) | ORAL | 0 refills | Status: DC

## 2017-04-01 NOTE — Telephone Encounter (Signed)
Kirk Lucas,  Patient states he was told his antibiotic was going to be switched but it was not sent to the pharmacy. Please advise. Thanks!

## 2017-04-01 NOTE — Progress Notes (Signed)
Meds ordered this encounter  Medications  . amoxicillin (AMOXIL) 500 MG capsule    Sig: Take 1 capsule (500 mg total) by mouth 3 (three) times daily.    Dispense:  30 capsule    Refill:  0    Nolon NationsLachina Moore Ashlley Booher  MSN, FNP-C [facility] [address]  [place], [place] [id number] [phone]

## 2017-04-02 LAB — WOUND CULTURE

## 2017-04-05 ENCOUNTER — Telehealth: Payer: Self-pay | Admitting: Internal Medicine

## 2017-04-05 ENCOUNTER — Encounter (HOSPITAL_BASED_OUTPATIENT_CLINIC_OR_DEPARTMENT_OTHER): Payer: Self-pay | Attending: Internal Medicine

## 2017-04-05 DIAGNOSIS — Z86711 Personal history of pulmonary embolism: Secondary | ICD-10-CM | POA: Insufficient documentation

## 2017-04-05 DIAGNOSIS — Z87891 Personal history of nicotine dependence: Secondary | ICD-10-CM | POA: Insufficient documentation

## 2017-04-05 DIAGNOSIS — L97811 Non-pressure chronic ulcer of other part of right lower leg limited to breakdown of skin: Secondary | ICD-10-CM | POA: Insufficient documentation

## 2017-04-05 DIAGNOSIS — Z86718 Personal history of other venous thrombosis and embolism: Secondary | ICD-10-CM | POA: Insufficient documentation

## 2017-04-05 DIAGNOSIS — I87331 Chronic venous hypertension (idiopathic) with ulcer and inflammation of right lower extremity: Secondary | ICD-10-CM | POA: Insufficient documentation

## 2017-04-05 DIAGNOSIS — G4730 Sleep apnea, unspecified: Secondary | ICD-10-CM | POA: Insufficient documentation

## 2017-04-05 DIAGNOSIS — J449 Chronic obstructive pulmonary disease, unspecified: Secondary | ICD-10-CM | POA: Insufficient documentation

## 2017-04-05 DIAGNOSIS — I890 Lymphedema, not elsewhere classified: Secondary | ICD-10-CM | POA: Insufficient documentation

## 2017-04-05 DIAGNOSIS — I5032 Chronic diastolic (congestive) heart failure: Secondary | ICD-10-CM | POA: Insufficient documentation

## 2017-04-05 DIAGNOSIS — L97211 Non-pressure chronic ulcer of right calf limited to breakdown of skin: Secondary | ICD-10-CM | POA: Insufficient documentation

## 2017-04-05 DIAGNOSIS — I110 Hypertensive heart disease with heart failure: Secondary | ICD-10-CM | POA: Insufficient documentation

## 2017-04-05 NOTE — Telephone Encounter (Signed)
Spoke with pt. He is requesting samples of Utibron. We do not have samples at this time. Nothing further was needed.

## 2017-04-08 ENCOUNTER — Telehealth: Payer: Self-pay | Admitting: Internal Medicine

## 2017-04-08 NOTE — Telephone Encounter (Signed)
Called and spoke with patient he is aware that we do not have any samples at the moment.

## 2017-04-16 ENCOUNTER — Ambulatory Visit (HOSPITAL_COMMUNITY)
Admission: RE | Admit: 2017-04-16 | Discharge: 2017-04-16 | Disposition: A | Discharge status: Home / Self Care | Payer: Self-pay | Source: Ambulatory Visit | Attending: Internal Medicine | Admitting: Internal Medicine

## 2017-04-16 ENCOUNTER — Other Ambulatory Visit: Payer: Self-pay | Admitting: Internal Medicine

## 2017-04-16 DIAGNOSIS — S99921A Unspecified injury of right foot, initial encounter: Secondary | ICD-10-CM

## 2017-04-16 DIAGNOSIS — R937 Abnormal findings on diagnostic imaging of other parts of musculoskeletal system: Secondary | ICD-10-CM | POA: Insufficient documentation

## 2017-04-20 ENCOUNTER — Ambulatory Visit: Payer: Self-pay | Admitting: Internal Medicine

## 2017-04-29 ENCOUNTER — Ambulatory Visit: Payer: Self-pay | Admitting: Family Medicine

## 2017-04-29 ENCOUNTER — Ambulatory Visit: Payer: Self-pay | Admitting: Cardiovascular Disease

## 2017-05-06 ENCOUNTER — Ambulatory Visit: Payer: Self-pay | Admitting: Internal Medicine

## 2017-05-07 ENCOUNTER — Encounter (HOSPITAL_BASED_OUTPATIENT_CLINIC_OR_DEPARTMENT_OTHER): Payer: Self-pay | Attending: Internal Medicine

## 2017-05-07 ENCOUNTER — Ambulatory Visit: Payer: Self-pay | Admitting: Family Medicine

## 2017-05-07 ENCOUNTER — Telehealth: Payer: Self-pay | Admitting: Internal Medicine

## 2017-05-07 ENCOUNTER — Ambulatory Visit (INDEPENDENT_AMBULATORY_CARE_PROVIDER_SITE_OTHER): Payer: Self-pay | Admitting: Family Medicine

## 2017-05-07 ENCOUNTER — Encounter: Payer: Self-pay | Admitting: Family Medicine

## 2017-05-07 DIAGNOSIS — I509 Heart failure, unspecified: Secondary | ICD-10-CM | POA: Insufficient documentation

## 2017-05-07 DIAGNOSIS — G4730 Sleep apnea, unspecified: Secondary | ICD-10-CM | POA: Insufficient documentation

## 2017-05-07 DIAGNOSIS — J449 Chronic obstructive pulmonary disease, unspecified: Secondary | ICD-10-CM

## 2017-05-07 DIAGNOSIS — I110 Hypertensive heart disease with heart failure: Secondary | ICD-10-CM | POA: Insufficient documentation

## 2017-05-07 DIAGNOSIS — S81801A Unspecified open wound, right lower leg, initial encounter: Secondary | ICD-10-CM

## 2017-05-07 DIAGNOSIS — Z86711 Personal history of pulmonary embolism: Secondary | ICD-10-CM

## 2017-05-07 DIAGNOSIS — L97819 Non-pressure chronic ulcer of other part of right lower leg with unspecified severity: Secondary | ICD-10-CM | POA: Insufficient documentation

## 2017-05-07 DIAGNOSIS — L97229 Non-pressure chronic ulcer of left calf with unspecified severity: Secondary | ICD-10-CM | POA: Insufficient documentation

## 2017-05-07 DIAGNOSIS — I890 Lymphedema, not elsewhere classified: Secondary | ICD-10-CM | POA: Insufficient documentation

## 2017-05-07 DIAGNOSIS — L97829 Non-pressure chronic ulcer of other part of left lower leg with unspecified severity: Secondary | ICD-10-CM | POA: Insufficient documentation

## 2017-05-07 DIAGNOSIS — I87331 Chronic venous hypertension (idiopathic) with ulcer and inflammation of right lower extremity: Secondary | ICD-10-CM | POA: Insufficient documentation

## 2017-05-07 DIAGNOSIS — S81801S Unspecified open wound, right lower leg, sequela: Secondary | ICD-10-CM

## 2017-05-07 MED ORDER — INDACATEROL-GLYCOPYRROLATE 27.5-15.6 MCG IN CAPS: 1.0000 | ORAL_CAPSULE | Freq: Two times a day (BID) | RESPIRATORY_TRACT | 0 refills | Status: AC

## 2017-05-07 MED ORDER — IPRATROPIUM BROMIDE 0.02 % IN SOLN
0.5000 mg | Freq: Once | RESPIRATORY_TRACT | Status: AC
  Administered 2017-05-07: 0.5 mg via RESPIRATORY_TRACT

## 2017-05-07 MED ORDER — ALBUTEROL SULFATE HFA 108 (90 BASE) MCG/ACT IN AERS: 2.0000 | INHALATION_SPRAY | Freq: Four times a day (QID) | RESPIRATORY_TRACT | 5 refills | Status: DC | PRN

## 2017-05-07 MED ORDER — ALBUTEROL SULFATE (2.5 MG/3ML) 0.083% IN NEBU
2.5000 mg | INHALATION_SOLUTION | Freq: Once | RESPIRATORY_TRACT | Status: AC
  Administered 2017-05-07: 2.5 mg via RESPIRATORY_TRACT

## 2017-05-07 MED ORDER — INDACATEROL-GLYCOPYRROLATE 27.5-15.6 MCG IN CAPS: 1.0000 | ORAL_CAPSULE | Freq: Two times a day (BID) | RESPIRATORY_TRACT | 11 refills | Status: DC

## 2017-05-07 NOTE — Progress Notes (Signed)
Subjective:    Patient ID: Kirk Lucas, male    DOB: [birth year], 57 y.o.   MRN: [id number]  HPI Kirk Lucas, a 57 year old male with a history of hypertension, congestive heart failure, COPD, morbid obesity and right lower leg cellulitis presents for a 1 month follow up of chronic conditions.    Patient is under the care of cardiology for hypertension and congestive heart failure and pulmonology for COPD.  Patient endorses shortness of breath and periodic wheezing.   He is taking all prescribed medications consistently without relief.   Patient is complaining of a  wound to right lower leg.  Patient has been evaluated in the emergency department on 02/21/2017 and 03/10/2017 for this problem.  On 03/10/2017 patient was prescribed doxycycline 100 mg twice daily for 7 days.  Patient took medication in its entirety without satisfactory relief. Wound culture was positive for beta hemolytic streptococcus. Patient was treated with antibiotic therapy for 10 days. He is also followed by wound care weekly.    Kirk Lucas also has a history of hypertension and congestive heart failure.  Patient has been taking antihypertensive medications consistently.  Medications include furosemide 40 mg daily.  Patient continues to have bilateral lower extremity edema.  He denies shortness of breath, chronic cough, chest pain, heart palpitations, syncope, and/or tachypnea at present.. Patient was last evaluated by Dr. Elease Lucas, cardiology on 02/14/2016, he has been lost to follow up.  Most recent echocardiogram was on 05/13/2016.  Patient underwent echocardiogram while admitted to inpatient services.  Ejection fraction at that time was 60-65%.  Past Medical History:  Diagnosis Date  . CHF (congestive heart failure) (HCC) June 2017   normal LVF by echo  . COPD (chronic obstructive pulmonary disease) (HCC)   . Hypertension   . Morbid obesity (HCC)    BMI 46  . Sleep apnea    by history  . Smoker    Social History    Socioeconomic History  . Marital status: Single    Spouse name: Not on file  . Number of children: Not on file  . Years of education: Not on file  . Highest education level: Not on file  Social Needs  . Financial resource strain: Not on file  . Food insecurity - worry: Not on file  . Food insecurity - inability: Not on file  . Transportation needs - medical: Not on file  . Transportation needs - non-medical: Not on file  Occupational History  . Not on file  Tobacco Use  . Smoking status: Former Smoker    Packs/day: 1.25    Years: 38.00    Pack years: 47.50    Types: Cigarettes    Last attempt to quit: 09/21/2015    Years since quitting: 1.6  . Smokeless tobacco: Never Used  Substance and Sexual Activity  . Alcohol use: No    Alcohol/week: 0.0 oz  . Drug use: No  . Sexual activity: Not on file  Other Topics Concern  . Not on file  Social History Narrative   Single, lives with his sister. Currently unemployed, he has worked in [employer] in the past.   Immunization History  Administered Date(s) Administered  . Influenza,inj,Quad PF,6+ Mos 12/26/2015, 01/18/2017    Review of Systems  Constitutional: Positive for fatigue and unexpected weight change (Weight gain).  HENT: Negative.   Respiratory: Negative.   Endocrine: Negative for polydipsia, polyphagia and polyuria.  Genitourinary: Negative.   Musculoskeletal: Negative.   Skin: Positive for rash  and wound.       Right lower extremity wound, draining  Hematological: Negative.   Psychiatric/Behavioral: Negative.        Objective:   Physical Exam  HENT:  Head: Normocephalic.  Right Ear: External ear normal.  Mouth/Throat: Oropharynx is clear and moist.  Eyes: Conjunctivae and EOM are normal. Pupils are equal, round, and reactive to light.  Cardiovascular: Normal rate and regular rhythm.  Pulses:      Dorsalis pedis pulses are 1+ on the right side, and 1+ on the left side.  Bilateral lower  extremity edema, 2+ pitting  Pulmonary/Chest: Effort normal and breath sounds normal.  Abdominal: Soft. Bowel sounds are normal.  Skin:  Dressing clean, dry, and intact to right lower extremity.   Psychiatric: He has a normal mood and affect. His behavior is normal. Judgment and thought content normal.         BP 122/66 (BP Location: Left Arm, Patient Position: Sitting, Cuff Size: Large)   Pulse 77   Temp 98.2 F (36.8 C) (Oral)   Resp 16   Ht 6' (1.829 m)   Wt (!) 315 lb (142.9 kg)   SpO2 (!) 87%   BMI 42.72 kg/m   Assessment & Plan:  1. Morbid obesity due to excess calories [facility](HCC) Patient has decreased dietary fats and sugars over the past month. He has decreased weight by 9 pounds. Body mass index is 42.72 kg/m.  Recommend that he continues carbohydrate/fat modified diet and increase daily physical activity level.  2. History of pulmonary embolism Patient is on chronic anticoagulation therapy.  Will review creatinine level on today.  - Basic Metabolic Panel  3. COPD GOLD IV  Follow up with Dr. Sherene Lucas, pulmonologist as scheduled.   - Indacaterol-Glycopyrrolate (UTIBRON NEOHALER) 27.5-15.6 MCG CAPS; Place 1 puff into inhaler and inhale 2 (two) times daily.  Dispense: 60 capsule; Refill: 11 - albuterol (PROVENTIL HFA;VENTOLIN HFA) 108 (90 Base) MCG/ACT inhaler; Inhale 2 puffs into the lungs every 6 (six) hours as needed for wheezing or shortness of breath.  Dispense: 1 Inhaler; Refill: 5 - albuterol (PROVENTIL) (2.5 MG/3ML) 0.083% nebulizer solution 2.5 mg - ipratropium (ATROVENT) nebulizer solution 0.5 mg  4. Leg wound, right, sequela  Patient is scheduled to follow up with wound care on today. Will defer to wound care for further work up and evaluation.   RTC: 3 months for chronic conditions   Kirk NationsLachina Moore Remedios Mckone  MSN, FNP-C Patient Care [facility] [address]  [place], [place] [id number] [phone]

## 2017-05-07 NOTE — Telephone Encounter (Signed)
Pt has been provided with one sample of Utiborn.  Pt was instructed to f/u with MW in 2-7671mo per OV note on 02/05/18. Pt's apt was scheduled for 05/06/17, however apt reminder stated 05/07/17. Pt has rescheduled.  Nothing further is needed at this time.

## 2017-05-08 LAB — BASIC METABOLIC PANEL
BUN / CREAT RATIO: 13 (ref 9–20)
BUN: 13 mg/dL (ref 6–24)
CHLORIDE: 96 mmol/L (ref 96–106)
CO2: 30 mmol/L — AB (ref 20–29)
Calcium: 9.3 mg/dL (ref 8.7–10.2)
Creatinine, Ser: 1.02 mg/dL (ref 0.76–1.27)
GFR calc Af Amer: 96 mL/min/{1.73_m2} (ref >59)
GFR calc non Af Amer: 83 mL/min/{1.73_m2} (ref >59)
Glucose: 92 mg/dL (ref 65–99)
POTASSIUM: 4.3 mmol/L (ref 3.5–5.2)
SODIUM: 143 mmol/L (ref 134–144)

## 2017-05-10 ENCOUNTER — Telehealth: Payer: Self-pay

## 2017-05-10 NOTE — Telephone Encounter (Signed)
Called and spoke with patient, advised that all labs were within a normal range and no medication changes are needed at this time. Asked that patient keep next scheduled appointment. Thanks!

## 2017-05-10 NOTE — Patient Instructions (Signed)
No medication changes warranted on today.  Body mass index is 42.72 kg/m. Recommend a lowfat, low carbohydrate diet divided over 5-6 small meals, increase water intake, and increase daily physical activity level.   Follow up with specialists as scheduled.

## 2017-05-10 NOTE — Telephone Encounter (Signed)
-----   Message from Kirk MaroonLachina M Hollis, [place] sent at 05/08/2017  8:19 AM EST ----- Regarding: lab results Please inform patient that all labs are within a normal range. No medication changes warranted at this time. Patient to follow up as scheduled.   Thanks

## 2017-05-31 ENCOUNTER — Encounter: Payer: Self-pay | Admitting: Internal Medicine

## 2017-05-31 ENCOUNTER — Ambulatory Visit (INDEPENDENT_AMBULATORY_CARE_PROVIDER_SITE_OTHER): Payer: Self-pay | Admitting: Internal Medicine

## 2017-05-31 VITALS — BP 126/82 | HR 70 | Ht 72.0 in | Wt 304.0 lb

## 2017-05-31 DIAGNOSIS — J441 Chronic obstructive pulmonary disease with (acute) exacerbation: Secondary | ICD-10-CM

## 2017-05-31 DIAGNOSIS — J449 Chronic obstructive pulmonary disease, unspecified: Secondary | ICD-10-CM

## 2017-05-31 DIAGNOSIS — I2781 Cor pulmonale (chronic): Secondary | ICD-10-CM

## 2017-05-31 MED ORDER — PREDNISONE 10 MG PO TABS: ORAL_TABLET | ORAL | 0 refills | Status: DC

## 2017-05-31 MED ORDER — AZITHROMYCIN 250 MG PO TABS: ORAL_TABLET | ORAL | 0 refills | Status: DC

## 2017-05-31 MED ORDER — GLYCOPYRROLATE-FORMOTEROL 9-4.8 MCG/ACT IN AERO: 2.0000 | INHALATION_SPRAY | Freq: Two times a day (BID) | RESPIRATORY_TRACT | 3 refills | Status: DC

## 2017-05-31 MED ORDER — GLYCOPYRROLATE-FORMOTEROL 9-4.8 MCG/ACT IN AERO
2.0000 | INHALATION_SPRAY | Freq: Two times a day (BID) | RESPIRATORY_TRACT | 0 refills | Status: DC
Start: 2017-05-31 — End: 2017-05-31

## 2017-05-31 NOTE — Progress Notes (Signed)
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  with onset in 30's of doe at wt 235 much worse 2017 assoc with leg swelling much worse 2 weeks pta with wheezing/coughing    Admit date: 08/13/2015 Discharge date: 08/20/2015    Discharge Diagnoses:  Principal Problem:   Respiratory failure with hypoxia (HCC) Active Problems:   Acute right-sided CHF (congestive heart failure) (HCC)   CHF- etiology not yet determined   Morbid obesity -BMI 46   Smoker   Sleep apnea by history   Pulmonary hypertension-PA 52 mmHg by echo   NSVT (nonsustained ventricular tachycardia) (HCC)   Family history of coronary artery disease   Hypoxia    11/22/2015 1st [facility] office visit/ Veva Grimley  Re GOLD IV COPD/ MO Chief Complaint  Patient presents with  . Advice Only    Referred by Dr. Elease Hashimoto for hypoxia, pulm htn.  pt c/o increased SOB Xseveral years.   presenlty sob x 50 ft walking slow pace and not using any inhalers or 02  rec Wear 02 2lpm as much as you can at 2lpm  Plan A = Automatic =   Anoro 1 click each am,  take two deep drags to make sure you get it all  Plan B = Backup Only use your albuterol    12/26/2015  f/u ov/Wm Sahagun re:  GOLD IV/ MO/ [facility]   Chief Complaint  Patient presents with  . Follow-up    Breathing has improved on Anoro and he has not had to use albuterol.    never got the 02 delivered. 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 -2-D echo ordered, showed LVEF of 60-65% with severe LVH -Troponin is negative at 0.03 -PCCM , consulted, no indication for thrombolysis, hemodynamically stable. -Patient was on heparin drip, switched to Eliquis for PE/DVT treatment. (Was on LD-ASA, discontinued) -Literature reviewed, although for patient >120 kg DOACs benefits over Coumadin is unclear, probably it's easier for the patient to be on Eliquis as he will have difficulty to check his INRs. -Discharged on Eliquis, follow-up with PCP and pulmonology. -After D/C patient requested pain medications, tramadol 25 mg 20 pills prescribed, per RN Mr. Fayrene Fearing patient left the premises but he will call him to come back in the morning to pick the prescription.  Acute respiratory failure with hypoxia -Required 6 L of oxygen through high flow nasal cannula while is in the hospital. -Seen by PCCM, recommended oxygen on discharge. -Has history of possible obesity hypoventilation syndrome, follows with Dr. Sherene Sires as outpatient. -Per CM no home oxygen will be provided by [facility] as charity, desaturation to screen done. -  Still desaturate on 6 L of oxygen with ambulation, discharged on 6 L of oxygen.  Acute on chronic diastolic CHF -Although his BNP is 112 he has lower extremity edema, started on Lasix. -His 2-D echo showed LVEF of 60-65% with severe LVH. -Has moderate to severe elevation of pulmonary artery pressure (likely secondary to recent PE) -Diuresed with net 11.1 L of negative fluid balance on discharge, Wt from 329 down to 307 lbs on discharge. -Discharged on 40 mg of Lasix twice a day and metoprolol. Evaluate for ACEI as outpatient.  COPD: Inactive. -Continue home inhaler Utibron  HTN -Continue current  medications.      06/18/2016  f/u ov/Raja Caputi re: post hos f/u for PE with RV strain, copd IV. Severe obesity with hypercarbia  Chief Complaint  Patient presents with  . Follow-up    Pt states that his breathing is slightly improved. Pt notes that congestion is better. Still using Utibron -- seems to be working well.     Very confused with details of care, referred to community wellness but canceled it for reasons not clear to me and the issues re disability were not addressed with CM during admit and he has not funds for meds rec You need to go ahead and file with social security for disability asap Call the phone number for the community health center for follow up up and meds - if they assign you a doctor then follow up here can be as needed   08/17/16  [facility].  Please start Disability process as soon as possible.  Amoxicillin 500mg  Three times a day  For 7 days  Mucinex DM Twice daily  As needed  Cough /congestion  Pt assistance papers for Eliquis and Utibron as discussed     10/22/2016  f/u ov/Marvelous Woolford re:  GOLD IV / 02 dep hs  Chief Complaint  Patient presents with  . Follow-up    Breathing is unchanged. He stopped Eliquis 4 months back since he is unable to afford med. He has noticed swelling in his rt foot ever since.    off utibron x sev days  Wakes up in recliner upright on 6lpm / wakes up rested / min am congestion / occ ha  Walking x 20 min around neighboorhood s stopping slow pace  rec If am headache try the next night to cut it down by 2lpm and see what effect this has the next  Am  rec If am headache try the next night to cut it down by 2lpm and see what effect this has the next  Am     05/31/2017  f/u ov/Raymie Giammarco re:  Gold IV / 02 dep at hs  Chief Complaint  Patient presents with  . Follow-up    Increased cough x 4 days- prod with green sputum.  He states that his breathing seems worse and he is having some chest tightness.  He states does not  have an albuterol inhaler and he has been out of his Utibron for the past few wks.    Dyspnea:  MMRC2 = can't walk a nl pace on a flat grade s sob but does fine slow and flat / difficult with steps Cough: ok until 4 d prior to OV   Sleep: on 2.5lpm occ am HA SABA use:  Doesn't actually have one   No obvious day to day or daytime variability or assoc excess/ purulent sputum or mucus plugs or hemoptysis or cp or chest tightness, subjective  wheeze or overt sinus or hb symptoms. No unusual exposure hx or h/o childhood pna/ asthma or knowledge of premature birth.   Also denies any obvious fluctuation of symptoms with weather or environmental changes or other aggravating or alleviating factors except as outlined above   Current Allergies, Complete Past Medical History, Past Surgical History, Family History, and Social History were reviewed in [employer] record.  ROS  The following are not active complaints unless bolded Hoarseness, sore throat, dysphagia, dental problems, itching, sneezing,  nasal congestion or discharge of excess mucus or purulent secretions, ear ache,   fever, chills, sweats, unintended wt loss or wt gain, classically pleuritic or exertional cp,  orthopnea pnd or leg swelling, presyncope, palpitations, abdominal pain, anorexia, nausea, vomiting, diarrhea  or change in bowel habits or change in bladder habits, change in stools or change in urine, dysuria, hematuria,  rash, arthralgias, visual complaints, headache, numbness, weakness or ataxia or problems with walking or coordination,  change in mood/affect or memory.        Current Meds  Medication Sig  . apixaban (ELIQUIS) 5 MG TABS tablet Take 5 mg 2 (two) times daily by mouth.  . calcium carbonate (TUMS EX) 750 MG chewable tablet Chew 2 tablets by mouth as needed for heartburn.  . furosemide (LASIX) 40 MG tablet Take 1 tablet (40 mg total) by mouth 2 (two) times daily.  . hydrocortisone cream 1 % Apply 1  application topically as needed (for rosacea).  . metoprolol tartrate (LOPRESSOR) 25 MG tablet TAKE ONE TABLET BY MOUTH TWICE DAILY                  Objective:   Physical Exam  amb hoarse wm nad    05/31/2017           304  10/22/2016         320 06/18/2016         332  Vs d/c wt of 307 05/07/2016           329  02/21/2016       330  12/26/2015        322  11/22/15 (!) 307 lb 9.6 oz (139.5 kg)  11/21/15 (!) 305 lb 12.8 oz (138.7 kg)  08/28/15 (!) 306 lb 12.8 oz (139.2 kg)      Vital signs reviewed - Note on arrival 02 sats  90% on RA         very distant bs bilaterally  CV:  RRR  no s3 or murmur or increase in P2,  1+ ptting edema both lower ext with venous stasis changes also bilaterally      SKIN: warm and dry without lesions  / chronic venous changes both legs     HEENT: nl dentition / oropharynx. Nl external ear canals without cough reflex - moderate bilateral non-specific turbinate edema     NECK :  without JVD/Nodes/TM/ nl carotid upstrokes bilaterally   LUNGS: no acc muscle use,  Mod barrel  contour chest wall with bilateral  Distant bs s audible wheeze and  without cough on insp or exp maneuver and mod   Hyperresonant  to  percussion bilaterally     CV:  RRR  no s3 or murmur or increase in P2, and  1+ pitting lower ext sym edema with chronic venous stasis changes as well   ABD:  soft and nontender with pos mid insp Hoover's  in the supine position. No bruits or organomegaly appreciated,  bowel sounds nl  MS:   Nl gait/  ext warm without deformities, calf tenderness, cyanosis or clubbing No obvious joint restrictions   SKIN: warm and dry without lesions x for venous stasis changes as described above.    NEURO:  alert, approp, nl sensorium with  no motor or cerebellar deficits apparent.                    Assessment & Plan:

## 2017-05-31 NOTE — Patient Instructions (Addendum)
Change utibron to Bevesipi Take 2 puffs first thing in am and then another 2 puffs about 12 hours later.   Work on inhaler technique:  relax and gently blow all the way out then take a nice smooth deep breath back in, triggering the inhaler at same time you start breathing in.  Hold for up to 5 seconds if you can. Rinse and gargle with water when done      zpak Prednisone 10 mg take  4 each am x 2 days,   2 each am x 2 days,  1 each am x 2 days and stop   We will submit  A request for [employer] to supply your Bevespi    Please schedule a follow up visit in 3 months but call sooner if needed  with all medications /inhalers/ solutions in hand so we can verify exactly what you are taking. This includes all medications from all doctors and over the counters

## 2017-06-02 ENCOUNTER — Encounter: Payer: Self-pay | Admitting: Internal Medicine

## 2017-06-02 DIAGNOSIS — J441 Chronic obstructive pulmonary disease with (acute) exacerbation: Secondary | ICD-10-CM | POA: Insufficient documentation

## 2017-06-02 NOTE — Assessment & Plan Note (Signed)
Acute flare in a patient who has has difficulty affording his medications and staying on them. Because he has no insurance he should be eligible for [employer]'s program and therefore we will make the change today to a trial of Bevespi 2 puffs every 12 hours and treat him acutely with a Z-Pak and prednisone for 6 days.

## 2017-06-02 NOTE — Assessment & Plan Note (Signed)
Quit smoking 08/2015 Spirometry 11/22/2015  FEV1 0.91 (22%)  Ratio 45  - Trial of utibron one bid 12/26/2015 > improved  - Alpha 1 >MM  - 05/31/2017  After extensive coaching inhaler device  effectiveness =    75% > try bevespi/ [place] [facility] [place]

## 2017-06-02 NOTE — Assessment & Plan Note (Signed)
08/14/15 Echo Left ventricle: The cavity size was normal. There was mild   concentric hypertrophy. Systolic function was normal. The   estimated ejection fraction was in the range of 55% to 60%. Wall   motion was normal; there were no regional wall motion   abnormalities. - Ventricular septum: The contour showed diastolic flattening. - Mitral valve: Calcified annulus. - Left atrium: The atrium was mildly dilated. - Right ventricle: The cavity size was moderately dilated. Wall   thickness was normal. Systolic function was moderately to   severely reduced. - Right atrium: The atrium was severely dilated. - Pulmonary arteries: Systolic pressure was moderately increased.   PA peak pressure: 52 mm Hg (S).  Adequate control on present rx, reviewed in detail with pt > no change in rx needed

## 2017-06-04 ENCOUNTER — Telehealth: Payer: Self-pay | Admitting: Internal Medicine

## 2017-06-04 ENCOUNTER — Encounter (HOSPITAL_BASED_OUTPATIENT_CLINIC_OR_DEPARTMENT_OTHER): Payer: Self-pay | Attending: Internal Medicine

## 2017-06-04 DIAGNOSIS — J449 Chronic obstructive pulmonary disease, unspecified: Secondary | ICD-10-CM | POA: Insufficient documentation

## 2017-06-04 DIAGNOSIS — I890 Lymphedema, not elsewhere classified: Secondary | ICD-10-CM | POA: Insufficient documentation

## 2017-06-04 DIAGNOSIS — I509 Heart failure, unspecified: Secondary | ICD-10-CM | POA: Insufficient documentation

## 2017-06-04 DIAGNOSIS — I110 Hypertensive heart disease with heart failure: Secondary | ICD-10-CM | POA: Insufficient documentation

## 2017-06-04 DIAGNOSIS — I87331 Chronic venous hypertension (idiopathic) with ulcer and inflammation of right lower extremity: Secondary | ICD-10-CM | POA: Insufficient documentation

## 2017-06-04 DIAGNOSIS — G4730 Sleep apnea, unspecified: Secondary | ICD-10-CM | POA: Insufficient documentation

## 2017-06-04 DIAGNOSIS — L97219 Non-pressure chronic ulcer of right calf with unspecified severity: Secondary | ICD-10-CM | POA: Insufficient documentation

## 2017-06-04 NOTE — Telephone Encounter (Signed)
Forms have been received and placed at Leslie's station.  Will route this message to her for her to follow-up on regarding status of pt's forms.

## 2017-06-04 NOTE — Telephone Encounter (Signed)
Forms had already been signed to I faxed the application along with rx for symbicort to [place] [facility] [place]  Pt aware

## 2017-07-02 ENCOUNTER — Telehealth: Payer: Self-pay | Admitting: Internal Medicine

## 2017-07-02 MED ORDER — FUROSEMIDE 40 MG PO TABS: 40.0000 mg | ORAL_TABLET | Freq: Two times a day (BID) | ORAL | 0 refills | Status: DC

## 2017-07-02 NOTE — Telephone Encounter (Signed)
Spoke with patient. He is requesting a refill on his furosemide  to be sent to [facility] in [facility]. Dr. Arthor Captain was the original prescriber but Jordan Hawks can not get in touch with him or his office. '  Dr. Sherene Sires, are you ok with [place] sending in a refill with your name? Per the patient he has been without it for 4 days and the edema is returning.   Thanks!

## 2017-07-02 NOTE — Telephone Encounter (Signed)
Ok x one refill then let the original provider refill as whoever is doing the refills going forward has to also monitor for side effects eg K / kidney/ bp results

## 2017-07-02 NOTE — Telephone Encounter (Signed)
rx sent to preferred pharmacy.  Pt aware of refill and recs.  Nothing further needed.

## 2017-07-19 ENCOUNTER — Encounter: Payer: Self-pay | Admitting: Cardiovascular Disease

## 2017-07-19 ENCOUNTER — Ambulatory Visit (INDEPENDENT_AMBULATORY_CARE_PROVIDER_SITE_OTHER): Payer: Self-pay | Admitting: Cardiovascular Disease

## 2017-07-19 ENCOUNTER — Encounter (INDEPENDENT_AMBULATORY_CARE_PROVIDER_SITE_OTHER): Payer: Self-pay

## 2017-07-19 VITALS — BP 122/76 | HR 80 | Ht 72.0 in | Wt 308.0 lb

## 2017-07-19 DIAGNOSIS — I2781 Cor pulmonale (chronic): Secondary | ICD-10-CM

## 2017-07-19 DIAGNOSIS — I272 Pulmonary hypertension, unspecified: Secondary | ICD-10-CM

## 2017-07-19 NOTE — Progress Notes (Signed)
07/19/2017 Kirk Lucas   Nov 24, 1960  [id number]  Primary Physician Dorena Dew, FNP Primary Cardiologist: Dr. Acie Fredrickson   Reason for Visit/CC: [facility] F/u for Right Sided Heart Failure  HPI:  57 y/o obese male who presents to clinic for post hospital f/u. 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 No evidence of pulmonary emboli on CT. LV systolic function was normal and his diastolic function also appeared to be normal. He was treated with IV lasix. Net diuresis of negative 14.9 L. 32lb weight loss (347-->315). Baseline weight was felt to be less than 310lb. He was switchted switch to PO lasix. Recommendations was to treat underlying condition including obesity and management of sleep apnea.   He presents to clinic for post hospital f/u. Breathing is improved. Still with trace bilateral LEE. No CP. He reports full medication compliance. He has been checking his weight daily at home and has continued to loose weight. Discharge weight was 315 lb. Office weight today is 308 lb. He has reduced his sodium intake. He has a f/u opt to see a new PCP in a week. Plan is for OP sleep study. He feels that he likely has OSA. He has been told that he snores when he sleeps and has apenic spells.    11/21/2015:  Kirk Lucas is a 48 year old gentleman who I met in the hospital in June, 2017. He has a history of pulmonary hypertension, obesity hypoventilation.  Thinks that his breathing is a little bit rough. Wants to take the lasix BID.  He's taken his Lasix twice a day on some occasions when he felt that his breathing was short. Has some leg cramping the day after taking the BID lasix .  No productive cough No fever.  Dec. 15, 2017:  Seen today for follow up of cor pulmonale. Has seen pulmonary  Is on a new inhaler Has gained weight. Has not been taken his lasix regularly since he is doing some  side work   Jul 19, 2017:  Kirk Lucas is seen to back today for follow-up of his cor pulmonale.  He has morbid obesity. He was hospitalized in March, 2018 with progressive shortness of breath.  He has a history of COPD with chronic hypoxic and hypercarbic respiratory failure. Patient was found to have bilateral pulmonary emboli.  Patient has been on Eliquis since that time.  Is taking his meds,   Wearing his home O2.   Has chronic edema ( from his severe pulmonary HTN )  Gave him information  about Lounge Dr. 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  We discussed the importance of weight loss   Current Outpatient Medications  Medication Sig Dispense Refill  . apixaban (ELIQUIS) 5 MG TABS tablet Take 5 mg 2 (two) times daily by mouth.    . calcium carbonate (TUMS EX) 750 MG chewable tablet Chew 2 tablets by mouth as needed for heartburn.    . furosemide (LASIX) 40 MG tablet Take 1 tablet (40 mg total) by mouth 2 (two) times daily. Further refills need to come from PCP. 60 tablet 0  . Glycopyrrolate-Formoterol (BEVESPI AEROSPHERE) 9-4.8 MCG/ACT AERO Inhale 2 puffs into the lungs 2 (two) times daily. 3 Inhaler 3  . hydrocortisone cream 1 % Apply 1 application topically as needed (for rosacea).    . metoprolol tartrate (LOPRESSOR) 25 MG tablet  TAKE ONE TABLET BY MOUTH TWICE DAILY 60 tablet 5   No current facility-administered medications for this visit.     Allergies  Allergen Reactions  . Codeine Nausea Only  . Duricef [Cefadroxil] Itching    Social History   Socioeconomic History  . Marital status: Single    Spouse name: Not on file  . Number of children: Not on file  . Years of education: Not on file  . Highest education level: Not on file  Occupational History  . Not on file  Social Needs  . Financial resource strain: Not on file  . Food insecurity:    Worry: Not on file    Inability: Not on file  . Transportation needs:      Medical: Not on file    Non-medical: Not on file  Tobacco Use  . Smoking status: Former Smoker    Packs/day: 1.25    Years: 38.00    Pack years: 47.50    Types: Cigarettes    Last attempt to quit: 09/21/2015    Years since quitting: 1.8  . Smokeless tobacco: Never Used  Substance and Sexual Activity  . Alcohol use: No    Alcohol/week: 0.0 oz  . Drug use: No  . Sexual activity: Not on file  Lifestyle  . Physical activity:    Days per week: Not on file    Minutes per session: Not on file  . Stress: Not on file  Relationships  . Social connections:    Talks on phone: Not on file    Gets together: Not on file    Attends religious service: Not on file    Active member of club or organization: Not on file    Attends meetings of clubs or organizations: Not on file    Relationship status: Not on file  . Intimate partner violence:    Fear of current or ex partner: Not on file    Emotionally abused: Not on file    Physically abused: Not on file    Forced sexual activity: Not on file  Other Topics Concern  . Not on file  Social History Narrative   Single, lives with his sister. Currently unemployed, he has worked in [employer] in the past.      Review of systems was noted in current history.  Otherwise review of systems is negative.Marland Kitchen  Physical Exam: Blood pressure 122/76, pulse 80, height 6' (1.829 m), weight (!) 308 lb (139.7 kg), SpO2 (!) 87 %.  GEN:  Well nourished, well developed in no acute distress HEENT: Normal NECK: No JVD; No carotid bruits LYMPHATICS: No lymphadenopathy CARDIAC: RRR, soft systolic murmur  RESPIRATORY:  Clear to auscultation without rales, wheezing or rhonchi  ABDOMEN: Soft, non-tender, non-distended MUSCULOSKELETAL:  No edema; No deformity  SKIN: Warm and dry NEUROLOGIC:  Alert and oriented x 3   EKG Jul 19, 2017: Normal sinus rhythm at 80 beats a minute.  Normal EKG.  ASSESSMENT AND PLAN:   1. Predominant right heart failure  with cor pulmonale, possibly secondary to  obesity hypoventilation syndrome:   Patient was  found to have pulmonary emboli last year.  He is now on Eliquis.  I strongly advised him to work on weight loss program. He is on diuretic.  I encouraged him to work on an exercise program. We will see him back in 1 year.  Mertie Moores, MD  07/19/2017 4:05 PM    [facility] [place],  [place], [place]  [address] Pager [phone] Phone: [phone]; Fax: [phone]

## 2017-07-19 NOTE — Patient Instructions (Addendum)
Medication Instructions:  Your physician recommends that you continue on your current medications as directed. Please refer to the Current Medication list given to you today.   Labwork: None Ordered   Testing/Procedures: None Ordered   Follow-Up: Your physician wants you to follow-up in: 1 year with Dr. Roderica Cathell.  You will receive a reminder letter in the mail two months in advance. If you don't receive a letter, please call our office to schedule the follow-up appointment.   If you need a refill on your cardiac medications before your next appointment, please call your pharmacy.   Thank you for choosing [facility]! Michelle Swinyer, RN [phone]     For your  leg edema you  should do  the following 1. Leg elevation - I recommend the Lounge Dr. Leg rest.  See below for details  2. Salt restriction  -  Use potassium chloride instead of regular salt as a salt substitute. 3. Walk regularly 4. Compression hose - [facility] 5. Weight loss    Available on [email or web address] Or  Go to [email or web address]      

## 2017-07-30 ENCOUNTER — Encounter (HOSPITAL_BASED_OUTPATIENT_CLINIC_OR_DEPARTMENT_OTHER): Payer: Self-pay | Attending: Internal Medicine

## 2017-07-30 DIAGNOSIS — L97812 Non-pressure chronic ulcer of other part of right lower leg with fat layer exposed: Secondary | ICD-10-CM | POA: Insufficient documentation

## 2017-07-30 DIAGNOSIS — I87323 Chronic venous hypertension (idiopathic) with inflammation of bilateral lower extremity: Secondary | ICD-10-CM | POA: Insufficient documentation

## 2017-07-30 DIAGNOSIS — Z87891 Personal history of nicotine dependence: Secondary | ICD-10-CM | POA: Insufficient documentation

## 2017-07-30 DIAGNOSIS — I890 Lymphedema, not elsewhere classified: Secondary | ICD-10-CM | POA: Insufficient documentation

## 2017-07-30 DIAGNOSIS — J449 Chronic obstructive pulmonary disease, unspecified: Secondary | ICD-10-CM | POA: Insufficient documentation

## 2017-07-30 DIAGNOSIS — I110 Hypertensive heart disease with heart failure: Secondary | ICD-10-CM | POA: Insufficient documentation

## 2017-07-30 DIAGNOSIS — G4730 Sleep apnea, unspecified: Secondary | ICD-10-CM | POA: Insufficient documentation

## 2017-07-30 DIAGNOSIS — L97822 Non-pressure chronic ulcer of other part of left lower leg with fat layer exposed: Secondary | ICD-10-CM | POA: Insufficient documentation

## 2017-07-30 DIAGNOSIS — I5030 Unspecified diastolic (congestive) heart failure: Secondary | ICD-10-CM | POA: Insufficient documentation

## 2017-08-09 ENCOUNTER — Encounter (HOSPITAL_BASED_OUTPATIENT_CLINIC_OR_DEPARTMENT_OTHER): Payer: Self-pay | Attending: Internal Medicine

## 2017-08-09 ENCOUNTER — Ambulatory Visit (INDEPENDENT_AMBULATORY_CARE_PROVIDER_SITE_OTHER): Payer: Self-pay | Admitting: Family Medicine

## 2017-08-09 ENCOUNTER — Encounter: Payer: Self-pay | Admitting: Family Medicine

## 2017-08-09 VITALS — BP 111/55 | HR 61 | Temp 98.6°F | Resp 16 | Ht 72.0 in | Wt 311.0 lb

## 2017-08-09 DIAGNOSIS — R911 Solitary pulmonary nodule: Secondary | ICD-10-CM

## 2017-08-09 DIAGNOSIS — I5032 Chronic diastolic (congestive) heart failure: Secondary | ICD-10-CM

## 2017-08-09 DIAGNOSIS — I890 Lymphedema, not elsewhere classified: Secondary | ICD-10-CM | POA: Insufficient documentation

## 2017-08-09 DIAGNOSIS — J441 Chronic obstructive pulmonary disease with (acute) exacerbation: Secondary | ICD-10-CM

## 2017-08-09 DIAGNOSIS — R062 Wheezing: Secondary | ICD-10-CM

## 2017-08-09 DIAGNOSIS — J449 Chronic obstructive pulmonary disease, unspecified: Secondary | ICD-10-CM | POA: Insufficient documentation

## 2017-08-09 DIAGNOSIS — I87323 Chronic venous hypertension (idiopathic) with inflammation of bilateral lower extremity: Secondary | ICD-10-CM | POA: Insufficient documentation

## 2017-08-09 DIAGNOSIS — I110 Hypertensive heart disease with heart failure: Secondary | ICD-10-CM | POA: Insufficient documentation

## 2017-08-09 DIAGNOSIS — I509 Heart failure, unspecified: Secondary | ICD-10-CM | POA: Insufficient documentation

## 2017-08-09 DIAGNOSIS — L97822 Non-pressure chronic ulcer of other part of left lower leg with fat layer exposed: Secondary | ICD-10-CM | POA: Insufficient documentation

## 2017-08-09 DIAGNOSIS — L97812 Non-pressure chronic ulcer of other part of right lower leg with fat layer exposed: Secondary | ICD-10-CM | POA: Insufficient documentation

## 2017-08-09 DIAGNOSIS — G4730 Sleep apnea, unspecified: Secondary | ICD-10-CM | POA: Insufficient documentation

## 2017-08-09 MED ORDER — ALBUTEROL SULFATE (2.5 MG/3ML) 0.083% IN NEBU
2.5000 mg | INHALATION_SOLUTION | Freq: Once | RESPIRATORY_TRACT | Status: AC
  Administered 2017-08-09: 2.5 mg via RESPIRATORY_TRACT

## 2017-08-09 MED ORDER — IPRATROPIUM BROMIDE 0.02 % IN SOLN
0.5000 mg | Freq: Once | RESPIRATORY_TRACT | Status: AC
  Administered 2017-08-09: 0.5 mg via RESPIRATORY_TRACT

## 2017-08-09 NOTE — Progress Notes (Signed)
Subjective:    Patient ID: Kirk Lucas, male    DOB: 1960/09/21, 57 y.o.   MRN: [id number]  HPI Kirk Lucas, a 57 year old male with a history of hypertension, congestive heart failure, COPD, morbid obesity and right lower leg cellulitis presents for a follow up of chronic conditions.    Patient is under the care of cardiology for hypertension and congestive heart failure and pulmonology for COPD.  He is taking all prescribed medications consistently without relief.   Kirk Lucas also has a history of hypertension and congestive heart failure.  Patient has been taking antihypertensive medications consistently.  Medications include furosemide 40 mg daily.  Patient continues to have bilateral lower extremity edema.  He denies shortness of breath, chronic cough, chest pain, heart palpitations, syncope, and/or tachypnea at present.  Most recent echocardiogram was on 05/13/2016.  Patient underwent echocardiogram while admitted to inpatient services.  Ejection fraction at that time was 60-65%.  Past Medical History:  Diagnosis Date  . CHF (congestive heart failure) (HCC) June 2017   normal LVF by echo  . COPD (chronic obstructive pulmonary disease) (HCC)   . Hypertension   . Morbid obesity (HCC)    BMI 46  . Sleep apnea    by history  . Smoker    Social History   Socioeconomic History  . Marital status: Single    Spouse name: Not on file  . Number of children: Not on file  . Years of education: Not on file  . Highest education level: Not on file  Occupational History  . Not on file  Social Needs  . Financial resource strain: Not on file  . Food insecurity:    Worry: Not on file    Inability: Not on file  . Transportation needs:    Medical: Not on file    Non-medical: Not on file  Tobacco Use  . Smoking status: Former Smoker    Packs/day: 1.25    Years: 38.00    Pack years: 47.50    Types: Cigarettes    Last attempt to quit: 09/21/2015    Years since quitting: 1.8  .  Smokeless tobacco: Never Used  Substance and Sexual Activity  . Alcohol use: No    Alcohol/week: 0.0 oz  . Drug use: No  . Sexual activity: Not on file  Lifestyle  . Physical activity:    Days per week: Not on file    Minutes per session: Not on file  . Stress: Not on file  Relationships  . Social connections:    Talks on phone: Not on file    Gets together: Not on file    Attends religious service: Not on file    Active member of club or organization: Not on file    Attends meetings of clubs or organizations: Not on file    Relationship status: Not on file  . Intimate partner violence:    Fear of current or ex partner: Not on file    Emotionally abused: Not on file    Physically abused: Not on file    Forced sexual activity: Not on file  Other Topics Concern  . Not on file  Social History Narrative   Single, lives with his sister. Currently unemployed, he has worked in [employer] in the past.   Immunization History  Administered Date(s) Administered  . Influenza,inj,Quad PF,6+ Mos 12/26/2015, 01/18/2017    Review of Systems  Constitutional: Positive for fatigue and unexpected weight change (Weight gain).  HENT: Negative.   Respiratory: Negative.   Endocrine: Negative for polydipsia, polyphagia and polyuria.  Genitourinary: Negative.   Musculoskeletal: Negative.   Skin: Positive for rash and wound.       Right lower extremity wound, draining  Hematological: Negative.   Psychiatric/Behavioral: Negative.        Objective:   Physical Exam  HENT:  Head: Normocephalic.  Right Ear: External ear normal.  Mouth/Throat: Oropharynx is clear and moist.  Eyes: Pupils are equal, round, and reactive to light. Conjunctivae and EOM are normal.  Cardiovascular: Normal rate and regular rhythm.  Pulses:      Dorsalis pedis pulses are 1+ on the right side, and 1+ on the left side.  Bilateral lower extremity edema, 2+ pitting  Pulmonary/Chest: Effort normal and breath  sounds normal.  Abdominal: Soft. Bowel sounds are normal.  Skin:  Dressing clean, dry, and intact to right lower extremity.   Psychiatric: He has a normal mood and affect. His behavior is normal. Judgment and thought content normal.        BP (!) 111/55 (BP Location: Left Arm, Patient Position: Sitting, Cuff Size: Large)   Pulse 61   Temp 98.6 F (37 C) (Oral)   Resp 16   Ht 6' (1.829 m)   Wt (!) 311 lb (141.1 kg)   SpO2 90%   BMI 42.18 kg/m   Assessment & Plan:  1. Chronic diastolic CHF (congestive heart failure) (HCC) - Basic Metabolic Panel  2. Morbid obesity due to excess calories (HCC) Body mass index is 42.18 kg/m. Filed Weights   08/09/17 1047  Weight: (!) 311 lb (141.1 kg)  The patient is asked to make an attempt to improve diet and exercise patterns to aid in medical management of this problem.  3. COPD with acute exacerbation (HCC) - albuterol (PROVENTIL) (2.5 MG/3ML) 0.083% nebulizer solution 2.5 mg - ipratropium (ATROVENT) nebulizer solution 0.5 mg  4. Solitary pulmonary nodule Reviewed previous CT, measures 7X5 mm right upper lung nodule.  Will repeat chest CT - CT Chest Wo Contrast; Future  5. Wheezing - albuterol (PROVENTIL) (2.5 MG/3ML) 0.083% nebulizer solution 2.5 mg - ipratropium (ATROVENT) nebulizer solution 0.5 mg   RTC: 6 months for chronic conditions   Nolon Nations  MSN, FNP-C Patient Care [facility] [address] [place], [place] [address] [phone]

## 2017-08-09 NOTE — Patient Instructions (Signed)
We will follow-up by phone after reviewing CT of the chest to reevaluate solid pulmonary nodule.   Also, we will follow-up by phone with any abnormal laboratory results   Your blood pressure is controlled on current medication regimen, no changes warranted on today.  - Continue medication, monitor blood pressure at home. Continue DASH diet. Reminder to go to the ER if any CP, SOB, nausea, dizziness, severe HA, changes vision/speech, left arm numbness and tingling and jaw pain.

## 2017-08-10 LAB — BASIC METABOLIC PANEL
BUN / CREAT RATIO: 13 (ref 9–20)
BUN: 14 mg/dL (ref 6–24)
CHLORIDE: 99 mmol/L (ref 96–106)
CO2: 27 mmol/L (ref 20–29)
Calcium: 9.3 mg/dL (ref 8.7–10.2)
Creatinine, Ser: 1.07 mg/dL (ref 0.76–1.27)
GFR calc Af Amer: 90 mL/min/{1.73_m2} (ref >59)
GFR calc non Af Amer: 78 mL/min/{1.73_m2} (ref >59)
GLUCOSE: 82 mg/dL (ref 65–99)
POTASSIUM: 4 mmol/L (ref 3.5–5.2)
SODIUM: 143 mmol/L (ref 134–144)

## 2017-08-12 ENCOUNTER — Telehealth: Payer: Self-pay | Admitting: Family Medicine

## 2017-08-12 ENCOUNTER — Ambulatory Visit (HOSPITAL_BASED_OUTPATIENT_CLINIC_OR_DEPARTMENT_OTHER)
Admission: RE | Admit: 2017-08-12 | Discharge: 2017-08-12 | Disposition: A | Discharge status: Home / Self Care | Payer: Self-pay | Source: Ambulatory Visit | Attending: Family Medicine | Admitting: Family Medicine

## 2017-08-12 DIAGNOSIS — I2510 Atherosclerotic heart disease of native coronary artery without angina pectoris: Secondary | ICD-10-CM | POA: Insufficient documentation

## 2017-08-12 DIAGNOSIS — I700 Atherosclerosis of aorta: Secondary | ICD-10-CM | POA: Insufficient documentation

## 2017-08-12 DIAGNOSIS — R911 Solitary pulmonary nodule: Secondary | ICD-10-CM

## 2017-08-12 DIAGNOSIS — J439 Emphysema, unspecified: Secondary | ICD-10-CM | POA: Insufficient documentation

## 2017-08-12 DIAGNOSIS — J9811 Atelectasis: Secondary | ICD-10-CM | POA: Insufficient documentation

## 2017-08-12 NOTE — Telephone Encounter (Signed)
Jacolyn Reedyobert Motter, 57 year old male with a history of acute on chronic CHF, COPD, and chronic anticoagulation therapy underwent a chest CT for history of a lunIMPRESSION: Stable 8 x 5 mm nodule at the minor fissure.  Central peribronchial thickening with subsegmental atelectasis in LEFT lower lobe.  No other intrathoracic abnormalities.  Scattered atherosclerotic calcifications including within the coronary arteries.  Prominent main pulmonary artery raising question of pulmonary arterial hypertension.  Aortic Atherosclerosis (ICD10-I70.0) and Emphysema (ICD10-J43.9).g nodule.     No further treatment or evaluation warranted at this time.  Will repeat chest CT in 1 year to ensure stability of lung nodule.   Nolon NationsLachina Moore Bellatrix Devonshire  MSN, FNP-C Patient Care [facility] [address]  [place], [place] [id number] [phone]

## 2017-08-30 ENCOUNTER — Encounter (HOSPITAL_BASED_OUTPATIENT_CLINIC_OR_DEPARTMENT_OTHER): Payer: Self-pay | Attending: Internal Medicine

## 2017-08-30 ENCOUNTER — Ambulatory Visit: Payer: Self-pay | Admitting: Internal Medicine

## 2017-08-30 DIAGNOSIS — J449 Chronic obstructive pulmonary disease, unspecified: Secondary | ICD-10-CM | POA: Insufficient documentation

## 2017-08-30 DIAGNOSIS — I110 Hypertensive heart disease with heart failure: Secondary | ICD-10-CM | POA: Insufficient documentation

## 2017-08-30 DIAGNOSIS — L97829 Non-pressure chronic ulcer of other part of left lower leg with unspecified severity: Secondary | ICD-10-CM | POA: Insufficient documentation

## 2017-08-30 DIAGNOSIS — G4730 Sleep apnea, unspecified: Secondary | ICD-10-CM | POA: Insufficient documentation

## 2017-08-30 DIAGNOSIS — I509 Heart failure, unspecified: Secondary | ICD-10-CM | POA: Insufficient documentation

## 2017-08-30 DIAGNOSIS — L97819 Non-pressure chronic ulcer of other part of right lower leg with unspecified severity: Secondary | ICD-10-CM | POA: Insufficient documentation

## 2017-08-30 DIAGNOSIS — I890 Lymphedema, not elsewhere classified: Secondary | ICD-10-CM | POA: Insufficient documentation

## 2017-08-30 DIAGNOSIS — I87323 Chronic venous hypertension (idiopathic) with inflammation of bilateral lower extremity: Secondary | ICD-10-CM | POA: Insufficient documentation

## 2017-09-23 ENCOUNTER — Ambulatory Visit (INDEPENDENT_AMBULATORY_CARE_PROVIDER_SITE_OTHER): Payer: Self-pay | Admitting: Internal Medicine

## 2017-09-23 ENCOUNTER — Encounter: Payer: Self-pay | Admitting: Internal Medicine

## 2017-09-23 VITALS — BP 130/86 | HR 72 | Ht 72.0 in | Wt 307.8 lb

## 2017-09-23 DIAGNOSIS — J449 Chronic obstructive pulmonary disease, unspecified: Secondary | ICD-10-CM

## 2017-09-23 MED ORDER — GLYCOPYRROLATE-FORMOTEROL 9-4.8 MCG/ACT IN AERO: 2.0000 | INHALATION_SPRAY | Freq: Two times a day (BID) | RESPIRATORY_TRACT | 3 refills | Status: DC

## 2017-09-23 MED ORDER — GLYCOPYRROLATE-FORMOTEROL 9-4.8 MCG/ACT IN AERO: 2.0000 | INHALATION_SPRAY | Freq: Two times a day (BID) | RESPIRATORY_TRACT | 0 refills | Status: AC

## 2017-09-23 NOTE — Patient Instructions (Signed)
No change medications    Please schedule a follow up visit in 6 months but call sooner if needed  

## 2017-09-23 NOTE — Progress Notes (Signed)
Subjective:    Patient ID: Kirk Lucas, male    DOB: 1960/08/17,     MRN: [id number]    Brief patient profile:  57 yowm quit smoking  08/2015 good athlete in school at wt 190  with onset in 30's of doe at wt 235 much worse 2017 assoc with leg swelling much worse 2 weeks pta with wheezing/coughing    Admit date: 08/13/2015 Discharge date: 08/20/2015    Discharge Diagnoses:  Principal Problem:   Respiratory failure with hypoxia (HCC) Active Problems:   Acute right-sided CHF (congestive heart failure) (HCC)   CHF- etiology not yet determined   Morbid obesity -BMI 46   Smoker   Sleep apnea by history   Pulmonary hypertension-PA 52 mmHg by echo   NSVT (nonsustained ventricular tachycardia) (HCC)   Family history of coronary artery disease   Hypoxia    11/22/2015 1st Kirk Lucas/ Kirk Lucas  Re GOLD IV COPD/ MO Chief Complaint  Patient presents with  . Advice Only    Referred by Dr. Elease Hashimoto for hypoxia, pulm htn.  pt c/o increased SOB Xseveral years.   presenlty sob x 50 ft walking slow pace and not using any inhalers or 02  rec Wear 02 2lpm as much as you can at 2lpm  Plan A = Automatic =   Anoro 1 click each am,  take two deep drags to make sure you get it all  Plan B = Backup Only use your albuterol    12/26/2015  f/u ov/Kirk Lucas re:  GOLD IV/ MO/ [facility]   Chief Complaint  Patient presents with  . Follow-up    Breathing has improved on Anoro and he has not had to use albuterol.    never got the 02 delivered. 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 -2-D echo ordered, showed LVEF of 60-65% with severe LVH -Troponin is negative at 0.03 -PCCM , consulted, no indication for thrombolysis, hemodynamically stable. -Patient was on heparin drip, switched to Eliquis for PE/DVT treatment. (Was on LD-ASA, discontinued) -Literature reviewed, although for patient >120 kg DOACs benefits over Coumadin is unclear, probably it's easier for the patient to be on Eliquis as he will have difficulty to check his INRs. -Discharged on Eliquis, follow-up with PCP and pulmonology. -After D/C patient requested pain medications, tramadol 25 mg 20 pills prescribed, per RN Kirk Lucas patient left the premises but he will call him to come back in the morning to pick the prescription.  Acute respiratory failure with hypoxia -Required 6 L of oxygen through high flow nasal cannula while is in the hospital. -Seen by PCCM, recommended oxygen on discharge. -Has history of possible obesity hypoventilation syndrome, follows with Dr. Sherene Sires as outpatient. -Per CM no home oxygen will be provided by [facility] as charity, desaturation to screen done. -  Still desaturate on 6 L of oxygen with ambulation, discharged on 6 L of oxygen.  Acute on chronic diastolic CHF -Although his BNP is 112 he has lower extremity edema, started on Lasix. -His 2-D echo showed LVEF of 60-65% with severe LVH. -Has moderate to severe elevation of pulmonary artery pressure (likely secondary to recent PE) -Diuresed with net 11.1 L of negative fluid balance on discharge, Wt from 329 down to 307 lbs on discharge. -Discharged on 40 mg of Lasix twice a day and metoprolol. Evaluate for ACEI as outpatient.  COPD: Inactive. -Continue home inhaler Utibron  HTN -Continue current  medications.      06/18/2016  f/u ov/Kirk Lucas re: post hos f/u for PE with RV strain, copd IV. Severe obesity with hypercarbia  Chief Complaint  Patient presents with  . Follow-up    Pt states that his breathing is slightly improved. Pt notes that congestion is better. Still using Utibron -- seems to be working well.     Very confused with details of care, referred to community [facility] but canceled it for reasons not clear to me and the issues re disability were not addressed with CM during admit and he has not funds for meds rec You need to go ahead and file with social security for disability asap Call the phone number for the community health center for follow up up and meds - if they assign you a doctor then follow up here can be as needed   08/17/16  Refer to [employer].  Please start Disability process as soon as possible.  Amoxicillin 500mg  Three times a day  For 7 days  Mucinex DM Twice daily  As needed  Cough /congestion  Pt assistance papers for Eliquis and Utibron as discussed     10/22/2016  f/u ov/Kirk Lucas re:  GOLD IV / 02 dep hs  Chief Complaint  Patient presents with  . Follow-up    Breathing is unchanged. He stopped Eliquis 4 months back since he is unable to afford med. He has noticed swelling in his rt foot ever since.    off utibron x sev days  Wakes up in recliner upright on 6lpm / wakes up rested / min am congestion / occ ha  Walking x 20 min around neighboorhood s stopping slow pace  rec If am headache try the next night to cut it down by 2lpm and see what effect this has the next  Am  rec If am headache try the next night to cut it down by 2lpm and see what effect this has the next  Am       05/31/2017  f/u ov/Kirk Lucas re:  Gold IV / 02 dep at hs  Chief Complaint  Patient presents with  . Follow-up    Increased cough x 4 days- prod with green sputum.  He states that his breathing seems worse and he is having some chest tightness.  He states does  not have an albuterol inhaler and he has been out of his Utibron for the past few wks.    Dyspnea:  MMRC2 = can't walk a nl pace on a flat grade s sob but does fine slow and flat / difficult with steps Cough: ok until 4 d prior to OV   Sleep: on 2.5lpm occ am HA SABA use:  Doesn't actually have one rec Change utibron to Bevesipi Take 2 puffs first thing in am and then another 2 puffs about 12 hours later.  Work on  inhaler technique: zpak Prednisone 10 mg take  4 each am x 2 days,   2 each am x 2 day Please schedule a follow up Lucas in 3 months but call sooner if needed  with all medications /inhalers/ solutions in hand so we can verify exactly what you are taking. This includes all medications from all doctors and over the counters   09/23/2017  f/u ov/Moraima Burd re: copd iv / bevespi only  Chief Complaint  Patient presents with  . Follow-up   Dyspnea:  Walking dogs s portable unit Cough: no  SABA use: none 02: 2.5 lpm hs only     No obvious day to day or daytime variability or assoc excess/ purulent sputum or mucus plugs or hemoptysis or cp or chest tightness, subjective wheeze or overt sinus or hb symptoms.   Sleeping: recliner but min elevation 2.5 lpm  without nocturnal  or early am exacerbation  of respiratory  c/o's or need for noct saba. Also denies any obvious fluctuation of symptoms with weather or environmental changes or other aggravating or alleviating factors except as outlined above   No unusual exposure hx or h/o childhood pna/ asthma or knowledge of premature birth.  Current Allergies, Complete Past Medical History, Past Surgical History, Family History, and Social History were reviewed in [employer] record.  ROS  The following are not active complaints unless bolded Hoarseness, sore throat, dysphagia, dental problems, itching, sneezing,  nasal congestion or discharge of excess mucus or purulent secretions, ear ache,   fever, chills, sweats,  unintended wt loss or wt gain, classically pleuritic or exertional cp,  orthopnea pnd or arm/hand swelling  or leg swelling, presyncope, palpitations, abdominal pain, anorexia, nausea, vomiting, diarrhea  or change in bowel habits or change in bladder habits, change in stools or change in urine, dysuria, hematuria,  rash, arthralgias, visual complaints, headache, numbness, weakness or ataxia or problems with walking or coordination,  change in mood or  memory.        Current Meds  Medication Sig  . apixaban (ELIQUIS) 5 MG TABS tablet Take 5 mg 2 (two) times daily by mouth.  . calcium carbonate (TUMS EX) 750 MG chewable tablet Chew 2 tablets by mouth as needed for heartburn.  . furosemide (LASIX) 40 MG tablet Take 1 tablet (40 mg total) by mouth 2 (two) times daily. Further refills need to come from PCP.  . hydrocortisone cream 1 % Apply 1 application topically as needed (for rosacea).            Objective:   Physical Exam  amb slt hoarse obese wm    09/23/2017        307  05/31/2017           304  10/22/2016         320 06/18/2016         332  Vs d/c wt of 307 05/07/2016           329  02/21/2016       330  12/26/2015        322  11/22/15 (!) 307 lb 9.6 oz (139.5 kg)  11/21/15 (!) 305 lb 12.8 oz (138.7 kg)  08/28/15 (!) 306 lb 12.8 oz (139.2 kg)      Vital signs reviewed - Note on arrival 02 sats  90% on RA       HEENT: nl dentition / oropharynx. Nl external ear canals without cough reflex - moderate bilateral non-specific turbinate  edema     NECK :  without JVD/Nodes/TM/ nl carotid upstrokes bilaterally   LUNGS: no acc muscle use,  Mod barrel  contour chest wall with bilateral  Distant bs s audible wheeze and  without cough on insp or exp maneuver and mod   Hyperresonant  to  percussion bilaterally     CV:  RRR  no s3 or murmur or increase in P2, and  1+ pitting both lower ext with compression wraps in place  ABD:  Quite obese/ soft and nontender with pos mid insp Hoover's   in the supine position. No bruits or organomegaly appreciated, bowel sounds nl  MS:   Nl gait/  ext warm without deformities, calf tenderness, cyanosis or clubbing No obvious joint restrictions   SKIN: warm and dry without lesions    NEURO:  alert, approp, nl sensorium with  no motor or cerebellar deficits apparent.                       Assessment & Plan:

## 2017-09-25 ENCOUNTER — Encounter: Payer: Self-pay | Admitting: Internal Medicine

## 2017-09-25 NOTE — Assessment & Plan Note (Signed)
Quit smoking 08/2015 Spirometry 11/22/2015  FEV1 0.91 (22%)  Ratio 45  - Trial of utibron one bid 12/26/2015 > improved  - Alpha 1 >MM  - 05/31/2017  After extensive coaching inhaler device  effectiveness =    75% > try bevespi/ [place] and me for supplies   Pt is Group B in terms of symptom/risk and laba/lama therefore appropriate rx at this point > continue bevespi

## 2017-09-25 NOTE — Assessment & Plan Note (Signed)
Body mass index is 41.75 kg/m.  -  trending no change  Lab Results  Component Value Date   TSH 2.930 03/29/2017     Contributing to gerd risk/ doe/reviewed the need and the process to achieve and maintain neg calorie balance > defer f/u primary care including intermittently monitoring thyroid status

## 2017-10-18 ENCOUNTER — Encounter (HOSPITAL_BASED_OUTPATIENT_CLINIC_OR_DEPARTMENT_OTHER): Payer: Self-pay | Attending: Internal Medicine

## 2017-10-18 DIAGNOSIS — I87333 Chronic venous hypertension (idiopathic) with ulcer and inflammation of bilateral lower extremity: Secondary | ICD-10-CM | POA: Insufficient documentation

## 2017-10-18 DIAGNOSIS — I110 Hypertensive heart disease with heart failure: Secondary | ICD-10-CM | POA: Insufficient documentation

## 2017-10-18 DIAGNOSIS — Z86718 Personal history of other venous thrombosis and embolism: Secondary | ICD-10-CM | POA: Insufficient documentation

## 2017-10-18 DIAGNOSIS — Z86711 Personal history of pulmonary embolism: Secondary | ICD-10-CM | POA: Insufficient documentation

## 2017-10-18 DIAGNOSIS — L97211 Non-pressure chronic ulcer of right calf limited to breakdown of skin: Secondary | ICD-10-CM | POA: Insufficient documentation

## 2017-10-18 DIAGNOSIS — J449 Chronic obstructive pulmonary disease, unspecified: Secondary | ICD-10-CM | POA: Insufficient documentation

## 2017-10-18 DIAGNOSIS — I890 Lymphedema, not elsewhere classified: Secondary | ICD-10-CM | POA: Insufficient documentation

## 2017-10-18 DIAGNOSIS — Z87891 Personal history of nicotine dependence: Secondary | ICD-10-CM | POA: Insufficient documentation

## 2017-10-18 DIAGNOSIS — I5032 Chronic diastolic (congestive) heart failure: Secondary | ICD-10-CM | POA: Insufficient documentation

## 2017-10-18 DIAGNOSIS — L309 Dermatitis, unspecified: Secondary | ICD-10-CM | POA: Insufficient documentation

## 2017-10-18 DIAGNOSIS — L97221 Non-pressure chronic ulcer of left calf limited to breakdown of skin: Secondary | ICD-10-CM | POA: Insufficient documentation

## 2017-10-18 DIAGNOSIS — G4730 Sleep apnea, unspecified: Secondary | ICD-10-CM | POA: Insufficient documentation

## 2017-10-26 ENCOUNTER — Other Ambulatory Visit: Payer: Self-pay

## 2017-10-26 MED ORDER — FUROSEMIDE 40 MG PO TABS: 40.0000 mg | ORAL_TABLET | Freq: Two times a day (BID) | ORAL | 3 refills | Status: AC

## 2017-11-05 ENCOUNTER — Telehealth: Payer: Self-pay | Admitting: Internal Medicine

## 2017-11-05 ENCOUNTER — Encounter (HOSPITAL_BASED_OUTPATIENT_CLINIC_OR_DEPARTMENT_OTHER): Payer: Self-pay | Attending: Internal Medicine

## 2017-11-05 DIAGNOSIS — I509 Heart failure, unspecified: Secondary | ICD-10-CM | POA: Insufficient documentation

## 2017-11-05 DIAGNOSIS — J449 Chronic obstructive pulmonary disease, unspecified: Secondary | ICD-10-CM | POA: Insufficient documentation

## 2017-11-05 DIAGNOSIS — I110 Hypertensive heart disease with heart failure: Secondary | ICD-10-CM | POA: Insufficient documentation

## 2017-11-05 DIAGNOSIS — G4730 Sleep apnea, unspecified: Secondary | ICD-10-CM | POA: Insufficient documentation

## 2017-11-05 DIAGNOSIS — I87333 Chronic venous hypertension (idiopathic) with ulcer and inflammation of bilateral lower extremity: Secondary | ICD-10-CM | POA: Insufficient documentation

## 2017-11-05 DIAGNOSIS — L97811 Non-pressure chronic ulcer of other part of right lower leg limited to breakdown of skin: Secondary | ICD-10-CM | POA: Insufficient documentation

## 2017-11-05 NOTE — Telephone Encounter (Signed)
Spoke with pt. He is requesting samples of Bevespi. Samples have been left up front for pick up. Nothing further was needed.

## 2017-11-17 ENCOUNTER — Ambulatory Visit (HOSPITAL_COMMUNITY)
Admission: RE | Admit: 2017-11-17 | Discharge: 2017-11-17 | Disposition: A | Discharge status: Home / Self Care | Payer: Self-pay | Source: Ambulatory Visit | Attending: Vascular Surgery | Admitting: Vascular Surgery

## 2017-11-17 ENCOUNTER — Other Ambulatory Visit (HOSPITAL_BASED_OUTPATIENT_CLINIC_OR_DEPARTMENT_OTHER): Payer: Self-pay | Admitting: Internal Medicine

## 2017-11-17 DIAGNOSIS — L97309 Non-pressure chronic ulcer of unspecified ankle with unspecified severity: Secondary | ICD-10-CM

## 2017-11-17 DIAGNOSIS — Z7901 Long term (current) use of anticoagulants: Secondary | ICD-10-CM | POA: Insufficient documentation

## 2017-11-17 DIAGNOSIS — R936 Abnormal findings on diagnostic imaging of limbs: Secondary | ICD-10-CM | POA: Insufficient documentation

## 2017-11-17 DIAGNOSIS — L97909 Non-pressure chronic ulcer of unspecified part of unspecified lower leg with unspecified severity: Secondary | ICD-10-CM | POA: Insufficient documentation

## 2018-01-03 ENCOUNTER — Encounter (HOSPITAL_BASED_OUTPATIENT_CLINIC_OR_DEPARTMENT_OTHER): Payer: Self-pay | Attending: Internal Medicine

## 2018-01-03 DIAGNOSIS — Z86711 Personal history of pulmonary embolism: Secondary | ICD-10-CM | POA: Insufficient documentation

## 2018-01-03 DIAGNOSIS — I110 Hypertensive heart disease with heart failure: Secondary | ICD-10-CM | POA: Insufficient documentation

## 2018-01-03 DIAGNOSIS — I5032 Chronic diastolic (congestive) heart failure: Secondary | ICD-10-CM | POA: Insufficient documentation

## 2018-01-03 DIAGNOSIS — L309 Dermatitis, unspecified: Secondary | ICD-10-CM | POA: Insufficient documentation

## 2018-01-03 DIAGNOSIS — J449 Chronic obstructive pulmonary disease, unspecified: Secondary | ICD-10-CM | POA: Insufficient documentation

## 2018-01-03 DIAGNOSIS — G4730 Sleep apnea, unspecified: Secondary | ICD-10-CM | POA: Insufficient documentation

## 2018-01-03 DIAGNOSIS — I87323 Chronic venous hypertension (idiopathic) with inflammation of bilateral lower extremity: Secondary | ICD-10-CM | POA: Insufficient documentation

## 2018-01-03 DIAGNOSIS — I890 Lymphedema, not elsewhere classified: Secondary | ICD-10-CM | POA: Insufficient documentation

## 2018-01-21 ENCOUNTER — Ambulatory Visit (INDEPENDENT_AMBULATORY_CARE_PROVIDER_SITE_OTHER): Payer: Self-pay | Admitting: Vascular Surgery

## 2018-01-21 ENCOUNTER — Other Ambulatory Visit: Payer: Self-pay

## 2018-01-21 ENCOUNTER — Encounter: Payer: Self-pay | Admitting: Vascular Surgery

## 2018-01-21 VITALS — BP 142/85 | HR 62 | Temp 97.2°F | Resp 16 | Ht 72.5 in | Wt 305.0 lb

## 2018-01-21 DIAGNOSIS — I872 Venous insufficiency (chronic) (peripheral): Secondary | ICD-10-CM

## 2018-01-21 NOTE — Progress Notes (Signed)
Patient ID: Kirk Lucas Goyal, male   DOB: October 08, 1960, 57 y.o.   MRN: [id number]  Reason for Consult: New Patient (Initial Visit) (bilateral ankle ulcers)   Referred by Maxwell Caulobson, Michael G, MD  Subjective:     HPI:  Kirk Lucas Detloff is a 57 y.o. male multiple history of DVT currently on Eliquis.  He is also had wounds on his bilateral lower extremity states that he has been a compression wraps and compressions for at least 6 months.  We have this document back to September.  He currently does not have any wounds.  He does have some itching heaviness to his bilateral lower extremities.  He does walk without limitation.  He is no longer seeing the wound center since his wounds of healed does wear his compression stockings religiously.  Past Medical History:  Diagnosis Date  . CHF (congestive heart failure) (HCC) June 2017   normal LVF by echo  . COPD (chronic obstructive pulmonary disease) (HCC)   . Hypertension   . Morbid obesity (HCC)    BMI 46  . Sleep apnea    by history  . Smoker    Family History  Problem Relation Age of Onset  . Coronary artery disease Father [id number]       MI  . Congestive Heart Failure Father [id number]       died CHF commplications  . Pulmonary disease Father        pulmonary edema per pt  . Coronary artery disease Mother [phone]       MI  . Stroke Mother [id number]       died after CVA  . Diabetes type II Sister        twin sister with DM   Past Surgical History:  Procedure Laterality Date  . arm surgery    . WRIST SURGERY      Short Social History:  Social History   Tobacco Use  . Smoking status: Former Smoker    Packs/day: 1.25    Years: 38.00    Pack years: 47.50    Types: Cigarettes    Last attempt to quit: 09/21/2015    Years since quitting: 2.3  . Smokeless tobacco: Never Used  Substance Use Topics  . Alcohol use: No    Alcohol/week: 0.0 standard drinks    Allergies  Allergen Reactions  . Codeine Nausea Only  . Duricef [Cefadroxil] Itching    Current  Outpatient Medications  Medication Sig Dispense Refill  . apixaban (ELIQUIS) 5 MG TABS tablet Take 5 mg 2 (two) times daily by mouth.    . calcium carbonate (TUMS EX) 750 MG chewable tablet Chew 2 tablets by mouth as needed for heartburn.    . furosemide (LASIX) 40 MG tablet Take 1 tablet (40 mg total) by mouth 2 (two) times daily. Further refills need to come from PCP. 60 tablet 3  . Glycopyrrolate-Formoterol (BEVESPI AEROSPHERE) 9-4.8 MCG/ACT AERO Inhale 2 puffs into the lungs 2 (two) times daily. 3 Inhaler 0  . hydrocortisone cream 1 % Apply 1 application topically as needed (for rosacea).     No current facility-administered medications for this visit.     Review of Systems  Constitutional:  Constitutional negative. HENT: HENT negative.  Eyes: Eyes negative.  Respiratory: Respiratory negative.  Cardiovascular: Positive for leg swelling.  GI: Gastrointestinal negative.  Skin: Positive for rash.  Neurological: Neurological negative. Psychiatric: Psychiatric negative.        Objective:  Objective   Vitals:   01/21/18 1125  BP: (!) 142/85  Pulse: 62  Resp: 16  Temp: (!) 97.2 F (36.2 C)  TempSrc: Oral  SpO2: 99%  Weight: (!) 305 lb (138.3 kg)  Height: 6' 0.5" (1.842 m)   Body mass index is 40.8 kg/m.  Physical Exam  Constitutional: He is oriented to person, place, and time. He appears well-developed.  HENT:  Head: Normocephalic.  Eyes: Pupils are equal, round, and reactive to light.  Neck: Normal range of motion.  Cardiovascular: Normal rate.  Pulses:      Radial pulses are 2+ on the right side, and 2+ on the left side.       Dorsalis pedis pulses are 1+ on the right side, and 1+ on the left side.  Pulmonary/Chest: Effort normal.  Abdominal: Soft.  Musculoskeletal: He exhibits edema.  Varicosities bilateral medial legs  Neurological: He is alert and oriented to person, place, and time.  Skin:  Skin changes consistent with C4 venous disease bilateral ankles,  no ulcers are present  Psychiatric: He has a normal mood and affect. His behavior is normal. Judgment and thought content normal.    Data: I reviewed his previous venous insufficiency studies which demonstrate large and insufficient greater saphenous veins bilaterally.  On the right they measure up to 1.34 cm at the junction on the left 1.27 cm at the junction and reflux is greater than 2000 ms in both area     Assessment/Plan:     57 year old male with C5 venous disease having healed ulcers on the right leg and has skin changes throughout with insufficient greater saphenous veins and moderate varicosities on his bilateral legs.  He has been compression documented back to September.  We will get him to follow-up in 1 month for consideration of venous ablation and possible stab phlebectomy at that time.  I discussed this with him and he demonstrates good understanding.     Maeola Harman MD [facility]

## 2018-02-09 ENCOUNTER — Encounter: Payer: Self-pay | Admitting: Family Medicine

## 2018-02-09 ENCOUNTER — Ambulatory Visit (INDEPENDENT_AMBULATORY_CARE_PROVIDER_SITE_OTHER): Payer: Self-pay | Admitting: Family Medicine

## 2018-02-09 VITALS — BP 115/68 | HR 71 | Temp 98.0°F | Resp 16 | Ht 72.5 in | Wt 307.0 lb

## 2018-02-09 DIAGNOSIS — Z23 Encounter for immunization: Secondary | ICD-10-CM

## 2018-02-09 DIAGNOSIS — J441 Chronic obstructive pulmonary disease with (acute) exacerbation: Secondary | ICD-10-CM

## 2018-02-09 MED ORDER — IPRATROPIUM-ALBUTEROL 0.5-2.5 (3) MG/3ML IN SOLN: 3.0000 mL | Freq: Four times a day (QID) | RESPIRATORY_TRACT | 2 refills | Status: DC | PRN

## 2018-02-09 MED ORDER — ALBUTEROL SULFATE (2.5 MG/3ML) 0.083% IN NEBU
2.5000 mg | INHALATION_SOLUTION | Freq: Once | RESPIRATORY_TRACT | Status: AC
  Administered 2018-02-09: 2.5 mg via RESPIRATORY_TRACT

## 2018-02-09 MED ORDER — IPRATROPIUM BROMIDE 0.02 % IN SOLN
0.5000 mg | Freq: Once | RESPIRATORY_TRACT | Status: AC
  Administered 2018-02-09: 0.5 mg via RESPIRATORY_TRACT

## 2018-02-09 NOTE — Patient Instructions (Signed)
Chronic Obstructive Pulmonary Disease Chronic obstructive pulmonary disease (COPD) is a long-term (chronic) lung problem. When you have COPD, it is hard for air to get in and out of your lungs. The way your lungs work will never return to normal. Usually the condition gets worse over time. There are things you can do to keep yourself as healthy as possible. Your doctor may treat your condition with:  Medicines.  Quitting smoking, if you smoke.  Rehabilitation. This may involve a team of specialists.  Oxygen.  Exercise and changes to your diet.  Lung surgery.  Comfort measures (palliative care).  Follow these instructions at home: Medicines  Take over-the-counter and prescription medicines only as told by your doctor.  Talk to your doctor before taking any cough or allergy medicines. You may need to avoid medicines that cause your lungs to be dry. Lifestyle  If you smoke, stop. Smoking makes the problem worse. If you need help quitting, ask your doctor.  Avoid being around things that make your breathing worse. This may include smoke, chemicals, and fumes.  Stay active, but remember to also rest.  Learn and use tips on how to relax.  Make sure you get enough sleep. Most adults need at least 7 hours a night.  Eat healthy foods. Eat smaller meals more often. Rest before meals. Controlled breathing  Learn and use tips on how to control your breathing as told by your doctor. Try: ? Breathing in (inhaling) through your nose for 1 second. Then, pucker your lips and breath out (exhale) through your lips for 2 seconds. ? Putting one hand on your belly (abdomen). Breathe in slowly through your nose for 1 second. Your hand on your belly should move out. Pucker your lips and breathe out slowly through your lips. Your hand on your belly should move in as you breathe out. Controlled coughing  Learn and use controlled coughing to clear mucus from your lungs. The steps are: 1. Lean your  head a little forward. 2. Breathe in deeply. 3. Try to hold your breath for 3 seconds. 4. Keep your mouth slightly open while coughing 2 times. 5. Spit any mucus out into a tissue. 6. Rest and do the steps again 1 or 2 times as needed. General instructions  Make sure you get all the shots (vaccines) that your doctor recommends. Ask your doctor about a flu shot and a pneumonia shot.  Use oxygen therapy and therapy to help improve your lungs (pulmonary rehabilitation) if told by your doctor. If you need home oxygen therapy, ask your doctor if you should buy a tool to measure your oxygen level (oximeter).  Make a COPD action plan with your doctor. This helps you know what to do if you feel worse than usual.  Manage any other conditions you have as told by your doctor.  Avoid going outside when it is very hot, cold, or humid.  Avoid people who have a sickness you can catch (contagious).  Keep all follow-up visits as told by your doctor. This is important. Contact a doctor if:  You cough up more mucus than usual.  There is a change in the color or thickness of the mucus.  It is harder to breathe than usual.  Your breathing is faster than usual.  You have trouble sleeping.  You need to use your medicines more often than usual.  You have trouble doing your normal activities such as getting dressed or walking around the house. Get help right away if:    You have shortness of breath while resting.  You have shortness of breath that stops you from: ? Being able to talk. ? Doing normal activities.  Your chest hurts for longer than 5 minutes.  Your skin color is more blue than usual.  Your pulse oximeter shows that you have low oxygen for longer than 5 minutes.  You have a fever.  You feel too tired to breathe normally. Summary  Chronic obstructive pulmonary disease (COPD) is a long-term lung problem.  The way your lungs work will never return to normal. Usually the  condition gets worse over time. There are things you can do to keep yourself as healthy as possible.  Take over-the-counter and prescription medicines only as told by your doctor.  If you smoke, stop. Smoking makes the problem worse. This information is not intended to replace advice given to you by your health care provider. Make sure you discuss any questions you have with your health care provider. Document Released: 08/05/2007 Document Revised: 07/25/2015 Document Reviewed: 10/13/2012 Elsevier Interactive Patient Education  2017 Elsevier Inc. Health Maintenance, Male A healthy lifestyle and preventive care is important for your health and wellness. Ask your health care provider about what schedule of regular examinations is right for you. What should I know about weight and diet? Eat a Healthy Diet  Eat plenty of vegetables, fruits, whole grains, low-fat dairy products, and lean protein.  Do not eat a lot of foods high in solid fats, added sugars, or salt.  Maintain a Healthy Weight Regular exercise can help you achieve or maintain a healthy weight. You should:  Do at least 150 minutes of exercise each week. The exercise should increase your heart rate and make you sweat (moderate-intensity exercise).  Do strength-training exercises at least twice a week.  Watch Your Levels of Cholesterol and Blood Lipids  Have your blood tested for lipids and cholesterol every 5 years starting at 55 years of age. If you are at high risk for heart disease, you should start having your blood tested when you are 55 years old. You may need to have your cholesterol levels checked more often if: ? Your lipid or cholesterol levels are high. ? You are older than 55 years of age. ? You are at high risk for heart disease.  What should I know about cancer screening? Many types of cancers can be detected early and may often be prevented. Lung Cancer  You should be screened every year for lung cancer  if: ? You are a current smoker who has smoked for at least 30 years. ? You are a former smoker who has quit within the past 15 years.  Talk to your health care provider about your screening options, when you should start screening, and how often you should be screened.  Colorectal Cancer  Routine colorectal cancer screening usually begins at 55 years of age and should be repeated every 5-10 years until you are 55 years old. You may need to be screened more often if early forms of precancerous polyps or small growths are found. Your health care provider may recommend screening at an earlier age if you have risk factors for colon cancer.  Your health care provider may recommend using home test kits to check for hidden blood in the stool.  A small camera at the end of a tube can be used to examine your colon (sigmoidoscopy or colonoscopy). This checks for the earliest forms of colorectal cancer.  Prostate and Testicular Cancer  Depending on your age and overall health, your health care provider may do certain tests to screen for prostate and testicular cancer.  Talk to your health care provider about any symptoms or concerns you have about testicular or prostate cancer.  Skin Cancer  Check your skin from head to toe regularly.  Tell your health care provider about any new moles or changes in moles, especially if: ? There is a change in a mole's size, shape, or color. ? You have a mole that is larger than a pencil eraser.  Always use sunscreen. Apply sunscreen liberally and repeat throughout the day.  Protect yourself by wearing long sleeves, pants, a wide-brimmed hat, and sunglasses when outside.  What should I know about heart disease, diabetes, and high blood pressure?  If you are 84-64 years of age, have your blood pressure checked every 3-5 years. If you are 61 years of age or older, have your blood pressure checked every year. You should have your blood pressure measured  twice-once when you are at a hospital or clinic, and once when you are not at a hospital or clinic. Record the average of the two measurements. To check your blood pressure when you are not at a hospital or clinic, you can use: ? An automated blood pressure machine at a pharmacy. ? A home blood pressure monitor.  Talk to your health care provider about your target blood pressure.  If you are between 47-67 years old, ask your health care provider if you should take aspirin to prevent heart disease.  Have regular diabetes screenings by checking your fasting blood sugar level. ? If you are at a normal weight and have a low risk for diabetes, have this test once every three years after the age of 85. ? If you are overweight and have a high risk for diabetes, consider being tested at a younger age or more often.  A one-time screening for abdominal aortic aneurysm (AAA) by ultrasound is recommended for men aged 65-75 years who are current or former smokers. What should I know about preventing infection? Hepatitis B If you have a higher risk for hepatitis B, you should be screened for this virus. Talk with your health care provider to find out if you are at risk for hepatitis B infection. Hepatitis C Blood testing is recommended for:  Everyone born from [birth year] through 1963.  Anyone with known risk factors for hepatitis C.  Sexually Transmitted Diseases (STDs)  You should be screened each year for STDs including gonorrhea and chlamydia if: ? You are sexually active and are younger than 55 years of age. ? You are older than 55 years of age and your health care provider tells you that you are at risk for this type of infection. ? Your sexual activity has changed since you were last screened and you are at an increased risk for chlamydia or gonorrhea. Ask your health care provider if you are at risk.  Talk with your health care provider about whether you are at high risk of being infected with HIV.  Your health care provider may recommend a prescription medicine to help prevent HIV infection.  What else can I do?  Schedule regular health, dental, and eye exams.  Stay current with your vaccines (immunizations).  Do not use any tobacco products, such as cigarettes, chewing tobacco, and e-cigarettes. If you need help quitting, ask your health care provider.  Limit alcohol intake to no more than 2 drinks per day. One drink  equals 12 ounces of beer, 5 ounces of wine, or 1 ounces of hard liquor.  Do not use street drugs.  Do not share needles.  Ask your health care provider for help if you need support or information about quitting drugs.  Tell your health care provider if you often feel depressed.  Tell your health care provider if you have ever been abused or do not feel safe at home. This information is not intended to replace advice given to you by your health care provider. Make sure you discuss any questions you have with your health care provider. Document Released: 08/15/2007 Document Revised: 10/16/2015 Document Reviewed: 11/20/2014 Elsevier Interactive Patient Education  [employer].

## 2018-02-09 NOTE — Progress Notes (Signed)
Established Patient Office Visit  Subjective:  Patient ID: Kirk Lucas, male    DOB: 11/28/1960  Age: 57 y.o. MRN: [id number]  CC:  Chief Complaint  Patient presents with  . COPD    HPI [place]is presents for follow up on chronic conditions. Patient states that he cannot afford the BEVESPI that has been prescribed for his COPD. Patient states that he has asked for samples in the past and they were provided. He states that they do not last very long and he does not like to ask for them. Has been using a friend's COPD medication. Unsure of the names. Patient states that he is SOB and has low oxygen which he states is his baseline.  Smoking cessation- stopped smoking a year ago.  Patient states that he had swelling of his lower extremities for several years. States that he has an appointment with vascular and vein for further evaluation. States that he does have relief with compression stockings  Past Medical History:  Diagnosis Date  . CHF (congestive heart failure) (HCC) June 2017   normal LVF by echo  . COPD (chronic obstructive pulmonary disease) (HCC)   . Hypertension   . Morbid obesity (HCC)    BMI 46  . Sleep apnea    by history  . Smoker     Past Surgical History:  Procedure Laterality Date  . arm surgery    . WRIST SURGERY      Family History  Problem Relation Age of Onset  . Coronary artery disease Father 73       MI  . Congestive Heart Failure Father 68       died CHF commplications  . Pulmonary disease Father        pulmonary edema per pt  . Coronary artery disease Mother 83       MI  . Stroke Mother 35       died after CVA  . Diabetes type II Sister        twin sister with DM    Social History   Socioeconomic History  . Marital status: Single    Spouse name: Not on file  . Number of children: Not on file  . Years of education: Not on file  . Highest education level: Not on file  Occupational History  . Not on file  Social Needs  .  Financial resource strain: Not on file  . Food insecurity:    Worry: Not on file    Inability: Not on file  . Transportation needs:    Medical: Not on file    Non-medical: Not on file  Tobacco Use  . Smoking status: Former Smoker    Packs/day: 1.25    Years: 38.00    Pack years: 47.50    Types: Cigarettes    Last attempt to quit: 09/21/2015    Years since quitting: 2.3  . Smokeless tobacco: Never Used  Substance and Sexual Activity  . Alcohol use: No    Alcohol/week: 0.0 standard drinks  . Drug use: No  . Sexual activity: Not on file  Lifestyle  . Physical activity:    Days per week: Not on file    Minutes per session: Not on file  . Stress: Not on file  Relationships  . Social connections:    Talks on phone: Not on file    Gets together: Not on file    Attends religious service: Not on file    Active member of  club or organization: Not on file    Attends meetings of clubs or organizations: Not on file    Relationship status: Not on file  . Intimate partner violence:    Fear of current or ex partner: Not on file    Emotionally abused: Not on file    Physically abused: Not on file    Forced sexual activity: Not on file  Other Topics Concern  . Not on file  Social History Narrative   Single, lives with his sister. Currently unemployed, he has worked in [employer] in the past.    Outpatient Medications Prior to Visit  Medication Sig Dispense Refill  . apixaban (ELIQUIS) 5 MG TABS tablet Take 5 mg 2 (two) times daily by mouth.    . calcium carbonate (TUMS EX) 750 MG chewable tablet Chew 2 tablets by mouth as needed for heartburn.    . furosemide (LASIX) 40 MG tablet Take 1 tablet (40 mg total) by mouth 2 (two) times daily. Further refills need to come from PCP. 60 tablet 3  . Glycopyrrolate-Formoterol (BEVESPI AEROSPHERE) 9-4.8 MCG/ACT AERO Inhale 2 puffs into the lungs 2 (two) times daily. 3 Inhaler 0  . hydrocortisone cream 1 % Apply 1 application topically  as needed (for rosacea).     No facility-administered medications prior to visit.     Allergies  Allergen Reactions  . Codeine Nausea Only  . Duricef [Cefadroxil] Itching    ROS Review of Systems  Constitutional: Negative.   HENT: Negative.   Eyes: Negative.   Respiratory: Positive for shortness of breath and wheezing.   Cardiovascular: Positive for leg swelling (base line).  Gastrointestinal: Negative.   Endocrine: Negative.   Genitourinary: Negative.   Musculoskeletal: Negative.   Skin: Negative.   Allergic/Immunologic: Negative.   Neurological: Negative.   Hematological: Negative.   Psychiatric/Behavioral: Negative.       Objective:    Physical Exam  Constitutional: He is oriented to person, place, and time. He appears well-developed and well-nourished. No distress.  HENT:  Head: Normocephalic and atraumatic.  Eyes: Pupils are equal, round, and reactive to light. Conjunctivae and EOM are normal.  Neck: Normal range of motion.  Cardiovascular: Normal rate, regular rhythm and normal heart sounds.  Pulmonary/Chest: Effort normal. No respiratory distress. He has decreased breath sounds in the right lower field and the left lower field. He has wheezes (very mild expiratory. ). He has no rhonchi. He has no rales.  Musculoskeletal: Normal range of motion. He exhibits edema (mild non pitting edema noted to bilateral lower extremities. ).  Neurological: He is alert and oriented to person, place, and time.  Skin: Skin is warm and dry.  Psychiatric: He has a normal mood and affect. His behavior is normal. Judgment and thought content normal.  Nursing note and vitals reviewed.   BP 115/68 (BP Location: Left Arm, Patient Position: Sitting, Cuff Size: Large)   Pulse 71   Temp 98 F (36.7 C) (Oral)   Resp 16   Ht 6' 0.5" (1.842 m)   Wt (!) 307 lb (139.3 kg)   SpO2 91%   BMI 41.06 kg/m  Wt Readings from Last 3 Encounters:  02/09/18 (!) 307 lb (139.3 kg)  01/21/18 (!) 305  lb (138.3 kg)  09/23/17 (!) 307 lb 12.8 oz (139.6 kg)     There are no preventive care reminders to display for this patient.  There are no preventive care reminders to display for this patient.  Lab Results  Component Value Date   TSH 2.930 03/29/2017   Lab Results  Component Value Date   WBC 11.2 (H) 03/29/2017   HGB 16.4 03/29/2017   HCT 47.6 03/29/2017   MCV 95 03/29/2017   PLT 194 03/29/2017   Lab Results  Component Value Date   NA 143 08/09/2017   K 4.0 08/09/2017   CO2 27 08/09/2017   GLUCOSE 82 08/09/2017   BUN 14 08/09/2017   CREATININE 1.07 08/09/2017   BILITOT 1.1 08/13/2015   ALKPHOS 62 08/13/2015   AST 19 08/13/2015   ALT 17 08/13/2015   PROT 6.7 08/13/2015   ALBUMIN 3.2 (L) 08/13/2015   CALCIUM 9.3 08/09/2017   ANIONGAP 6 05/17/2016   GFR 90.90 01/18/2017   No results found for: CHOL No results found for: HDL No results found for: LDLCALC No results found for: TRIG No results found for: CHOLHDL Lab Results  Component Value Date   HGBA1C 5.6 03/29/2017      Assessment & Plan:   Problem List Items Addressed This Visit      Respiratory   COPD with acute exacerbation (HCC) - Primary   Relevant Medications   ipratropium-albuterol (DUONEB) 0.5-2.5 (3) MG/3ML SOLN   albuterol (PROVENTIL) (2.5 MG/3ML) 0.083% nebulizer solution 2.5 mg (Completed)   ipratropium (ATROVENT) nebulizer solution 0.5 mg (Completed)      Meds ordered this encounter  Medications  . ipratropium-albuterol (DUONEB) 0.5-2.5 (3) MG/3ML SOLN    Sig: Take 3 mLs by nebulization every 6 (six) hours as needed (SOB wheezing).    Dispense:  360 mL    Refill:  2  . albuterol (PROVENTIL) (2.5 MG/3ML) 0.083% nebulizer solution 2.5 mg  . ipratropium (ATROVENT) nebulizer solution 0.5 mg    Advised patient to apply for [facility] and orange card for financial assistance.  Sent duoneb solution to pharmacy as this is more affordable.  Message sent to Dr. Sherene Sires for possible  medication changes due to high cost of medication.  Will follow up with patient in 3 months.   Follow-up: Return in about 3 months (around 05/11/2018).    Mike Gip, FNP

## 2018-02-15 ENCOUNTER — Telehealth: Payer: Self-pay | Admitting: *Deleted

## 2018-02-15 NOTE — Telephone Encounter (Signed)
After speaking with Fabio NeighborsJen Arrington ([facility] office manager) to confirm [facility] policy regarding cost of office surgery for patient's with no insurance, I discussed with [name] that per [facility] policy, he would owe 45% of the list price of the office surgery (endovenous laser ablation) and [facility] would pay 55 % of list price. Advised him that he would be responsible for paying half of the 45% on the day of the procedure and that [facility] would balance bill him for the remainder.  [name] states that he will cancel the office visit with Dr. Darrick PennaFields scheduled for 02-16-2018 and will defer the office surgery (endovenous laser ablation) at this time.   [name] states he would consider the above information and call back if he decides to reschedule.

## 2018-02-16 ENCOUNTER — Ambulatory Visit: Payer: Self-pay | Admitting: Vascular Surgery

## 2018-05-11 ENCOUNTER — Other Ambulatory Visit: Payer: Self-pay

## 2018-05-11 ENCOUNTER — Ambulatory Visit: Payer: Self-pay | Admitting: Family Medicine

## 2018-05-27 ENCOUNTER — Encounter: Payer: Self-pay | Admitting: Family Medicine

## 2018-05-27 ENCOUNTER — Ambulatory Visit (INDEPENDENT_AMBULATORY_CARE_PROVIDER_SITE_OTHER): Payer: Self-pay | Admitting: Family Medicine

## 2018-05-27 ENCOUNTER — Other Ambulatory Visit: Payer: Self-pay

## 2018-05-27 DIAGNOSIS — I5032 Chronic diastolic (congestive) heart failure: Secondary | ICD-10-CM

## 2018-05-27 DIAGNOSIS — J449 Chronic obstructive pulmonary disease, unspecified: Secondary | ICD-10-CM

## 2018-05-27 NOTE — Progress Notes (Signed)
  [facility]  Virtual Visit via Telephone Note  I connected with Kirk Lucas on 05/27/18 at  1:40 PM EDT by telephone and verified that I am speaking with the correct person using two identifiers.   I discussed the limitations, risks, security and privacy concerns of performing an evaluation and management service by telephone and the availability of in person appointments. I also discussed with the patient that there may be a patient responsible charge related to this service. The patient expressed understanding and agreed to proceed.    History of Present Illness: Patient with a history of COPD, CHF and bilateral PE. He states that his breathing has improved. He denies CP, SOB, dizziness or leg swelling.     Observations/Objective: Patient with regular voice tone, rate and rhythm. Speaking calmly and is in no apparent distress.     Assessment and Plan: 1. Chronic diastolic CHF (congestive heart failure) (HCC) No medication changes warranted at the present time.    2. COPD GOLD IV  No medication changes warranted at the present time.   Patient instructed to call his pharmacy for refills as he does not need them at this time.   Follow Up Instructions:    I discussed the assessment and treatment plan with the patient. The patient was provided an opportunity to ask questions and all were answered. The patient agreed with the plan and demonstrated an understanding of the instructions.   The patient was advised to call back or seek an in-person evaluation if the symptoms worsen or if the condition fails to improve as anticipated. We discussed hand washing, using hand sanitizer when soap and water are not available, only going out when absolutely necessary, and social distancing. Explained to patient that he is immunocompromised and will need to take precautions during this time.    I provided 10 minutes of non-face-to-face time during  this encounter.  Ms. Andr L. Riley Lam, FNP-BC [facility] [address] [place], [place] [address] [phone]

## 2018-06-22 ENCOUNTER — Telehealth: Payer: Self-pay

## 2018-06-22 NOTE — Telephone Encounter (Signed)
Called and spoke with patient, he was asking where he could buy a nebulizer machine but has since found one. Thanks!

## 2018-07-29 ENCOUNTER — Telehealth: Payer: Self-pay

## 2018-07-29 NOTE — Telephone Encounter (Signed)
Called and spoke with patient. He is saying the pharmacy is saying his rx is too early to fill for duoneb. I went over directions and he is taking correctly. He says he is going to the pharmacy now and will get them to give [place] a call if there is a problem. Thanks!

## 2018-08-11 ENCOUNTER — Other Ambulatory Visit: Payer: Self-pay | Admitting: Family Medicine

## 2018-08-11 DIAGNOSIS — J441 Chronic obstructive pulmonary disease with (acute) exacerbation: Secondary | ICD-10-CM

## 2018-09-09 ENCOUNTER — Other Ambulatory Visit: Payer: Self-pay | Admitting: Family Medicine

## 2018-09-09 DIAGNOSIS — J441 Chronic obstructive pulmonary disease with (acute) exacerbation: Secondary | ICD-10-CM

## 2018-09-23 ENCOUNTER — Other Ambulatory Visit: Payer: Self-pay | Admitting: Family Medicine

## 2018-09-23 DIAGNOSIS — J441 Chronic obstructive pulmonary disease with (acute) exacerbation: Secondary | ICD-10-CM

## 2018-09-26 ENCOUNTER — Other Ambulatory Visit: Payer: Self-pay

## 2018-09-26 DIAGNOSIS — J441 Chronic obstructive pulmonary disease with (acute) exacerbation: Secondary | ICD-10-CM

## 2018-09-26 MED ORDER — IPRATROPIUM-ALBUTEROL 0.5-2.5 (3) MG/3ML IN SOLN: RESPIRATORY_TRACT | 3 refills | Status: AC

## 2018-11-01 DEATH — deceased

## 2018-11-09 ENCOUNTER — Encounter (HOSPITAL_COMMUNITY): Payer: Self-pay | Admitting: *Deleted

## 2018-11-09 ENCOUNTER — Encounter (HOSPITAL_COMMUNITY): Payer: Self-pay

## 2019-02-05 IMAGING — CT CT CHEST W/O CM
2 of 3 series · 15 of 36 positions shown, 18 images · non-contrast
Comparison: May 12, 2016

CLINICAL DATA: Pulmonary nodule

EXAM:
CT CHEST WITHOUT CONTRAST
TECHNIQUE: Multidetector CT imaging of the chest was performed following the
standard protocol without IV contrast.

[Series 2: thorax · axial · 0.91mm/px · z∈[-148,+136]mm · 12 of 168 slices shown, 15 images]
[im 13/168  mediastinal]
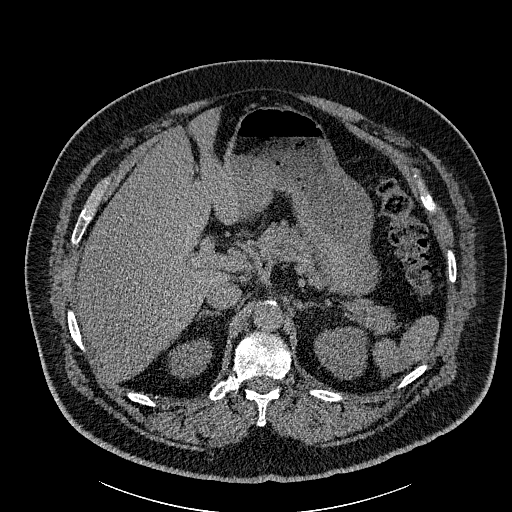
[im 13/168  lung]
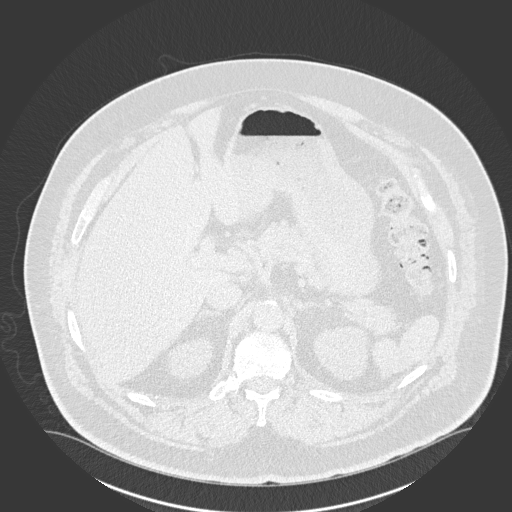
[im 25/168  lung]
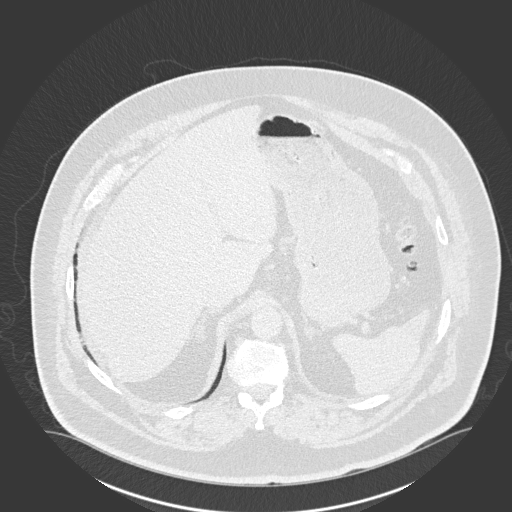
[im 38/168  lung]
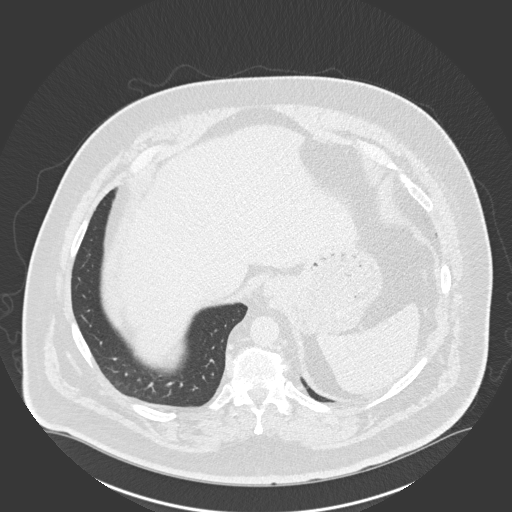
[im 50/168  lung]
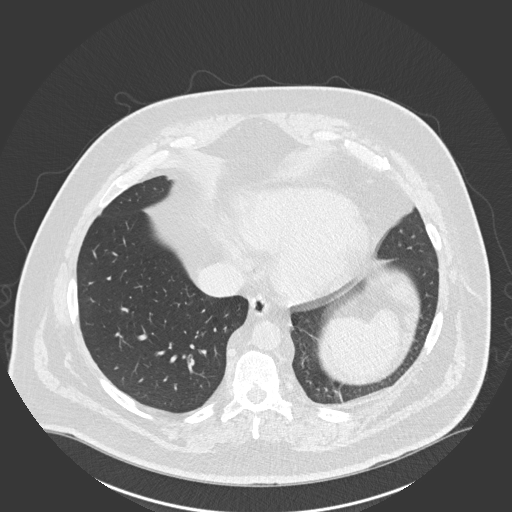
[im 62/168  mediastinal]
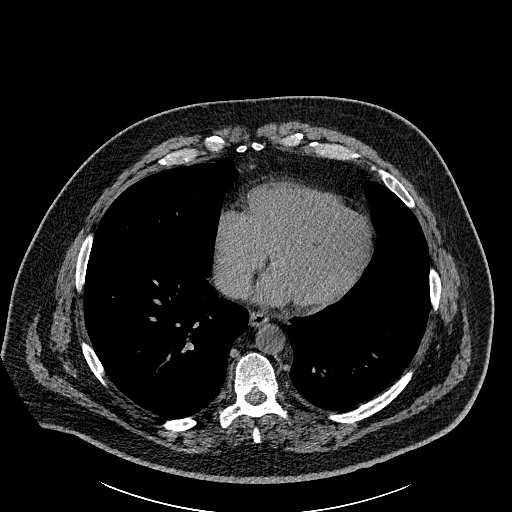
[im 62/168  lung]
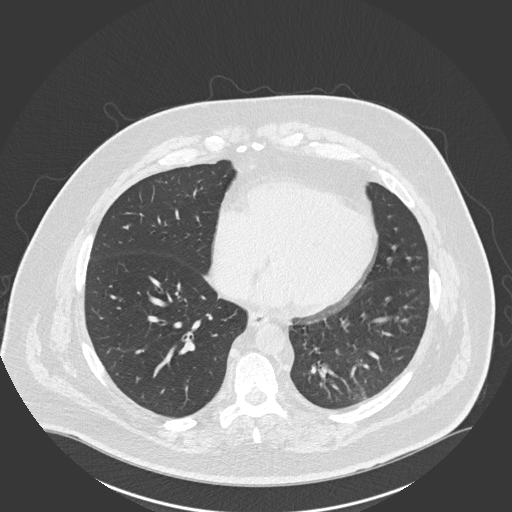
[im 75/168  lung]
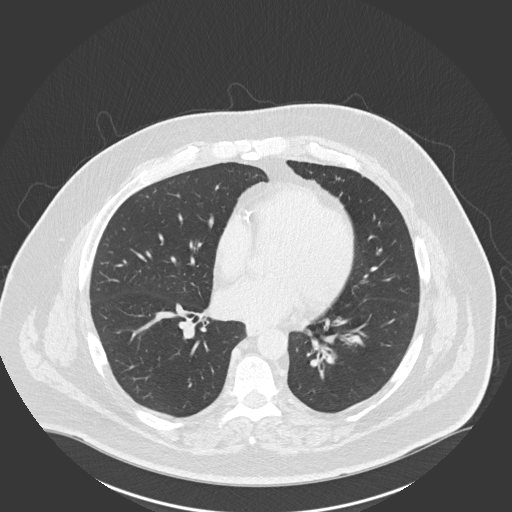
[im 93/168  lung]
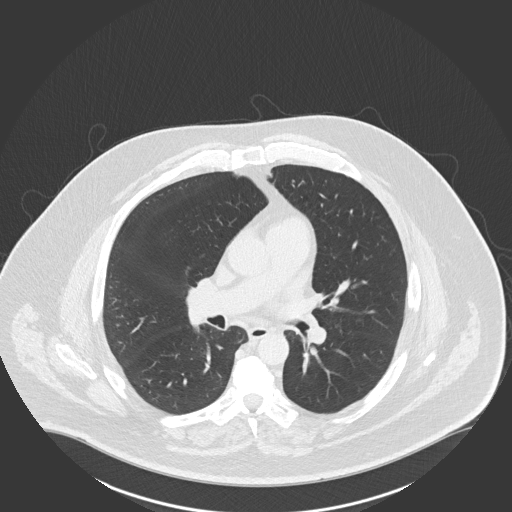
[im 106/168  lung]
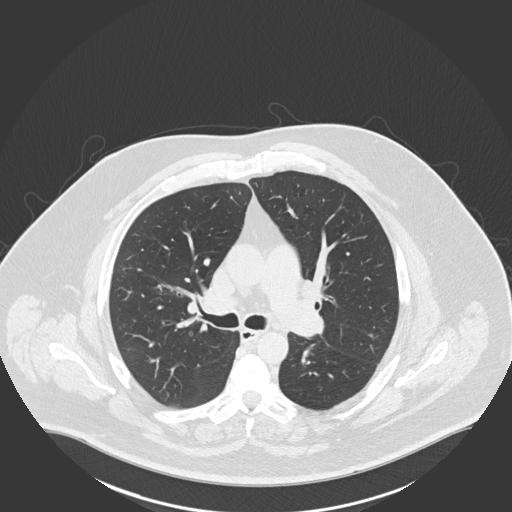
[im 118/168  mediastinal]
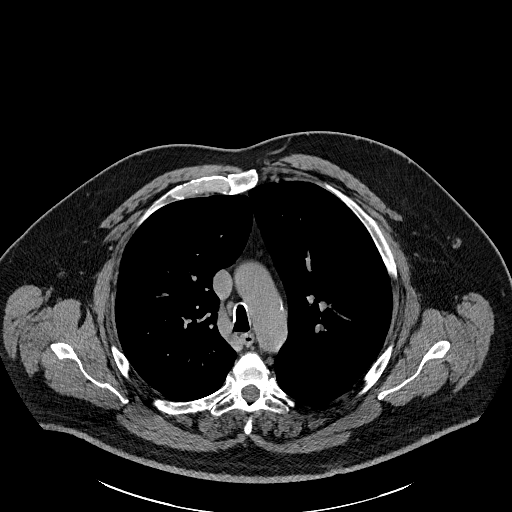
[im 118/168  lung]
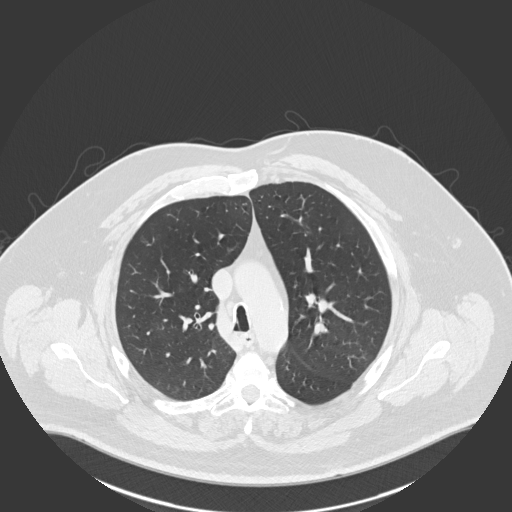
[im 130/168  lung]
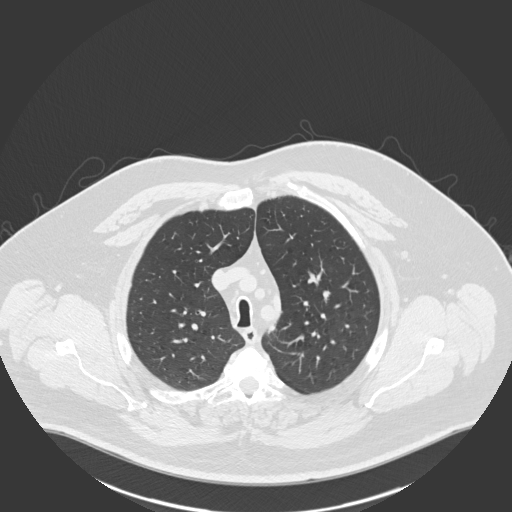
[im 143/168  lung]
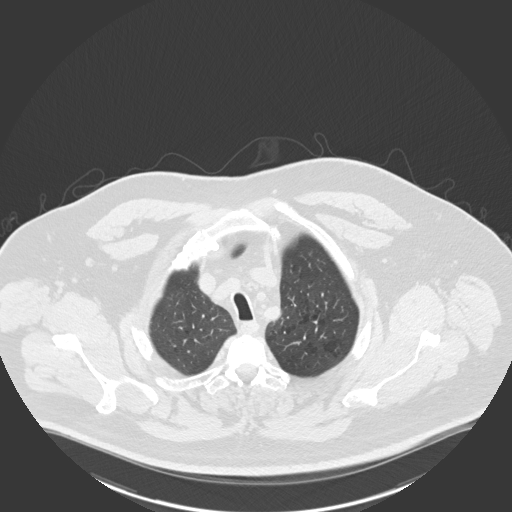
[im 155/168  lung]
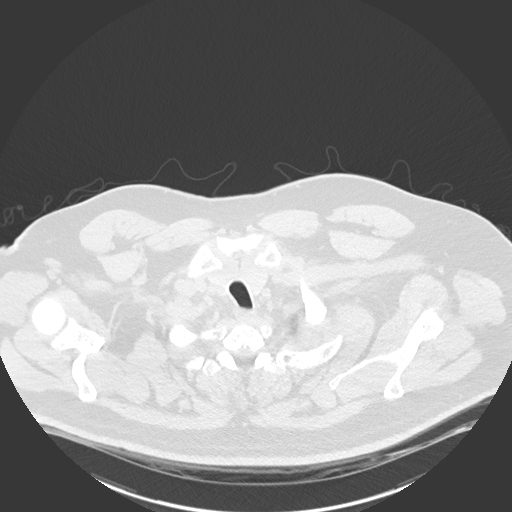

[Series 5: coronal · coronal · 0.68mm/px · 3 of 155 slices shown]
[im 31/155  lung]
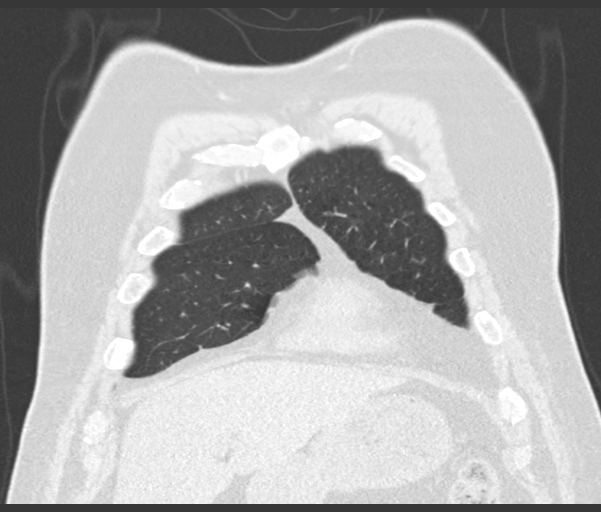
[im 62/155  lung]
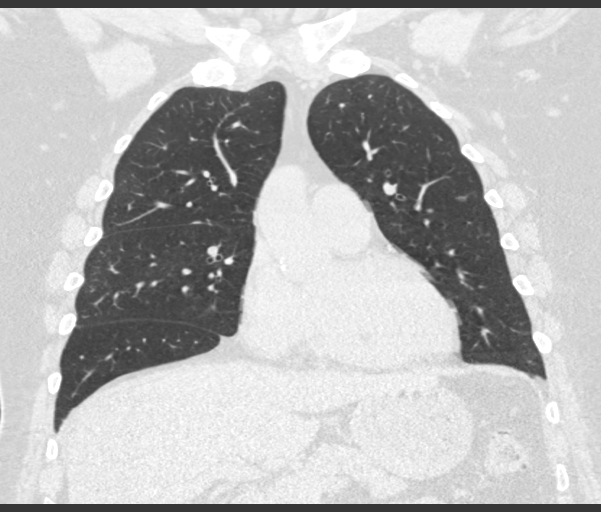
[im 93/155  lung]
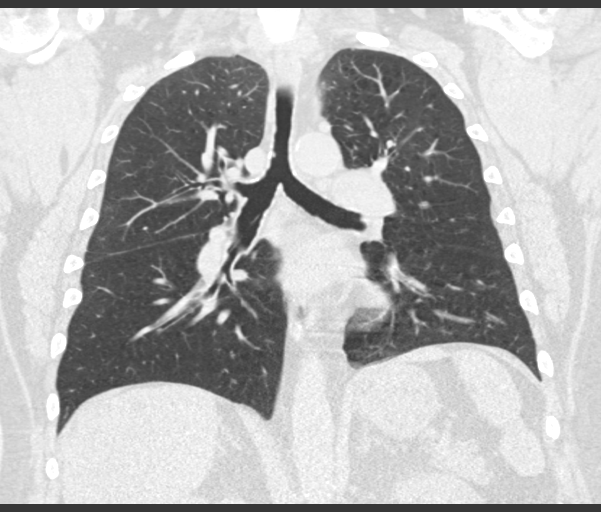

[15 of 36 positions shown; findings below may reference images not displayed]

FINDINGS: Cardiovascular: There is no appreciable thoracic aortic aneurysm.
There are foci of calcification in the aorta. Visualized great
vessels appear unremarkable except for mild calcification at the
origin of the left subclavian artery. There are foci of coronary
artery calcification. Pericardium is not appreciably thickened. The
main pulmonary outflow tract measures 3.5 cm, concerning for a
degree of pulmonary arterial hypertension.

Mediastinum/Nodes: There is a benign-appearing calcification in the
inferior aspect of the left lobe of the thyroid, stable. No thyroid
mass evident. There is no appreciable thoracic adenopathy. Several
subcentimeter lymph nodes are present, not meeting size criteria for
pathologic significance.

Lungs/Pleura: The previously noted nodular opacity in the posterior
segment of the right upper lobe is again noted, currently seen on
axial slice 74 series 3. It measures 7 x 5 mm, unchanged from prior
studies. There is mild scarring in the lung bases. The lungs
elsewhere are clear. There is no edema or consolidation currently.
No pleural effusion or pleural thickening evident.

Upper Abdomen: Visualized upper abdominal structures appear
unremarkable except for atherosclerotic calcification in the aorta.

Musculoskeletal: There are no blastic or lytic bone lesions.
IMPRESSION: Stable 7 x 5 mm nodular opacity posterior segment right upper lobe.
This lesion abuts the minor fissure and may represent a fissure
lymph node. No new pulmonary nodular lesion. Mild scarring in the
left base.

There is aortic and great vessel atherosclerosis. Foci of coronary
artery calcification evident.

Main pulmonary outflow tract measures 3.5 cm, concerning for a
degree of pulmonary arterial hypertension.

Scattered subcentimeter mediastinal lymph nodes without adenopathy
by size criteria.

Aortic Atherosclerosis (A9JH9-N9X.X).

## 2019-11-03 ENCOUNTER — Encounter: Payer: Self-pay | Admitting: Cardiovascular Disease

## 2020-01-31 IMAGING — CT CT CHEST W/O CM
2 of 3 series · 14 of 36 positions shown, 17 images · non-contrast
Comparison: 08/17/2016

CLINICAL DATA: Follow follow-up pulmonary nodule, occasional
shortness of breath, COPD, CHF, hypertension, former smoker quit 14
months ago

EXAM:
CT CHEST WITHOUT CONTRAST
TECHNIQUE: Multidetector CT imaging of the chest was performed following the
standard protocol without IV contrast. Sagittal and coronal MPR
images reconstructed from axial data set.

[Series 2: thorax · axial · 0.94mm/px · z∈[-333,-75]mm · 11 of 153 slices shown, 14 images]
[im 12/153  mediastinal]
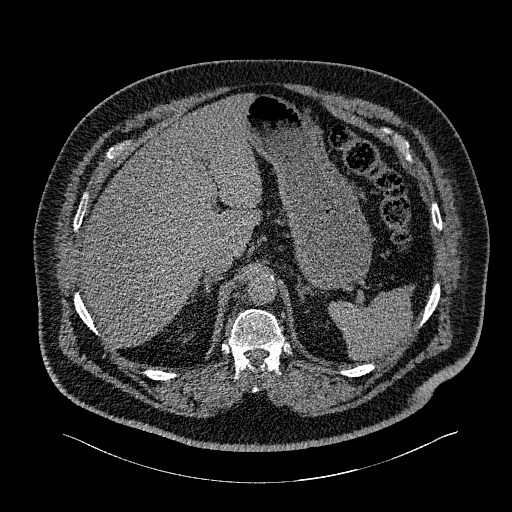
[im 12/153  lung]
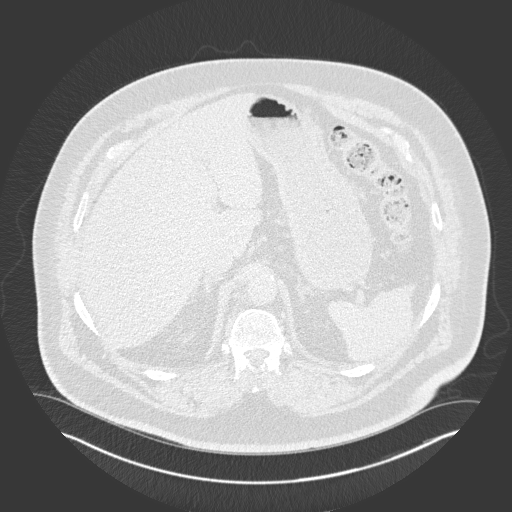
[im 23/153  lung]
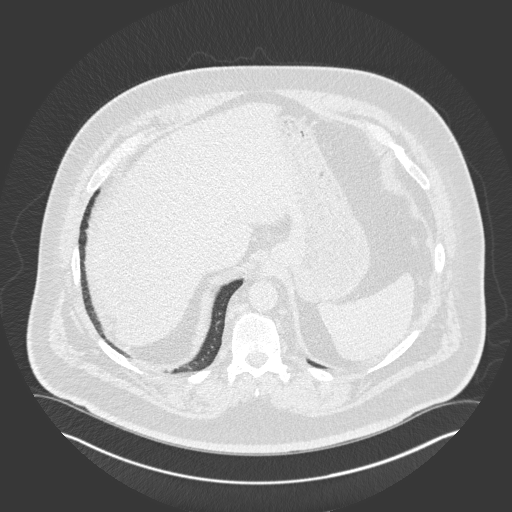
[im 34/153  lung]
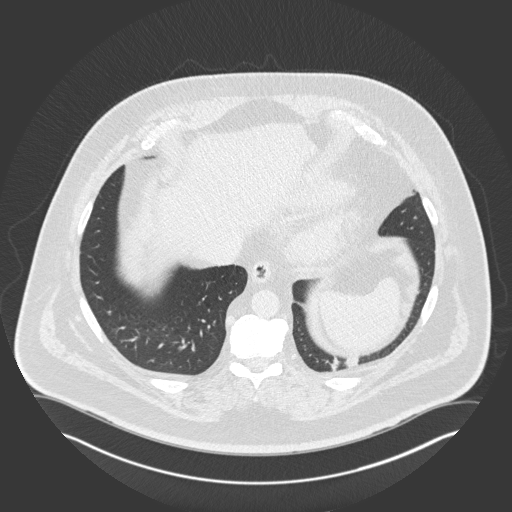
[im 51/153  lung]
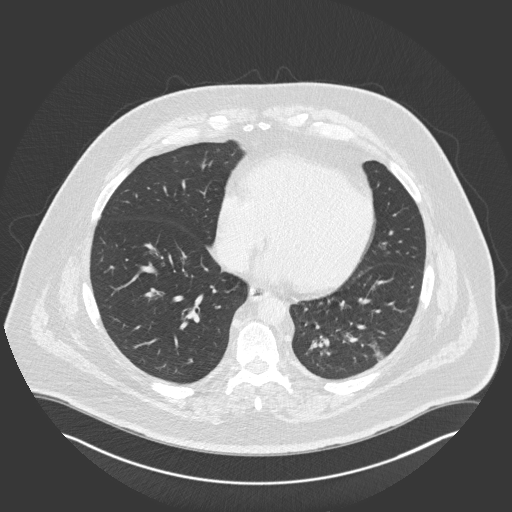
[im 62/153  mediastinal]
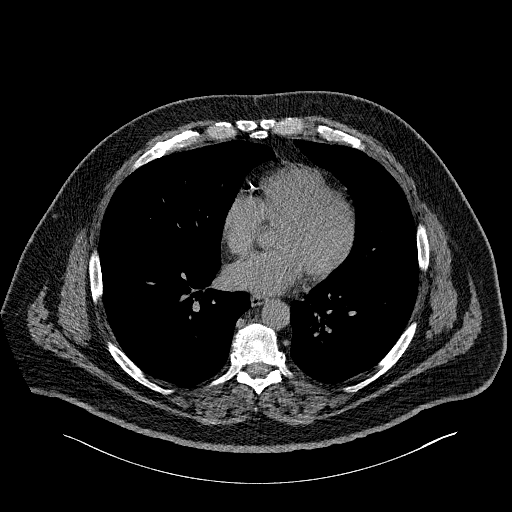
[im 62/153  lung]
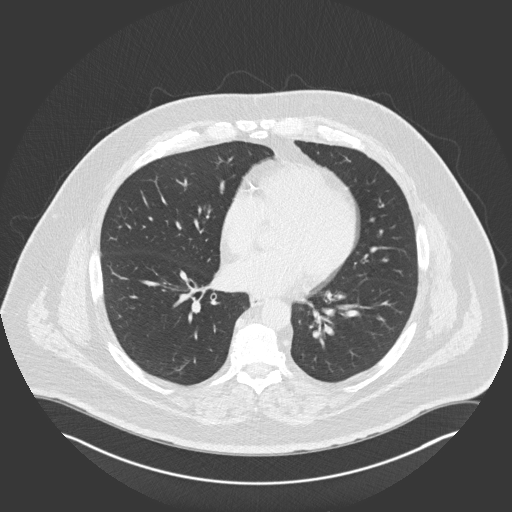
[im 79/153  lung]
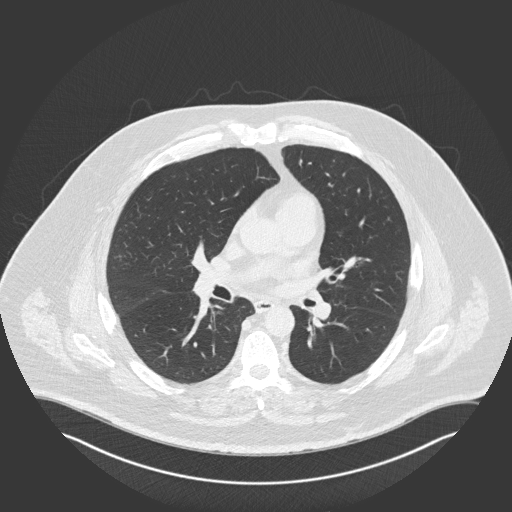
[im 91/153  lung]
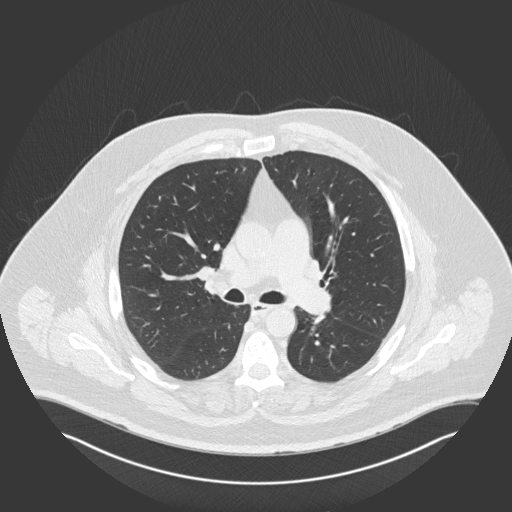
[im 102/153  lung]
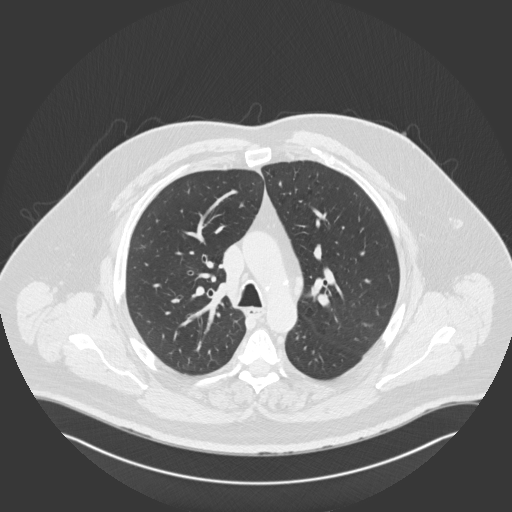
[im 119/153  mediastinal]
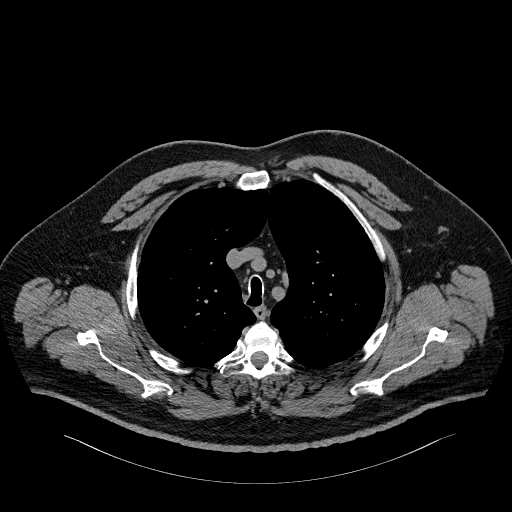
[im 119/153  lung]
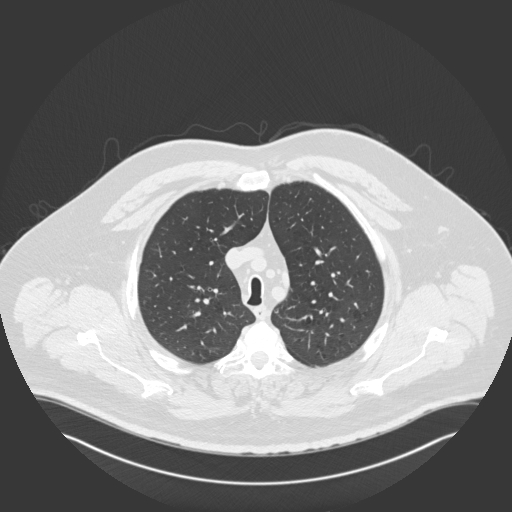
[im 130/153  lung]
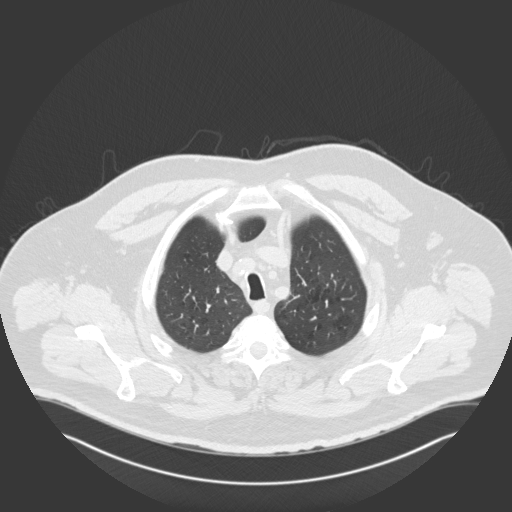
[im 141/153  lung]
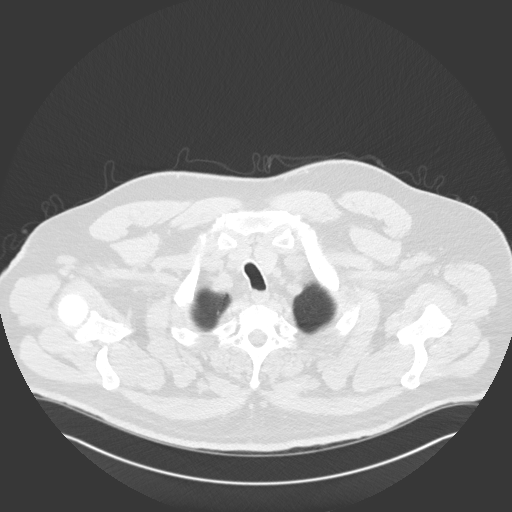

[Series 5: coronal · coronal · 0.62mm/px · 3 of 181 slices shown]
[im 37/181  lung]
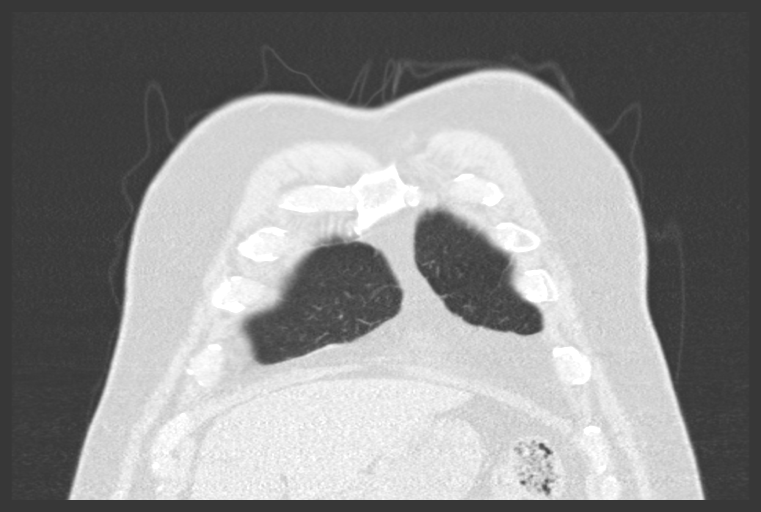
[im 73/181  lung]
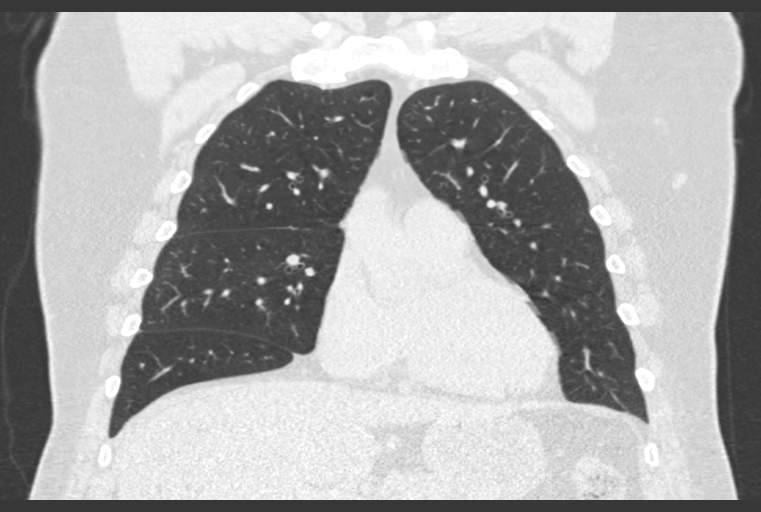
[im 109/181  lung]
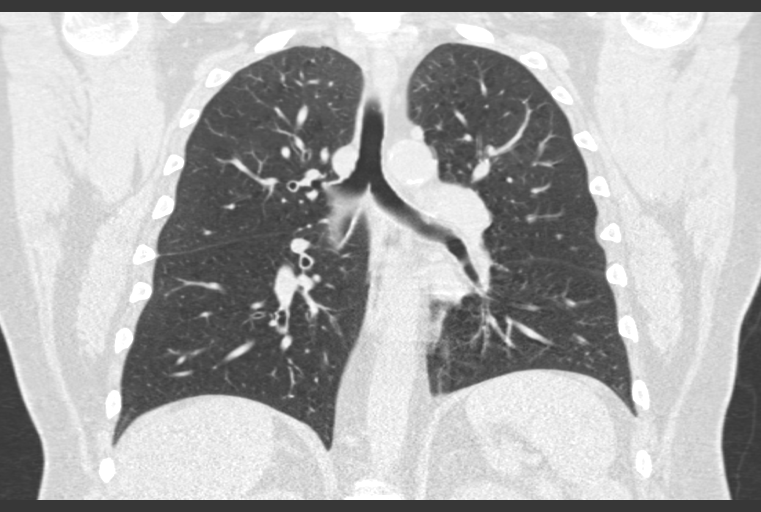

[14 of 36 positions shown; findings below may reference images not displayed]

FINDINGS: Cardiovascular: Atherosclerotic calcifications of aorta, coronary
arteries and proximal great vessels. Upper normal size of cardiac
chambers. No pericardial effusion. Aorta normal caliber. Main
pulmonary artery 3.7 cm transverse question pulmonary arterial
hypertension.

Mediastinum/Nodes: Scattered normal size mediastinal at axillary
lymph nodes. No thoracic adenopathy. Small calcification within the
LEFT thyroid lobe unchanged. Base of cervical region otherwise
normal appearance. Esophagus unremarkable.

Lungs/Pleura: 8 x 5 mm nodular density at minor fissure, stable, by
my measurements. No additional pulmonary mass or nodule.
Emphysematous changes at the upper lobes. Minimal central
peribronchial thickening. Subsegmental atelectasis LEFT lower lobe.
No acute infiltrate, pleural effusion or pneumothorax.

Upper Abdomen: Irregular splenic contour likely scarring question
prior infarct. Remaining visualized upper abdomen unremarkable

Musculoskeletal: No acute osseous findings.
IMPRESSION: Stable 8 x 5 mm nodule at the minor fissure.

Central peribronchial thickening with subsegmental atelectasis in
LEFT lower lobe.

No other intrathoracic abnormalities.

Scattered atherosclerotic calcifications including within the
coronary arteries.

Prominent main pulmonary artery raising question of pulmonary
arterial hypertension.

Aortic Atherosclerosis ([id number]-[id number].V) and Emphysema ([id number]-S30.4).
